# Patient Record
Sex: Female | Born: 1995 | Race: White | Hispanic: No | Smoking: Never smoker
Health system: Southern US, Community
[De-identification: ages and names within clinical notes are randomized; demographics above are authoritative.]

## PROBLEM LIST (undated history)

## (undated) ENCOUNTER — Inpatient Hospital Stay (HOSPITAL_COMMUNITY): Payer: Medicaid Other | Admitting: Obstetrics & Gynecology

## (undated) DIAGNOSIS — M419 Scoliosis, unspecified: Secondary | ICD-10-CM

## (undated) DIAGNOSIS — N200 Calculus of kidney: Secondary | ICD-10-CM

## (undated) DIAGNOSIS — Z3490 Encounter for supervision of normal pregnancy, unspecified, unspecified trimester: Secondary | ICD-10-CM

## (undated) DIAGNOSIS — O039 Complete or unspecified spontaneous abortion without complication: Secondary | ICD-10-CM

## (undated) HISTORY — DX: Scoliosis, unspecified: M41.9

## (undated) HISTORY — DX: Complete or unspecified spontaneous abortion without complication: O03.9

## (undated) HISTORY — PX: NO PAST SURGERIES: SHX2092

## (undated) HISTORY — DX: Encounter for supervision of normal pregnancy, unspecified, unspecified trimester: Z34.90

---
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
## 2015-01-01 ENCOUNTER — Telehealth: Payer: Self-pay

## 2015-01-01 ENCOUNTER — Encounter: Payer: Self-pay | Admitting: Family Medicine

## 2015-01-01 ENCOUNTER — Ambulatory Visit (INDEPENDENT_AMBULATORY_CARE_PROVIDER_SITE_OTHER): Payer: Medicaid Other | Admitting: Family Medicine

## 2015-01-01 VITALS — BP 116/78 | Temp 98.3°F | Ht 60.5 in | Wt 170.1 lb

## 2015-01-01 DIAGNOSIS — N926 Irregular menstruation, unspecified: Secondary | ICD-10-CM

## 2015-01-01 DIAGNOSIS — B359 Dermatophytosis, unspecified: Secondary | ICD-10-CM | POA: Diagnosis not present

## 2015-01-01 DIAGNOSIS — L739 Follicular disorder, unspecified: Secondary | ICD-10-CM

## 2015-01-01 MED ORDER — KETOCONAZOLE 2 % EX CREA
1.0000 | TOPICAL_CREAM | Freq: Two times a day (BID) | CUTANEOUS | Status: AC
Start: 2015-01-01 — End: 2015-01-22

## 2015-01-01 MED ORDER — NAPROXEN 500 MG PO TABS: 500.0000 mg | ORAL_TABLET | Freq: Two times a day (BID) | ORAL | Status: DC

## 2015-01-01 NOTE — Progress Notes (Signed)
   Subjective:    Patient ID: Jody Hansen, female    DOB: Mar 18, 1995, 19 y.o.   MRN: [id number]  Rash This is a recurrent problem. The current episode started more than 1 month ago. The problem has been gradually improving since onset. The affected locations include the left arm. Treatments tried: Hydrocortisone cream. The treatment provided mild relief.   Relates irregular cycles trying to get pregnant Patient would like to discuss pain to back of head, and swelling. She states it did drain a little bit but now seems to be getting better Review of Systems  Skin: Positive for rash.   Patient with some impetigo/folliculitis areas on the back of her head    Objective:   Physical Exam Tinea left elbow lungs clear heart regular back of the head shows healing impetigo no other particular troubles seen   Temperature prediction for ablation discuss with the patient chart given    Assessment & Plan:  Patient relates irregular cycles. I recommend that the patient keep track of her cycles follow-up for female examination patients does not think she has any type of pelvic infection   Patient with impetigo on the back of the head seems to be healing well without medication no need for intervention currently  Patient with tinea daily, Zoloft prescribed

## 2015-01-05 ENCOUNTER — Ambulatory Visit: Payer: Medicaid Other | Admitting: Pediatrics

## 2015-01-19 NOTE — Telephone Encounter (Signed)
error 

## 2015-01-23 ENCOUNTER — Ambulatory Visit (INDEPENDENT_AMBULATORY_CARE_PROVIDER_SITE_OTHER): Payer: Medicaid Other | Admitting: Adult Health

## 2015-01-23 ENCOUNTER — Encounter: Payer: Self-pay | Admitting: Adult Health

## 2015-01-23 VITALS — BP 122/80 | HR 86 | Ht 61.0 in | Wt 172.0 lb

## 2015-01-23 DIAGNOSIS — O3680X0 Pregnancy with inconclusive fetal viability, not applicable or unspecified: Secondary | ICD-10-CM

## 2015-01-23 DIAGNOSIS — N926 Irregular menstruation, unspecified: Secondary | ICD-10-CM

## 2015-01-23 DIAGNOSIS — Z3201 Encounter for pregnancy test, result positive: Secondary | ICD-10-CM

## 2015-01-23 DIAGNOSIS — Z3400 Encounter for supervision of normal first pregnancy, unspecified trimester: Secondary | ICD-10-CM | POA: Insufficient documentation

## 2015-01-23 DIAGNOSIS — Z3490 Encounter for supervision of normal pregnancy, unspecified, unspecified trimester: Secondary | ICD-10-CM

## 2015-01-23 HISTORY — DX: Encounter for supervision of normal pregnancy, unspecified, unspecified trimester: Z34.90

## 2015-01-23 LAB — POCT URINE PREGNANCY: PREG TEST UR: POSITIVE — AB

## 2015-01-23 MED ORDER — CITRANATAL 90 DHA 90-1 & 300 MG PO MISC: ORAL | Status: DC

## 2015-01-23 NOTE — Patient Instructions (Signed)
First Trimester of Pregnancy The first trimester of pregnancy is from week 1 until the end of week 12 (months 1 through 3). A week after a sperm fertilizes an egg, the egg will implant on the wall of the uterus. This embryo will begin to develop into a baby. Genes from you and your partner are forming the baby. The female genes determine whether the baby is a boy or a girl. At 6-8 weeks, the eyes and face are formed, and the heartbeat can be seen on ultrasound. At the end of 12 weeks, all the baby's organs are formed.  Now that you are pregnant, you will want to do everything you can to have a healthy baby. Two of the most important things are to get good prenatal care and to follow your health care provider's instructions. Prenatal care is all the medical care you receive before the baby's birth. This care will help prevent, find, and treat any problems during the pregnancy and childbirth. BODY CHANGES Your body goes through many changes during pregnancy. The changes vary from woman to woman.   You may gain or lose a couple of pounds at first.  You may feel sick to your stomach (nauseous) and throw up (vomit). If the vomiting is uncontrollable, call your health care provider.  You may tire easily.  You may develop headaches that can be relieved by medicines approved by your health care provider.  You may urinate more often. Painful urination may mean you have a bladder infection.  You may develop heartburn as a result of your pregnancy.  You may develop constipation because certain hormones are causing the muscles that push waste through your intestines to slow down.  You may develop hemorrhoids or swollen, bulging veins (varicose veins).  Your breasts may begin to grow larger and become tender. Your nipples may stick out more, and the tissue that surrounds them (areola) may become darker.  Your gums may bleed and may be sensitive to brushing and flossing.  Dark spots or blotches (chloasma,  mask of pregnancy) may develop on your face. This will likely fade after the baby is born.  Your menstrual periods will stop.  You may have a loss of appetite.  You may develop cravings for certain kinds of food.  You may have changes in your emotions from day to day, such as being excited to be pregnant or being concerned that something may go wrong with the pregnancy and baby.  You may have more vivid and strange dreams.  You may have changes in your hair. These can include thickening of your hair, rapid growth, and changes in texture. Some women also have hair loss during or after pregnancy, or hair that feels dry or thin. Your hair will most likely return to normal after your baby is born. WHAT TO EXPECT AT YOUR PRENATAL VISITS During a routine prenatal visit:  You will be weighed to make sure you and the baby are growing normally.  Your blood pressure will be taken.  Your abdomen will be measured to track your baby's growth.  The fetal heartbeat will be listened to starting around week 10 or 12 of your pregnancy.  Test results from any previous visits will be discussed. Your health care provider may ask you:  How you are feeling.  If you are feeling the baby move.  If you have had any abnormal symptoms, such as leaking fluid, bleeding, severe headaches, or abdominal cramping.  If you are using any tobacco products,   including cigarettes, chewing tobacco, and electronic cigarettes.  If you have any questions. Other tests that may be performed during your first trimester include:  Blood tests to find your blood type and to check for the presence of any previous infections. They will also be used to check for low iron levels (anemia) and Rh antibodies. Later in the pregnancy, blood tests for diabetes will be done along with other tests if problems develop.  Urine tests to check for infections, diabetes, or protein in the urine.  An ultrasound to confirm the proper growth  and development of the baby.  An amniocentesis to check for possible genetic problems.  Fetal screens for spina bifida and Down syndrome.  You may need other tests to make sure you and the baby are doing well.  HIV (human immunodeficiency virus) testing. Routine prenatal testing includes screening for HIV, unless you choose not to have this test. HOME CARE INSTRUCTIONS  Medicines  Follow your health care provider's instructions regarding medicine use. Specific medicines may be either safe or unsafe to take during pregnancy.  Take your prenatal vitamins as directed.  If you develop constipation, try taking a stool softener if your health care provider approves. Diet  Eat regular, well-balanced meals. Choose a variety of foods, such as meat or vegetable-based protein, fish, milk and low-fat dairy products, vegetables, fruits, and whole grain breads and cereals. Your health care provider will help you determine the amount of weight gain that is right for you.  Avoid raw meat and uncooked cheese. These carry germs that can cause birth defects in the baby.  Eating four or five small meals rather than three large meals a day may help relieve nausea and vomiting. If you start to feel nauseous, eating a few soda crackers can be helpful. Drinking liquids between meals instead of during meals also seems to help nausea and vomiting.  If you develop constipation, eat more high-fiber foods, such as fresh vegetables or fruit and whole grains. Drink enough fluids to keep your urine clear or pale yellow. Activity and Exercise  Exercise only as directed by your health care provider. Exercising will help you:  Control your weight.  Stay in shape.  Be prepared for labor and delivery.  Experiencing pain or cramping in the lower abdomen or low back is a good sign that you should stop exercising. Check with your health care provider before continuing normal exercises.  Try to avoid standing for long  periods of time. Move your legs often if you must stand in one place for a long time.  Avoid heavy lifting.  Wear low-heeled shoes, and practice good posture.  You may continue to have sex unless your health care provider directs you otherwise. Relief of Pain or Discomfort  Wear a good support bra for breast tenderness.   Take warm sitz baths to soothe any pain or discomfort caused by hemorrhoids. Use hemorrhoid cream if your health care provider approves.   Rest with your legs elevated if you have leg cramps or low back pain.  If you develop varicose veins in your legs, wear support hose. Elevate your feet for 15 minutes, 3-4 times a day. Limit salt in your diet. Prenatal Care  Schedule your prenatal visits by the twelfth week of pregnancy. They are usually scheduled monthly at first, then more often in the last 2 months before delivery.  Write down your questions. Take them to your prenatal visits.  Keep all your prenatal visits as directed by your   health care provider. Safety  Wear your seat belt at all times when driving.  Make a list of emergency phone numbers, including numbers for family, friends, the hospital, and police and fire departments. General Tips  Ask your health care provider for a referral to a local prenatal education class. Begin classes no later than at the beginning of month 6 of your pregnancy.  Ask for help if you have counseling or nutritional needs during pregnancy. Your health care provider can offer advice or refer you to specialists for help with various needs.  Do not use hot tubs, steam rooms, or saunas.  Do not douche or use tampons or scented sanitary pads.  Do not cross your legs for long periods of time.  Avoid cat litter boxes and soil used by cats. These carry germs that can cause birth defects in the baby and possibly loss of the fetus by miscarriage or stillbirth.  Avoid all smoking, herbs, alcohol, and medicines not prescribed by  your health care provider. Chemicals in these affect the formation and growth of the baby.  Do not use any tobacco products, including cigarettes, chewing tobacco, and electronic cigarettes. If you need help quitting, ask your health care provider. You may receive counseling support and other resources to help you quit.  Schedule a dentist appointment. At home, brush your teeth with a soft toothbrush and be gentle when you floss. SEEK MEDICAL CARE IF:   You have dizziness.  You have mild pelvic cramps, pelvic pressure, or nagging pain in the abdominal area.  You have persistent nausea, vomiting, or diarrhea.  You have a bad smelling vaginal discharge.  You have pain with urination.  You notice increased swelling in your face, hands, legs, or ankles. SEEK IMMEDIATE MEDICAL CARE IF:   You have a fever.  You are leaking fluid from your vagina.  You have spotting or bleeding from your vagina.  You have severe abdominal cramping or pain.  You have rapid weight gain or loss.  You vomit blood or material that looks like coffee grounds.  You are exposed to [place] measles and have never had them.  You are exposed to fifth disease or chickenpox.  You develop a severe headache.  You have shortness of breath.  You have any kind of trauma, such as from a fall or a car accident.   This information is not intended to replace advice given to you by your health care provider. Make sure you discuss any questions you have with your health care provider.   Document Released: 02/15/2001 Document Revised: 03/14/2014 Document Reviewed: 01/01/2013 Elsevier Interactive Patient Education [employer]. Return in 2 weeks for dating [place] Take prenatals

## 2015-01-23 NOTE — Progress Notes (Signed)
Subjective:     Patient ID: Jody Hansen, female   DOB: 05/02/1995, 19 y.o.   MRN: [id number]  HPI Jody Hansen is a 19 year old white female,in for having missed a period and had +HPT,has some cramping,no bleeding or nausea or vomiting,but breasts are tender.She wants confirming UPT.  Review of Systems Patient denies any headaches, hearing loss, fatigue, blurred vision, shortness of breath, chest pain,  problems with bowel movements, urination, or intercourse. No joint pain or mood swings. See HPI for positives. Reviewed past medical,surgical, social and family history. Reviewed medications and allergies.     Objective:   Physical Exam BP 122/80 mmHg  Pulse 86  Ht 5\' 1"  (1.549 m)  Wt 172 lb (78.019 kg)  BMI 32.52 kg/m2  LMP 12/16/2014 UPT + about 5+3 weeks by LMP with EDD 09/22/15, medicaid form given, Skin warm and dry. Neck: mid line trachea, normal thyroid, good ROM, no lymphadenopathy noted. Lungs: clear to ausculation bilaterally. Cardiovascular: regular rate and rhythm.Abdomen soft and non tender.   Has rash inner left elbow ?ring worm has cream per Dr Wolfgang Phoenix.  Assessment:     Pregnant     Plan:     Rx citranatal 90 DHA #60 take 1 bid with 12 refills Return in 2 weeks for dating [place] Review handout on first trimester

## 2015-01-27 ENCOUNTER — Ambulatory Visit: Payer: Medicaid Other | Admitting: Nurse Practitioner

## 2015-02-06 ENCOUNTER — Ambulatory Visit (INDEPENDENT_AMBULATORY_CARE_PROVIDER_SITE_OTHER): Payer: Medicaid Other

## 2015-02-06 DIAGNOSIS — O3680X0 Pregnancy with inconclusive fetal viability, not applicable or unspecified: Secondary | ICD-10-CM | POA: Diagnosis not present

## 2015-02-06 DIAGNOSIS — Z3A01 Less than 8 weeks gestation of pregnancy: Secondary | ICD-10-CM | POA: Diagnosis not present

## 2015-02-06 DIAGNOSIS — O3481 Maternal care for other abnormalities of pelvic organs, first trimester: Secondary | ICD-10-CM | POA: Diagnosis not present

## 2015-02-06 NOTE — Progress Notes (Signed)
[place] 7+3wks,single IUP w/ys,pos fht 148bpm,crl 13.24mm,normal rt ov,lt ov simple corpus luteal cyst 2 x 2 x 1.6cm

## 2015-02-11 ENCOUNTER — Encounter: Payer: Self-pay | Admitting: Women's Health

## 2015-02-11 ENCOUNTER — Ambulatory Visit (INDEPENDENT_AMBULATORY_CARE_PROVIDER_SITE_OTHER): Payer: Medicaid Other | Admitting: Women's Health

## 2015-02-11 VITALS — BP 100/68 | HR 68 | Wt 174.0 lb

## 2015-02-11 DIAGNOSIS — Z369 Encounter for antenatal screening, unspecified: Secondary | ICD-10-CM

## 2015-02-11 DIAGNOSIS — Z3682 Encounter for antenatal screening for nuchal translucency: Secondary | ICD-10-CM

## 2015-02-11 DIAGNOSIS — Z331 Pregnant state, incidental: Secondary | ICD-10-CM

## 2015-02-11 DIAGNOSIS — Z3401 Encounter for supervision of normal first pregnancy, first trimester: Secondary | ICD-10-CM

## 2015-02-11 DIAGNOSIS — Z1389 Encounter for screening for other disorder: Secondary | ICD-10-CM

## 2015-02-11 DIAGNOSIS — Z0372 Encounter for suspected placental problem ruled out: Secondary | ICD-10-CM

## 2015-02-11 DIAGNOSIS — O210 Mild hyperemesis gravidarum: Secondary | ICD-10-CM

## 2015-02-11 DIAGNOSIS — Z3A01 Less than 8 weeks gestation of pregnancy: Secondary | ICD-10-CM

## 2015-02-11 LAB — POCT URINALYSIS DIPSTICK
Blood, UA: NEGATIVE
Glucose, UA: NEGATIVE
KETONES UA: NEGATIVE
Leukocytes, UA: NEGATIVE
Nitrite, UA: NEGATIVE
PROTEIN UA: NEGATIVE

## 2015-02-11 MED ORDER — DOXYLAMINE-PYRIDOXINE 10-10 MG PO TBEC: DELAYED_RELEASE_TABLET | ORAL | Status: DC

## 2015-02-11 NOTE — Progress Notes (Addendum)
  Subjective:  Jody Hansen is a 19 y.o. G55P0 Caucasian female at [redacted]w[redacted]d by LMP c/w 7wk u/s, being seen today for her first obstetrical visit.  Her obstetrical history is significant for teen primigravida.  Pregnancy history fully reviewed.  Patient reports nausea- no vomiting- requests meds. Denies vb, cramping, uti s/s, abnormal/malodorous vag d/c, or vulvovaginal itching/irritation.  BP 100/68 mmHg  Pulse 68  Wt 174 lb (78.926 kg)  LMP 12/16/2014 (Exact Date)  HISTORY: OB History  Gravida Para Term Preterm AB SAB TAB Ectopic Multiple Living  1             # Outcome Date GA Lbr Len/2nd Weight Sex Delivery Anes PTL Lv  1 Current              Past Medical History  Diagnosis Date  . Scoliosis   . Pregnant 01/23/2015   Past Surgical History  Procedure Laterality Date  . No past surgeries     Family History  Problem Relation Age of Onset  . Diabetes Paternal Grandfather   . Diabetes Paternal Grandmother   . Cancer Maternal Grandmother   . Cancer Maternal Grandfather   . Diabetes Father   . Diabetes Mother   . Diabetes Sister     borderline    Exam   System:     General: Well developed & nourished, no acute distress   Skin: Warm & dry, normal coloration and turgor, no rashes   Neurologic: Alert & oriented, normal mood   Cardiovascular: Regular rate & rhythm   Respiratory: Effort & rate normal, LCTAB, acyanotic   Abdomen: Soft, non tender   Extremities: normal strength, tone  Thin prep pap smear <21yo    Assessment:   Pregnancy: G1P0 Patient Active Problem List   Diagnosis Date Noted  . Supervision of normal first pregnancy 01/23/2015    Priority: High  . Backache 07/03/2012  . Acne 07/03/2012  . Renal colic on right side [id number]    [redacted]w[redacted]d G1P0 New OB visit Teen primigravida Nausea of pregnancy  Plan:  Initial labs drawn Continue prenatal vitamins Problem list reviewed and updated Reviewed n/v relief measures and warning s/s to report Rx  diclegis, prior auth approved through [employer] today Reviewed recommended weight gain based on pre-gravid BMI Encouraged well-balanced diet Genetic Screening discussed Integrated Screen: requested Cystic fibrosis screening discussed declined Ultrasound discussed; fetal survey: requested Follow up in 4 weeks for 1st it/nt and visit [place] completed NFPartnership offered, accepted, referral faxed  Recommended flu shot w/ pcp/hd (<21yo)   Tawnya Crook CNM, [facility] 02/11/2015 2:40 PM

## 2015-02-11 NOTE — Patient Instructions (Signed)

## 2015-02-12 LAB — GC/CHLAMYDIA PROBE AMP
Chlamydia trachomatis, NAA: NEGATIVE
Neisseria gonorrhoeae by PCR: NEGATIVE

## 2015-02-12 LAB — URINE CULTURE: Organism ID, Bacteria: NO GROWTH

## 2015-02-13 LAB — HEPATITIS B SURFACE ANTIGEN: HEP B S AG: NEGATIVE

## 2015-02-13 LAB — URINALYSIS, ROUTINE W REFLEX MICROSCOPIC
Bilirubin, UA: NEGATIVE
GLUCOSE, UA: NEGATIVE
Ketones, UA: NEGATIVE
Leukocytes, UA: NEGATIVE
Nitrite, UA: NEGATIVE
PROTEIN UA: NEGATIVE
RBC, UA: NEGATIVE
Specific Gravity, UA: 1.024 (ref 1.005–1.030)
UUROB: 0.2 mg/dL (ref 0.2–1.0)
pH, UA: 7 (ref 5.0–7.5)

## 2015-02-13 LAB — PMP SCREEN PROFILE (10S), URINE
AMPHETAMINE SCRN UR: NEGATIVE ng/mL
Barbiturate Screen, Ur: NEGATIVE ng/mL
Benzodiazepine Screen, Urine: NEGATIVE ng/mL
COCAINE(METAB.) SCREEN, URINE: NEGATIVE ng/mL
CREATININE(CRT), U: 125.2 mg/dL (ref 20.0–300.0)
Cannabinoids Ur Ql Scn: NEGATIVE ng/mL
METHADONE SCREEN, URINE: NEGATIVE ng/mL
Opiate Scrn, Ur: NEGATIVE ng/mL
Oxycodone+Oxymorphone Ur Ql Scn: NEGATIVE ng/mL
PCP SCRN UR: NEGATIVE ng/mL
PROPOXYPHENE SCREEN: NEGATIVE ng/mL
Ph of Urine: 7.1 (ref 4.5–8.9)

## 2015-02-13 LAB — CBC
HEMOGLOBIN: 13.2 g/dL (ref 11.1–15.9)
Hematocrit: 39 % (ref 34.0–46.6)
MCH: 31.1 pg (ref 26.6–33.0)
MCHC: 33.8 g/dL (ref 31.5–35.7)
MCV: 92 fL (ref 79–97)
PLATELETS: 265 10*3/uL (ref 150–379)
RBC: 4.25 x10E6/uL (ref 3.77–5.28)
RDW: 13.3 % (ref 12.3–15.4)
WBC: 10.4 10*3/uL (ref 3.4–10.8)

## 2015-02-13 LAB — ABO/RH: Rh Factor: POSITIVE

## 2015-02-13 LAB — SICKLE CELL SCREEN: Sickle Cell Screen: NEGATIVE

## 2015-02-13 LAB — RPR: RPR Ser Ql: NONREACTIVE

## 2015-02-13 LAB — ANTIBODY SCREEN: ANTIBODY SCREEN: NEGATIVE

## 2015-02-13 LAB — HIV ANTIBODY (ROUTINE TESTING W REFLEX): HIV Screen 4th Generation wRfx: NONREACTIVE

## 2015-02-13 LAB — RUBELLA SCREEN: RUBELLA: 2.03 {index} (ref >0.99)

## 2015-02-13 LAB — VARICELLA ZOSTER ANTIBODY, IGG

## 2015-02-17 ENCOUNTER — Encounter: Payer: Self-pay | Admitting: Women's Health

## 2015-02-17 DIAGNOSIS — Z2839 Other underimmunization status: Secondary | ICD-10-CM | POA: Insufficient documentation

## 2015-02-17 DIAGNOSIS — Z283 Underimmunization status: Secondary | ICD-10-CM

## 2015-02-17 DIAGNOSIS — O09899 Supervision of other high risk pregnancies, unspecified trimester: Secondary | ICD-10-CM | POA: Insufficient documentation

## 2015-03-08 DIAGNOSIS — N200 Calculus of kidney: Secondary | ICD-10-CM

## 2015-03-08 HISTORY — DX: Calculus of kidney: N20.0

## 2015-03-08 NOTE — L&D Delivery Note (Signed)
  Delivery Note At 7:06 PM a viable female was delivered via Vaginal, Spontaneous Delivery (Presentation: Left Occiput Anterior).  APGAR: 4, 7; weight 7 lb 9.5 oz (3445 g).   Placenta status: Intact, Spontaneous.  Cord: 3 vessels with the following complications: None.  Cord pH: pending  Mother came in spontaneous labor. Did have SROM and then AROM of forebag with IUPC placement and augmentation with pitocin.   Code APGAR was called but baby responded to dry, suction and stim.   Anesthesia: Epidural  Episiotomy: None Lacerations: 1st degree;Labial Suture Repair: 3.0 vicryl Est. Blood Loss (mL): 200  Mom to postpartum.  Baby to Couplet care / Skin to Skin.  Jacquiline Doe 09/19/2015, 7:51 PM

## 2015-03-11 ENCOUNTER — Encounter: Payer: Self-pay | Admitting: Women's Health

## 2015-03-11 ENCOUNTER — Ambulatory Visit (INDEPENDENT_AMBULATORY_CARE_PROVIDER_SITE_OTHER): Payer: Medicaid Other

## 2015-03-11 ENCOUNTER — Ambulatory Visit (INDEPENDENT_AMBULATORY_CARE_PROVIDER_SITE_OTHER): Payer: Medicaid Other | Admitting: Women's Health

## 2015-03-11 ENCOUNTER — Encounter: Payer: Medicaid Other | Admitting: Women's Health

## 2015-03-11 VITALS — BP 110/66 | HR 64 | Wt 176.0 lb

## 2015-03-11 DIAGNOSIS — Z3401 Encounter for supervision of normal first pregnancy, first trimester: Secondary | ICD-10-CM

## 2015-03-11 DIAGNOSIS — Z36 Encounter for antenatal screening of mother: Secondary | ICD-10-CM

## 2015-03-11 DIAGNOSIS — Z331 Pregnant state, incidental: Secondary | ICD-10-CM

## 2015-03-11 DIAGNOSIS — Z3682 Encounter for antenatal screening for nuchal translucency: Secondary | ICD-10-CM

## 2015-03-11 DIAGNOSIS — Z1389 Encounter for screening for other disorder: Secondary | ICD-10-CM

## 2015-03-11 LAB — POCT URINALYSIS DIPSTICK
GLUCOSE UA: NEGATIVE
Ketones, UA: NEGATIVE
LEUKOCYTES UA: NEGATIVE
NITRITE UA: NEGATIVE
Protein, UA: NEGATIVE
RBC UA: NEGATIVE

## 2015-03-11 MED ORDER — OB COMPLETE PETITE 35-5-1-200 MG PO CAPS: 1.0000 | ORAL_CAPSULE | Freq: Every day | ORAL | Status: DC

## 2015-03-11 NOTE — Progress Notes (Signed)
[place] 12+1wks measurements c/w dates,crl 60.2mm,NB present,NT 1.62mm,normal ov's bilat,post pl gr 0

## 2015-03-11 NOTE — Patient Instructions (Signed)

## 2015-03-11 NOTE — Progress Notes (Signed)
Low-risk OB appointment G1P0 [redacted]w[redacted]d Estimated Date of Delivery: 09/22/15 BP 110/66 mmHg  Pulse 64  Wt 176 lb (79.833 kg)  LMP 12/16/2014 (Exact Date)  BP, weight, and urine reviewed.  Refer to obstetrical flow sheet for FH & FHR.  No fm yet. Denies cramping, lof, vb, or uti s/s. Can't swallow pnv- offered rx for small pnv vs otc gummies- wants rx- ob petite rx sent Reviewed today's normal nt u/s, warning s/s to report. Plan:  Continue routine obstetrical care  F/U in 4wks for OB appointment and 2nd IT 1st IT/NT today

## 2015-03-13 LAB — MATERNAL SCREEN, INTEGRATED #1
CROWN RUMP LENGTH MAT SCREEN: 60.5 mm
GEST. AGE ON COLLECTION DATE: 12.4 wk
MATERNAL AGE AT EDD: 20.1 a
NUMBER OF FETUSES: 1
Nuchal Translucency (NT): 1.7 mm
PAPP-A VALUE: 1063.7 ng/mL
WEIGHT: 176 [lb_av]

## 2015-04-08 ENCOUNTER — Encounter: Payer: Self-pay | Admitting: Advanced Practice Midwife

## 2015-04-08 ENCOUNTER — Ambulatory Visit (INDEPENDENT_AMBULATORY_CARE_PROVIDER_SITE_OTHER): Payer: Medicaid Other | Admitting: Advanced Practice Midwife

## 2015-04-08 VITALS — BP 100/60 | HR 88 | Wt 177.0 lb

## 2015-04-08 DIAGNOSIS — Z3A16 16 weeks gestation of pregnancy: Secondary | ICD-10-CM

## 2015-04-08 DIAGNOSIS — Z3682 Encounter for antenatal screening for nuchal translucency: Secondary | ICD-10-CM

## 2015-04-08 DIAGNOSIS — Z3402 Encounter for supervision of normal first pregnancy, second trimester: Secondary | ICD-10-CM

## 2015-04-08 DIAGNOSIS — Z363 Encounter for antenatal screening for malformations: Secondary | ICD-10-CM

## 2015-04-08 DIAGNOSIS — Z331 Pregnant state, incidental: Secondary | ICD-10-CM

## 2015-04-08 DIAGNOSIS — Z1389 Encounter for screening for other disorder: Secondary | ICD-10-CM

## 2015-04-08 LAB — POCT URINALYSIS DIPSTICK
Glucose, UA: NEGATIVE
Ketones, UA: NEGATIVE
Leukocytes, UA: NEGATIVE
NITRITE UA: NEGATIVE
Protein, UA: NEGATIVE

## 2015-04-08 NOTE — Progress Notes (Signed)
G1P0 [redacted]w[redacted]d Estimated Date of Delivery: 09/22/15  Blood pressure 100/60, pulse 88, weight 177 lb (80.287 kg), last menstrual period 12/16/2014.   BP weight and urine results all reviewed and noted.  Please refer to the obstetrical flow sheet for the fundal height and fetal heart rate documentation:  Patient denies any bleeding and no rupture of membranes symptoms or regular contractions. Patient is without complaints. All questions were answered.  Orders Placed This Encounter  Procedures  . US OB Comp + 14 Wk  . Maternal Screen, Integrated #2  . POCT urinalysis dipstick    Plan:  Continued routine obstetrical care, 2nd IT  Return in about 3 weeks (around 04/29/2015) for [place], [id number].

## 2015-04-08 NOTE — Patient Instructions (Signed)

## 2015-04-10 LAB — MATERNAL SCREEN, INTEGRATED #2
AFP MARKER: 22.9 ng/mL
AFP MOM: 0.8
CROWN RUMP LENGTH: 60.5 mm
DIA MoM: 0.59
DIA Value: 92.4 pg/mL
Estriol, Unconjugated: 1.19 ng/mL
GEST. AGE ON COLLECTION DATE: 12.4 wk
GESTATIONAL AGE: 16.4 wk
HCG VALUE: 29.6 [IU]/mL
MATERNAL AGE AT EDD: 20.1 a
Nuchal Translucency (NT): 1.7 mm
Nuchal Translucency MoM: 1.1
Number of Fetuses: 1
PAPP-A MoM: 1.36
PAPP-A VALUE: 1063.7 ng/mL
TEST RESULTS: NEGATIVE
WEIGHT: 176 [lb_av]
Weight: 176 [lb_av]
hCG MoM: 1.01
uE3 MoM: 1.33

## 2015-04-30 ENCOUNTER — Ambulatory Visit (INDEPENDENT_AMBULATORY_CARE_PROVIDER_SITE_OTHER): Payer: Medicaid Other | Admitting: Advanced Practice Midwife

## 2015-04-30 ENCOUNTER — Ambulatory Visit (INDEPENDENT_AMBULATORY_CARE_PROVIDER_SITE_OTHER): Payer: Medicaid Other

## 2015-04-30 VITALS — BP 130/64 | HR 90 | Wt 181.0 lb

## 2015-04-30 DIAGNOSIS — Z36 Encounter for antenatal screening of mother: Secondary | ICD-10-CM

## 2015-04-30 DIAGNOSIS — Z3402 Encounter for supervision of normal first pregnancy, second trimester: Secondary | ICD-10-CM

## 2015-04-30 DIAGNOSIS — Z363 Encounter for antenatal screening for malformations: Secondary | ICD-10-CM

## 2015-04-30 DIAGNOSIS — Z3A20 20 weeks gestation of pregnancy: Secondary | ICD-10-CM

## 2015-04-30 DIAGNOSIS — Z1389 Encounter for screening for other disorder: Secondary | ICD-10-CM

## 2015-04-30 DIAGNOSIS — Z331 Pregnant state, incidental: Secondary | ICD-10-CM

## 2015-04-30 LAB — POCT URINALYSIS DIPSTICK
Glucose, UA: NEGATIVE
Ketones, UA: NEGATIVE
Leukocytes, UA: NEGATIVE
NITRITE UA: NEGATIVE
Protein, UA: NEGATIVE

## 2015-04-30 NOTE — Progress Notes (Addendum)
G1P0 [redacted]w[redacted]d Estimated Date of Delivery: 09/22/15  Blood pressure 130/64, pulse 90, weight 181 lb (82.101 kg), last menstrual period 12/16/2014.   BP weight and urine results all reviewed and noted.  Please refer to the obstetrical flow sheet for the fundal height and fetal heart rate documentation:US 19+2 wks,cx 3.2 cm,post pl gr 0,normal ov's bilat,svp of fluid 4.5cm,fhr 149 bpm,efw 327 g,measurements c/w dates, Patient reports good fetal movement, denies any bleeding and no rupture of membranes symptoms or regular contractions. Patient is without complaints. All questions were answered.  Orders Placed This Encounter  Procedures  . POCT urinalysis dipstick    Plan:  Continued routine obstetrical care,   Return in about 4 weeks (around 05/28/2015) for LROB.

## 2015-04-30 NOTE — Progress Notes (Signed)
[place] 19+2 wks,cx 3.2 cm,post pl gr 0,normal ov's bilat,svp of fluid 4.5cm,fhr 149 bpm,efw 327 g,measurements c/w dates,anatomy complete,no obvious abn seen

## 2015-05-28 ENCOUNTER — Ambulatory Visit (INDEPENDENT_AMBULATORY_CARE_PROVIDER_SITE_OTHER): Payer: Medicaid Other | Admitting: Advanced Practice Midwife

## 2015-05-28 VITALS — BP 108/66 | Wt 188.5 lb

## 2015-05-28 DIAGNOSIS — Z3402 Encounter for supervision of normal first pregnancy, second trimester: Secondary | ICD-10-CM

## 2015-05-28 DIAGNOSIS — Z363 Encounter for antenatal screening for malformations: Secondary | ICD-10-CM

## 2015-05-28 DIAGNOSIS — Z1389 Encounter for screening for other disorder: Secondary | ICD-10-CM

## 2015-05-28 DIAGNOSIS — Z331 Pregnant state, incidental: Secondary | ICD-10-CM

## 2015-05-28 LAB — POCT URINALYSIS DIPSTICK
Blood, UA: 1
GLUCOSE UA: NEGATIVE
Ketones, UA: NEGATIVE
LEUKOCYTES UA: NEGATIVE
Nitrite, UA: NEGATIVE

## 2015-05-28 NOTE — Progress Notes (Signed)
G1P0 [redacted]w[redacted]d Estimated Date of Delivery: 09/22/15  Blood pressure 108/66, weight 188 lb 8 oz (85.503 kg), last menstrual period 12/16/2014.   BP weight and urine results all reviewed and noted.  Please refer to the obstetrical flow sheet for the fundal height and fetal heart rate documentation:  Patient reports good fetal movement, denies any bleeding and no rupture of membranes symptoms or regular contractions. Patient is without complaints. All questions were answered.  Orders Placed This Encounter  Procedures  . US OB Follow Up  . POCT urinalysis dipstick    Plan:  Continued routine obstetrical care,   Return in about 4 weeks (around 06/25/2015) for PN2/LROB, US:OB F/U:.

## 2015-05-28 NOTE — Patient Instructions (Signed)

## 2015-05-29 ENCOUNTER — Emergency Department (HOSPITAL_COMMUNITY)
Admission: EM | Admit: 2015-05-29 | Discharge: 2015-05-30 | Disposition: A | Discharge status: Home / Self Care | Payer: Medicaid Other | Attending: Emergency Medicine | Admitting: Emergency Medicine

## 2015-05-29 ENCOUNTER — Encounter (HOSPITAL_COMMUNITY): Payer: Self-pay | Admitting: Emergency Medicine

## 2015-05-29 DIAGNOSIS — N390 Urinary tract infection, site not specified: Secondary | ICD-10-CM

## 2015-05-29 DIAGNOSIS — R1031 Right lower quadrant pain: Secondary | ICD-10-CM | POA: Diagnosis not present

## 2015-05-29 DIAGNOSIS — Z3A23 23 weeks gestation of pregnancy: Secondary | ICD-10-CM | POA: Diagnosis not present

## 2015-05-29 DIAGNOSIS — R109 Unspecified abdominal pain: Secondary | ICD-10-CM

## 2015-05-29 DIAGNOSIS — O26892 Other specified pregnancy related conditions, second trimester: Secondary | ICD-10-CM | POA: Diagnosis present

## 2015-05-29 DIAGNOSIS — O2342 Unspecified infection of urinary tract in pregnancy, second trimester: Secondary | ICD-10-CM | POA: Diagnosis not present

## 2015-05-29 DIAGNOSIS — Z3490 Encounter for supervision of normal pregnancy, unspecified, unspecified trimester: Secondary | ICD-10-CM

## 2015-05-29 LAB — URINALYSIS, ROUTINE W REFLEX MICROSCOPIC
BILIRUBIN URINE: NEGATIVE
Glucose, UA: NEGATIVE mg/dL
Ketones, ur: NEGATIVE mg/dL
LEUKOCYTES UA: NEGATIVE
NITRITE: NEGATIVE
PH: 6.5 (ref 5.0–8.0)
Protein, ur: NEGATIVE mg/dL
SPECIFIC GRAVITY, URINE: 1.025 (ref 1.005–1.030)

## 2015-05-29 LAB — URINE MICROSCOPIC-ADD ON

## 2015-05-29 NOTE — ED Notes (Signed)
Pt is 23 weeks, 3 days pregnant. Pt was at work today and had onset of lower right back pain that radiates around to the lower right abdomen, reports as constant. Has also had several episodes of vomiting. Denies vaginal discharge or bleeding. Pt last saw her OBGYN yesterday, reports no pregnancy issues.

## 2015-05-29 NOTE — ED Notes (Signed)
Pt c/o rt flank pain that radiates to the abd.

## 2015-05-30 ENCOUNTER — Emergency Department (HOSPITAL_COMMUNITY): Payer: Medicaid Other

## 2015-05-30 LAB — COMPREHENSIVE METABOLIC PANEL
ALT: 12 U/L — ABNORMAL LOW (ref 14–54)
ANION GAP: 7 (ref 5–15)
AST: 18 U/L (ref 15–41)
Albumin: 3.3 g/dL — ABNORMAL LOW (ref 3.5–5.0)
Alkaline Phosphatase: 67 U/L (ref 38–126)
BUN: 11 mg/dL (ref 6–20)
CALCIUM: 8.5 mg/dL — AB (ref 8.9–10.3)
CHLORIDE: 107 mmol/L (ref 101–111)
CO2: 23 mmol/L (ref 22–32)
Creatinine, Ser: 0.82 mg/dL (ref 0.44–1.00)
GFR calc non Af Amer: >60 mL/min (ref >60)
Glucose, Bld: 108 mg/dL — ABNORMAL HIGH (ref 65–99)
Potassium: 4.2 mmol/L (ref 3.5–5.1)
SODIUM: 137 mmol/L (ref 135–145)
Total Bilirubin: 0.4 mg/dL (ref 0.3–1.2)
Total Protein: 7 g/dL (ref 6.5–8.1)

## 2015-05-30 LAB — CBC WITH DIFFERENTIAL/PLATELET
BASOS ABS: 0 10*3/uL (ref 0.0–0.1)
Basophils Relative: 0 %
EOS ABS: 0 10*3/uL (ref 0.0–0.7)
Eosinophils Relative: 0 %
HCT: 35.2 % — ABNORMAL LOW (ref 36.0–46.0)
HEMOGLOBIN: 12 g/dL (ref 12.0–15.0)
LYMPHS ABS: 1.2 10*3/uL (ref 0.7–4.0)
LYMPHS PCT: 7 %
MCH: 32.3 pg (ref 26.0–34.0)
MCHC: 34.1 g/dL (ref 30.0–36.0)
MCV: 94.9 fL (ref 78.0–100.0)
Monocytes Absolute: 0.8 10*3/uL (ref 0.1–1.0)
Monocytes Relative: 5 %
NEUTROS PCT: 88 %
Neutro Abs: 14 10*3/uL — ABNORMAL HIGH (ref 1.7–7.7)
Platelets: 216 10*3/uL (ref 150–400)
RBC: 3.71 MIL/uL — AB (ref 3.87–5.11)
RDW: 13.2 % (ref 11.5–15.5)
WBC: 16 10*3/uL — AB (ref 4.0–10.5)

## 2015-05-30 LAB — LIPASE, BLOOD: Lipase: 29 U/L (ref 11–51)

## 2015-05-30 MED ORDER — KETOROLAC TROMETHAMINE 30 MG/ML IJ SOLN
30.0000 mg | Freq: Once | INTRAMUSCULAR | Status: AC
  Administered 2015-05-30: 30 mg via INTRAVENOUS
  Filled 2015-05-30: qty 1

## 2015-05-30 MED ORDER — ONDANSETRON 8 MG PO TBDP
8.0000 mg | ORAL_TABLET | Freq: Once | ORAL | Status: AC
  Administered 2015-05-30: 8 mg via ORAL

## 2015-05-30 MED ORDER — NITROFURANTOIN MONOHYD MACRO 100 MG PO CAPS
100.0000 mg | ORAL_CAPSULE | Freq: Two times a day (BID) | ORAL | Status: DC
  Administered 2015-05-30: 100 mg via ORAL

## 2015-05-30 MED ORDER — ONDANSETRON 8 MG PO TBDP
ORAL_TABLET | ORAL | Status: AC
Start: 2015-05-30 — End: 2015-05-30
  Administered 2015-05-30: 8 mg via ORAL
  Filled 2015-05-30: qty 1

## 2015-05-30 MED ORDER — NITROFURANTOIN MONOHYD MACRO 100 MG PO CAPS: 100.0000 mg | ORAL_CAPSULE | Freq: Two times a day (BID) | ORAL | Status: DC

## 2015-05-30 NOTE — ED Provider Notes (Signed)
Care transferred to me. Ultrasound shows moderate right hydronephrosis that could be physiologic. Could also be a renal stone but patient states this feels different than prior ureteral colic. Questionable UTI versus contaminated sample, will discharge on Macrobid per prior plan. She feels better at this point and is okay with taking Tylenol at home as needed. Follow-up with OB and/or PCP.  Results for orders placed or performed during the hospital encounter of 05/29/15  Urinalysis, Routine w reflex microscopic (not at [facility])  Result Value Ref Range   Color, Urine YELLOW YELLOW   APPearance CLEAR CLEAR   Specific Gravity, Urine 1.025 1.005 - 1.030   pH 6.5 5.0 - 8.0   Glucose, UA NEGATIVE NEGATIVE mg/dL   Hgb urine dipstick SMALL (A) NEGATIVE   Bilirubin Urine NEGATIVE NEGATIVE   Ketones, ur NEGATIVE NEGATIVE mg/dL   Protein, ur NEGATIVE NEGATIVE mg/dL   Nitrite NEGATIVE NEGATIVE   Leukocytes, UA NEGATIVE NEGATIVE  Urine microscopic-add on  Result Value Ref Range   Squamous Epithelial / LPF 6-30 (A) NONE SEEN   WBC, UA 6-30 0 - 5 WBC/hpf   RBC / HPF 0-5 0 - 5 RBC/hpf   Bacteria, UA MANY (A) NONE SEEN  Comprehensive metabolic panel  Result Value Ref Range   Sodium 137 135 - 145 mmol/L   Potassium 4.2 3.5 - 5.1 mmol/L   Chloride 107 101 - 111 mmol/L   CO2 23 22 - 32 mmol/L   Glucose, Bld 108 (H) 65 - 99 mg/dL   BUN 11 6 - 20 mg/dL   Creatinine, Ser 0.82 0.44 - 1.00 mg/dL   Calcium 8.5 (L) 8.9 - 10.3 mg/dL   Total Protein 7.0 6.5 - 8.1 g/dL   Albumin 3.3 (L) 3.5 - 5.0 g/dL   AST 18 15 - 41 U/L   ALT 12 (L) 14 - 54 U/L   Alkaline Phosphatase 67 38 - 126 U/L   Total Bilirubin 0.4 0.3 - 1.2 mg/dL   GFR calc non Af Amer >60 >60 mL/min   GFR calc Af Amer >60 >60 mL/min   Anion gap 7 5 - 15  Lipase, blood  Result Value Ref Range   Lipase 29 11 - 51 U/L  CBC with Differential  Result Value Ref Range   WBC 16.0 (H) 4.0 - 10.5 K/uL   RBC 3.71 (L) 3.87 - 5.11 MIL/uL   Hemoglobin  12.0 12.0 - 15.0 g/dL   HCT 35.2 (L) 36.0 - 46.0 %   MCV 94.9 78.0 - 100.0 fL   MCH 32.3 26.0 - 34.0 pg   MCHC 34.1 30.0 - 36.0 g/dL   RDW 13.2 11.5 - 15.5 %   Platelets 216 150 - 400 K/uL   Neutrophils Relative % 88 %   Neutro Abs 14.0 (H) 1.7 - 7.7 K/uL   Lymphocytes Relative 7 %   Lymphs Abs 1.2 0.7 - 4.0 K/uL   Monocytes Relative 5 %   Monocytes Absolute 0.8 0.1 - 1.0 K/uL   Eosinophils Relative 0 %   Eosinophils Absolute 0.0 0.0 - 0.7 K/uL   Basophils Relative 0 %   Basophils Absolute 0.0 0.0 - 0.1 K/uL   US Abdomen Complete  05/30/2015  CLINICAL DATA:  RIGHT-sided abdominal pain. Twenty-three weeks pregnant. EXAM: ABDOMEN ULTRASOUND COMPLETE COMPARISON:  None. FINDINGS: Gallbladder: No gallstones or wall thickening visualized. No sonographic Murphy sign noted by sonographer. Common bile duct: Diameter: Normal at 3 mm Liver: No focal lesion identified. Within normal limits in parenchymal  echogenicity. IVC: Poorly visualized Pancreas: Poorly visualized Spleen: Size and appearance within normal limits. Right Kidney: Length: 8.9 cm. Moderate hydronephrosis and hydroureter Left Kidney: Length: 11.9 cm.  No hydronephrosis Abdominal aorta: No aneurysm visualized. Other findings: Moderate evaluated without abnormality. Ureteral jets not demonstrated. IMPRESSION: 1. Mild-to-moderate RIGHT hydronephrosis and proximal hydroureter. This likely represents physiologic hydronephrosis of pregnancy. 2. Ureteral jets not demonstrated within the bladder. 3. No gallstones or evidence of cholecystitis. Electronically Signed   By: Suzy Bouchard M.D.   On: 05/30/2015 09:30   US Ob Comp + 14 Wk  04/30/2015   SECOND TRIMESTER SONOGRAM Gwendolyne E Rippel is in the office for  second trimester sonogram. She is a 20 y.o. year old G1P0 with Estimated Date of Delivery: 09/22/15 by LMP now at  [redacted]w[redacted]d weeks gestation. Thus far the pregnancy has been uncomplicated. GESTATION: SINGLETON PRESENTATION: cephalic FETAL  ACTIVITY:          Heart rate         149 bpm          The fetus is active. AMNIOTIC FLUID: The amniotic fluid volume is  normal, SDP : 4.5 cm. PLACENTA LOCALIZATION:  posterior GRADE 0 CERVIX: Measures 3.2 cm ADNEXA: The ovaries are normal. GESTATIONAL AGE AND  BIOMETRICS: Gestational criteria: Estimated Date of Delivery: 09/22/15 by LMP now at [redacted]w[redacted]d Previous Scans:2          BIPARIETAL DIAMETER           4.33 cm         19+1 weeks HEAD CIRCUMFERENCE           16.26 cm         19 weeks ABDOMINAL CIRCUMFERENCE           15.14 cm         20+3 weeks FEMUR LENGTH           3.08 cm         19+4 weeks                                                       AVERAGE EGA(BY THIS SCAN):  19+4 weeks                                                 ESTIMATED FETAL WEIGHT:       327  grams ANATOMICAL SURVEY                                                                            COMMENTS CEREBRAL VENTRICLES yes normal  CHOROID PLEXUS yes normal  CEREBELLUM yes normal  CISTERNA MAGNA yes normal  NUCHAL REGION yes normal  ORBITS yes normal  NASAL BONE yes normal  NOSE/LIP yes normal  FACIAL PROFILE no Not well visualized  4 CHAMBERED HEART yes normal  OUTFLOW TRACTS yes normal  DIAPHRAGM yes normal  STOMACH yes normal  RENAL REGION yes normal  BLADDER yes normal  CORD INSERTION yes normal  3 VESSEL CORD yes normal  SPINE yes normal  ARMS/HANDS yes normal  LEGS/FEET yes normal  GENITALIA yes normal female     SUSPECTED ABNORMALITIES:  no QUALITY OF SCAN: Limited because of fetal pos and body habitus TECHNICIAN COMMENTS: [place] 19+2 wks,cx 3.2 cm,post pl gr 0,normal ov's bilat,svp of fluid 4.5cm,fhr 149 bpm,efw 327 g,measurements c/w dates,unable to adequately visualize face,no obvious abn seen, recommend follow up ultrasound to reevaluate face. A copy of this report including all images has been saved and backed up to a second source for retrieval if needed. All measures and details of the anatomical scan, placentation, fluid volume and  pelvic anatomy are contained in that report. Amber Heide Guile 04/30/2015 2:01 PM Clinical Impression and recommendations: I have reviewed the sonogram results above, combined with the patient's current clinical course, below are my impressions and any appropriate recommendations for management based on the sonographic findings. 1.  G1P0 Estimated Date of Delivery: 09/22/15 by  LMP, early ultrasound and confirmed by today's sonographic dating 2.  Normal fetal sonographic findings, specifically normal detailed anatomical evaluation,      no abnormalities noted suboptimal facial views, will repeat at 28 weeks to re evaluate 3.  Normal general sonographic findings Recommend routine prenatal care based on this sonogram or as clinically indicated EURE,LUTHER H 04/30/2015 4:06 PM   US Pelvis Limited  05/30/2015  CLINICAL DATA:  20 year old female currently [redacted] weeks pregnant with right-sided abdominal pain. Evaluate for cholelithiasis or nephrolithiasis EXAM: ABDOMEN ULTRASOUND COMPLETE COMPARISON:  Prior pelvic ultrasound 04/30/2015 FINDINGS: Gallbladder: No gallstones or wall thickening visualized. No sonographic Murphy sign noted by sonographer. Common bile duct: Diameter: Within normal limits at 2 mm Liver: No focal lesion identified. Within normal limits in parenchymal echogenicity. IVC: No abnormality visualized. Pancreas: Visualized portion unremarkable. Spleen: Size and appearance within normal limits. Right Kidney: Length: 11.9 cm. Moderate right-sided hydronephrosis. No renal mass. No definite nephrolithiasis. Left Kidney: Length: 11.3 cm. Echogenicity within normal limits. No mass or hydronephrosis visualized. Abdominal aorta: No aneurysm visualized. Other findings: Unremarkable appearance of the bladder. Ureteral jets not visualized. IMPRESSION: 1. Moderate right-sided hydronephrosis may be secondary to a distal ureteral stone, or compression related to the gravid uterus. 2. Otherwise, unremarkable abdominal  ultrasound. No evidence of cholelithiasis. Electronically Signed   By: Jacqulynn Cadet M.D.   On: 05/30/2015 09:33      Sherwood Gambler, MD 05/30/15 [phone]

## 2015-05-30 NOTE — ED Notes (Signed)
Pt sleeping at this time.

## 2015-05-30 NOTE — ED Notes (Signed)
Pt made aware that she needs an ultrasound and will be staying here until she can get the [place] around 9am.

## 2015-05-30 NOTE — ED Provider Notes (Signed)
CSN: [id number]     Arrival date & time 05/29/15  2025 History  By signing my name below, I, Irene Pap, attest that this documentation has been prepared under the direction and in the presence of Rolland Porter, MD at 2345. Electronically Signed: Irene Pap, ED Scribe. 05/30/2015. 12:59 AM.  Chief Complaint  Patient presents with  . Flank Pain   The history is provided by the patient. No language interpreter was used.  HPI Comments: Jody Hansen is a 20 y.o. G63P0Ab0 female who is currently [redacted] weeks pregnant who presents to the Emergency Department complaining of constant, stabbing, sharp right lower back pain that radiates to the lower right abdomen onset today. Pt recently had intermittent abdominal cramping 3 days ago that has since resolved. The abdominal cramping last a few seconds and happens about twice a day. Pt is seen at [facility]. She reports worsening pain with changing positions and mild relief with laying on her right side. She reports associated nausea, diarrhea x1, frequency, decreased urine output,  urinary urgency starting one day ago and vomiting x2 onset 2 hours ago while in the ED waiting to be seen. She states that "it felt different" a few days ago after she urinated, attributing this to the way she wiped with toilet paper. 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 She states that she lifted the bag by herself today. Pt also states that she ate sausage that no one else ate and no one else has not felt well. She denies hx of smoking, hx of drinking, fever, chills, vaginal discharge, or vaginal bleeding.   [facility] PCP: Sallee Lange, MD  Past Medical History  Diagnosis Date  . Scoliosis   . Pregnant 01/23/2015   Past Surgical History  Procedure Laterality Date  . No past surgeries     Family History  Problem Relation Age of Onset  . Diabetes Paternal Grandfather   . Diabetes Paternal Grandmother   . Cancer Maternal  Grandmother   . Cancer Maternal Grandfather   . Diabetes Father   . Diabetes Mother   . Diabetes Sister     borderline   Social History  Substance Use Topics  . Smoking status: Never Smoker   . Smokeless tobacco: Never Used  . Alcohol Use: No   employed  OB History    Gravida Para Term Preterm AB TAB SAB Ectopic Multiple Living   1              Review of Systems  Constitutional: Negative for fever and chills.  Gastrointestinal: Positive for nausea, vomiting and diarrhea.  Genitourinary: Positive for urgency, frequency, flank pain and decreased urine volume. Negative for vaginal bleeding and vaginal discharge.  Musculoskeletal: Positive for back pain.  All other systems reviewed and are negative.     Allergies  Depo-provera  Home Medications   Prior to Admission medications   Medication Sig Start Date End Date Taking? Authorizing Provider  Pediatric Multiple Vit-C-FA (FLINSTONES GUMMIES OMEGA-3 DHA PO) Take 2 tablets by mouth daily.    Historical Provider, MD   BP 139/90 mmHg  Pulse 81  Temp(Src) 98.4 F (36.9 C) (Oral)  Resp 16  Ht 5\' 1"  (1.549 m)  Wt 188 lb (85.276 kg)  BMI 35.54 kg/m2  SpO2 99%  LMP 12/16/2014 (Exact Date)  Vital signs normal   Physical Exam  Constitutional: She is oriented to person, place, and time. She appears well-developed and  well-nourished.  Non-toxic appearance. She does not appear ill. No distress.  HENT:  Head: Normocephalic and atraumatic.  Right Ear: External ear normal.  Left Ear: External ear normal.  Nose: Nose normal. No mucosal edema or rhinorrhea.  Mouth/Throat: Oropharynx is clear and moist and mucous membranes are normal. No dental abscesses or uvula swelling.  Eyes: Conjunctivae and EOM are normal. Pupils are equal, round, and reactive to light.  Neck: Normal range of motion and full passive range of motion without pain. Neck supple.  Cardiovascular: Normal rate, regular rhythm and normal heart sounds.  Exam reveals  no gallop and no friction rub.   No murmur heard. Pulmonary/Chest: Effort normal and breath sounds normal. No respiratory distress. She has no wheezes. She has no rhonchi. She has no rales. She exhibits no tenderness and no crepitus.  Abdominal: Soft. Normal appearance and bowel sounds are normal. She exhibits no distension. There is no tenderness. There is no rebound and no guarding.  Nausea caused with palpation of epigastrium; no pain with palpation. Uterus consistent with dates. Fetal heart rate was 168.  Genitourinary:  No CVAT bilaterally  Musculoskeletal: Normal range of motion. She exhibits no edema or tenderness.  Moves all extremities well.   Neurological: She is alert and oriented to person, place, and time. She has normal strength. No cranial nerve deficit.  Skin: Skin is warm, dry and intact. No rash noted. No erythema. No pallor.  Psychiatric: She has a normal mood and affect. Her speech is normal and behavior is normal. Her mood appears not anxious.  Nursing note and vitals reviewed.   ED Course  Procedures (including critical care time)  Medications  nitrofurantoin (macrocrystal-monohydrate) (MACROBID) capsule 100 mg (100 mg Oral Given 05/30/15 0114)  ondansetron (ZOFRAN-ODT) disintegrating tablet 8 mg (8 mg Oral Given 05/30/15 0140)  ketorolac (TORADOL) 30 MG/ML injection 30 mg (30 mg Intravenous Given 05/30/15 0412)    DIAGNOSTIC STUDIES: Oxygen Saturation is 99% on RA, normal by my interpretation.    COORDINATION OF CARE: 12:58 AM-Discussed treatment plan which includes labs and Macrobid with pt at bedside and pt agreed to plan. Patient was started on Macrobid with the abnormal urinalysis although admittedly it is contaminated.  01:30 pt c/o nausea, she was given zofran ODT.   3:45 AM patient states her pain is returning. Since patient is in her second trimester Toradol is not contraindicated. She was given Toradol IM.  Recheck at 5 AM patient states her pain is  gone now after the Toradol. We discussed staying in the ED and get an ultrasound to try to further delineate what is causing her pain whether it is a renal stone, pyelonephritis or gallstones or pregnancy related. Patient is willing to stay until ultrasound arrives later this morning.  07:30 Pt signed out to Dr Regenia Skeeter at change of shift to get [place] results.    Labs Review Results for orders placed or performed during the hospital encounter of 05/29/15  Urinalysis, Routine w reflex microscopic (not at [facility])  Result Value Ref Range   Color, Urine YELLOW YELLOW   APPearance CLEAR CLEAR   Specific Gravity, Urine 1.025 1.005 - 1.030   pH 6.5 5.0 - 8.0   Glucose, UA NEGATIVE NEGATIVE mg/dL   Hgb urine dipstick SMALL (A) NEGATIVE   Bilirubin Urine NEGATIVE NEGATIVE   Ketones, ur NEGATIVE NEGATIVE mg/dL   Protein, ur NEGATIVE NEGATIVE mg/dL   Nitrite NEGATIVE NEGATIVE   Leukocytes, UA NEGATIVE NEGATIVE  Urine microscopic-add on  Result  Value Ref Range   Squamous Epithelial / LPF 6-30 (A) NONE SEEN   WBC, UA 6-30 0 - 5 WBC/hpf   RBC / HPF 0-5 0 - 5 RBC/hpf   Bacteria, UA MANY (A) NONE SEEN  Comprehensive metabolic panel  Result Value Ref Range   Sodium 137 135 - 145 mmol/L   Potassium 4.2 3.5 - 5.1 mmol/L   Chloride 107 101 - 111 mmol/L   CO2 23 22 - 32 mmol/L   Glucose, Bld 108 (H) 65 - 99 mg/dL   BUN 11 6 - 20 mg/dL   Creatinine, Ser 0.82 0.44 - 1.00 mg/dL   Calcium 8.5 (L) 8.9 - 10.3 mg/dL   Total Protein 7.0 6.5 - 8.1 g/dL   Albumin 3.3 (L) 3.5 - 5.0 g/dL   AST 18 15 - 41 U/L   ALT 12 (L) 14 - 54 U/L   Alkaline Phosphatase 67 38 - 126 U/L   Total Bilirubin 0.4 0.3 - 1.2 mg/dL   GFR calc non Af Amer >60 >60 mL/min   GFR calc Af Amer >60 >60 mL/min   Anion gap 7 5 - 15  Lipase, blood  Result Value Ref Range   Lipase 29 11 - 51 U/L  CBC with Differential  Result Value Ref Range   WBC 16.0 (H) 4.0 - 10.5 K/uL   RBC 3.71 (L) 3.87 - 5.11 MIL/uL   Hemoglobin 12.0 12.0 - 15.0  g/dL   HCT 35.2 (L) 36.0 - 46.0 %   MCV 94.9 78.0 - 100.0 fL   MCH 32.3 26.0 - 34.0 pg   MCHC 34.1 30.0 - 36.0 g/dL   RDW 13.2 11.5 - 15.5 %   Platelets 216 150 - 400 K/uL   Neutrophils Relative % 88 %   Neutro Abs 14.0 (H) 1.7 - 7.7 K/uL   Lymphocytes Relative 7 %   Lymphs Abs 1.2 0.7 - 4.0 K/uL   Monocytes Relative 5 %   Monocytes Absolute 0.8 0.1 - 1.0 K/uL   Eosinophils Relative 0 %   Eosinophils Absolute 0.0 0.0 - 0.7 K/uL   Basophils Relative 0 %   Basophils Absolute 0.0 0.0 - 0.1 K/uL    Laboratory interpretation all normal except leukocytosis, contaminated urinalysis      Imaging Review No results found.  [place] pending  US Ob Comp + 14 Wk  04/30/2015   SECOND TRIMESTER SONOGRAM : I have reviewed the sonogram results above, combined with the patient's current clinical course, below are my impressions and any appropriate recommendations for management based on the sonographic findings. 1.  G1P0 Estimated Date of Delivery: 09/22/15 by  LMP, early ultrasound and confirmed by today's sonographic dating 2.  Normal fetal sonographic findings, specifically normal detailed anatomical evaluation,      no abnormalities noted suboptimal facial views, will repeat at 28 weeks to re evaluate 3.  Normal general sonographic findings Recommend routine prenatal care based on this sonogram or as clinically indicated EURE,LUTHER H 04/30/2015 4:06 PM      I have personally reviewed and evaluated these images and lab results as part of my medical decision-making.    MDM   Final diagnoses:  Pregnancy  Acute right flank pain  UTI (lower urinary tract infection)   Disposition pending  Rolland Porter, MD, FACEP  I personally performed the services described in this documentation, which was scribed in my presence. The recorded information has been reviewed and considered.  Rolland Porter, MD, FACEP    Daleen Bo  Tomi Bamberger, MD 05/30/15 [phone]

## 2015-05-30 NOTE — ED Notes (Signed)
Pt states her pain is back in her right flank area and is nauseated.

## 2015-06-02 ENCOUNTER — Encounter: Payer: Self-pay | Admitting: Obstetrics & Gynecology

## 2015-06-02 ENCOUNTER — Ambulatory Visit (INDEPENDENT_AMBULATORY_CARE_PROVIDER_SITE_OTHER): Payer: Medicaid Other | Admitting: Obstetrics & Gynecology

## 2015-06-02 VITALS — BP 100/80 | HR 80 | Wt 191.0 lb

## 2015-06-02 DIAGNOSIS — Z1389 Encounter for screening for other disorder: Secondary | ICD-10-CM

## 2015-06-02 DIAGNOSIS — Z331 Pregnant state, incidental: Secondary | ICD-10-CM

## 2015-06-02 DIAGNOSIS — N200 Calculus of kidney: Secondary | ICD-10-CM

## 2015-06-02 LAB — POCT URINALYSIS DIPSTICK
Glucose, UA: NEGATIVE
Ketones, UA: NEGATIVE
Leukocytes, UA: NEGATIVE
Nitrite, UA: NEGATIVE
PROTEIN UA: NEGATIVE

## 2015-06-02 MED ORDER — PROMETHAZINE HCL 25 MG PO TABS: 25.0000 mg | ORAL_TABLET | Freq: Four times a day (QID) | ORAL | Status: DC | PRN

## 2015-06-02 MED ORDER — HYDROCODONE-ACETAMINOPHEN 5-325 MG PO TABS: 1.0000 | ORAL_TABLET | Freq: Four times a day (QID) | ORAL | Status: DC | PRN

## 2015-06-02 NOTE — Progress Notes (Signed)
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  Meds ordered this encounter  Medications  . HYDROcodone-acetaminophen (NORCO/VICODIN) 5-325 MG tablet    Sig: Take 1 tablet by mouth every 6 (six) hours as needed.    Dispense:  30 tablet    Refill:  0  . promethazine (PHENERGAN) 25 MG tablet    Sig: Take 1 tablet (25 mg total) by mouth every 6 (six) hours as needed for nausea or vomiting.    Dispense:  30 tablet    Refill:  1

## 2015-06-04 LAB — URINE CULTURE

## 2015-06-05 ENCOUNTER — Telehealth: Payer: Self-pay | Admitting: *Deleted

## 2015-06-05 NOTE — ED Notes (Signed)
Post ED Visit - Positive Culture Follow-up  Culture report reviewed by antimicrobial stewardship pharmacist:  []  Elenor Quinones, Pharm.D. []  Heide Guile, Pharm.D., BCPS []  Parks Neptune, Pharm.D. []  Alycia Rossetti, Pharm.D., BCPS []  [place], Pharm.D., BCPS, AAHIVP []  Legrand Como, Pharm.D., BCPS, AAHIVP []  Milus Glazier, Pharm.D. []  Stephens November, Pharm.DGovernor Specking, Pharm D  Positive urine culture Treated with Nirtofurantoin Monohyd Macro, organism sensitive to the same and no further patient follow-up is required at this time.  Harlon Flor [facility] 06/05/2015, 1:51 PM

## 2015-06-16 ENCOUNTER — Ambulatory Visit (INDEPENDENT_AMBULATORY_CARE_PROVIDER_SITE_OTHER): Payer: Medicaid Other | Admitting: Obstetrics & Gynecology

## 2015-06-16 ENCOUNTER — Encounter: Payer: Self-pay | Admitting: Obstetrics & Gynecology

## 2015-06-16 VITALS — BP 120/80 | HR 72 | Wt 190.3 lb

## 2015-06-16 DIAGNOSIS — Z3A26 26 weeks gestation of pregnancy: Secondary | ICD-10-CM

## 2015-06-16 DIAGNOSIS — N200 Calculus of kidney: Secondary | ICD-10-CM

## 2015-06-16 DIAGNOSIS — Z331 Pregnant state, incidental: Secondary | ICD-10-CM

## 2015-06-16 DIAGNOSIS — Z3402 Encounter for supervision of normal first pregnancy, second trimester: Secondary | ICD-10-CM

## 2015-06-16 DIAGNOSIS — Z1389 Encounter for screening for other disorder: Secondary | ICD-10-CM

## 2015-06-16 LAB — POCT URINALYSIS DIPSTICK
Blood, UA: NEGATIVE
Glucose, UA: NEGATIVE
KETONES UA: NEGATIVE
LEUKOCYTES UA: NEGATIVE
NITRITE UA: NEGATIVE
PROTEIN UA: NEGATIVE

## 2015-06-16 NOTE — Progress Notes (Signed)
G1P0 [redacted]w[redacted]d Estimated Date of Delivery: 09/22/15  Blood pressure 120/80, pulse 72, weight 190 lb 4.8 oz (86.32 kg), last menstrual period 12/16/2014.   BP weight and urine results all reviewed and noted.  Please refer to the obstetrical flow sheet for the fundal height and fetal heart rate documentation:  Patient reports good fetal movement, denies any bleeding and no rupture of membranes symptoms or regular contractions. Patient is without complaints. All questions were answered.  Orders Placed This Encounter  Procedures  . POCT urinalysis dipstick    Plan:  Continued routine obstetrical care,   Pt states her pain resolved about 5 days or so after her last visit, no pain since, no blood in urine, did not find anything in the strainer Also has developed a right ganglion cyst of the right wrist, will be addressed after delivery  No Follow-up on file.

## 2015-06-29 ENCOUNTER — Ambulatory Visit (INDEPENDENT_AMBULATORY_CARE_PROVIDER_SITE_OTHER): Payer: Medicaid Other

## 2015-06-29 ENCOUNTER — Other Ambulatory Visit: Payer: Self-pay | Admitting: Advanced Practice Midwife

## 2015-06-29 ENCOUNTER — Other Ambulatory Visit: Payer: Medicaid Other

## 2015-06-29 ENCOUNTER — Ambulatory Visit (INDEPENDENT_AMBULATORY_CARE_PROVIDER_SITE_OTHER): Payer: Medicaid Other | Admitting: Women's Health

## 2015-06-29 ENCOUNTER — Encounter: Payer: Self-pay | Admitting: Women's Health

## 2015-06-29 VITALS — BP 120/64 | HR 88 | Wt 196.0 lb

## 2015-06-29 DIAGNOSIS — Z36 Encounter for antenatal screening of mother: Secondary | ICD-10-CM

## 2015-06-29 DIAGNOSIS — Z1389 Encounter for screening for other disorder: Secondary | ICD-10-CM

## 2015-06-29 DIAGNOSIS — Z0489 Encounter for examination and observation for other specified reasons: Secondary | ICD-10-CM

## 2015-06-29 DIAGNOSIS — Z131 Encounter for screening for diabetes mellitus: Secondary | ICD-10-CM

## 2015-06-29 DIAGNOSIS — Z369 Encounter for antenatal screening, unspecified: Secondary | ICD-10-CM

## 2015-06-29 DIAGNOSIS — IMO0002 Reserved for concepts with insufficient information to code with codable children: Secondary | ICD-10-CM

## 2015-06-29 DIAGNOSIS — Z331 Pregnant state, incidental: Secondary | ICD-10-CM

## 2015-06-29 DIAGNOSIS — Z3402 Encounter for supervision of normal first pregnancy, second trimester: Secondary | ICD-10-CM

## 2015-06-29 LAB — POCT URINALYSIS DIPSTICK
Blood, UA: NEGATIVE
Glucose, UA: NEGATIVE
KETONES UA: NEGATIVE
LEUKOCYTES UA: NEGATIVE
NITRITE UA: NEGATIVE

## 2015-06-29 NOTE — Progress Notes (Addendum)
[place] 27+6 wks,measurements c/w dates, cephalic,cx 3.3 cm,post pl gr 0,afi 15.1 cm,fhr 151 bpm,normal ov's bilat,efw 1185 g 56%,facial anatomy complete,no obvious abnormalities seen

## 2015-06-29 NOTE — Progress Notes (Signed)
Low-risk OB appointment G1P0 [redacted]w[redacted]d Estimated Date of Delivery: 09/22/15 BP 120/64 mmHg  Pulse 88  Wt 196 lb (88.905 kg)  LMP 12/16/2014 (Exact Date)  BP, weight, and urine reviewed.  Refer to obstetrical flow sheet for FH & FHR.  Reports good fm.  Denies regular uc's, lof, vb, or uti s/s. Had blood in urine on Sat and Lt flank pain, has h/o kidney stones, no blood/pain since. No blood in urine today.  Reviewed ptl s/s, fkc. Recommended Tdap at HD/PCP per CDC guidelines.  Plan:  Continue routine obstetrical care  F/U in 4wks for OB appointment  PN2 today

## 2015-06-29 NOTE — Patient Instructions (Signed)

## 2015-07-01 LAB — HIV ANTIBODY (ROUTINE TESTING W REFLEX): HIV SCREEN 4TH GENERATION: NONREACTIVE

## 2015-07-01 LAB — CBC
HEMOGLOBIN: 11.5 g/dL (ref 11.1–15.9)
Hematocrit: 33.4 % — ABNORMAL LOW (ref 34.0–46.6)
MCH: 31.6 pg (ref 26.6–33.0)
MCHC: 34.4 g/dL (ref 31.5–35.7)
MCV: 92 fL (ref 79–97)
PLATELETS: 207 10*3/uL (ref 150–379)
RBC: 3.64 x10E6/uL — ABNORMAL LOW (ref 3.77–5.28)
RDW: 13.3 % (ref 12.3–15.4)
WBC: 9.1 10*3/uL (ref 3.4–10.8)

## 2015-07-01 LAB — RPR: RPR: NONREACTIVE

## 2015-07-01 LAB — GLUCOSE TOLERANCE, 2 HOURS W/ 1HR
GLUCOSE, FASTING: 90 mg/dL (ref 65–91)
Glucose, 1 hour: 129 mg/dL (ref 65–179)
Glucose, 2 hour: 94 mg/dL (ref 65–152)

## 2015-07-01 LAB — ANTIBODY SCREEN: ANTIBODY SCREEN: NEGATIVE

## 2015-07-27 ENCOUNTER — Encounter: Payer: Self-pay | Admitting: Women's Health

## 2015-07-27 ENCOUNTER — Ambulatory Visit (INDEPENDENT_AMBULATORY_CARE_PROVIDER_SITE_OTHER): Payer: Medicaid Other | Admitting: Women's Health

## 2015-07-27 VITALS — BP 120/70 | HR 87 | Wt 201.0 lb

## 2015-07-27 DIAGNOSIS — Z3A32 32 weeks gestation of pregnancy: Secondary | ICD-10-CM

## 2015-07-27 DIAGNOSIS — Z331 Pregnant state, incidental: Secondary | ICD-10-CM

## 2015-07-27 DIAGNOSIS — Z3403 Encounter for supervision of normal first pregnancy, third trimester: Secondary | ICD-10-CM

## 2015-07-27 DIAGNOSIS — Z1389 Encounter for screening for other disorder: Secondary | ICD-10-CM

## 2015-07-27 LAB — POCT URINALYSIS DIPSTICK
GLUCOSE UA: NEGATIVE
Ketones, UA: NEGATIVE
LEUKOCYTES UA: NEGATIVE
NITRITE UA: NEGATIVE
RBC UA: NEGATIVE

## 2015-07-27 NOTE — Progress Notes (Signed)
Pt denies any problems or concerns at this time.  

## 2015-07-27 NOTE — Patient Instructions (Signed)
Call the office ([phone]) or go to [facility] if:  You begin to have strong, frequent contractions  Your water breaks.  Sometimes it is a big gush of fluid, sometimes it is just a trickle that keeps getting your panties wet or running down your legs  You have vaginal bleeding.  It is normal to have a small amount of spotting if your cervix was checked.   You don't feel your baby moving like normal.  If you don't, get you something to eat and drink and lay down and focus on feeling your baby move.  You should feel at least 10 movements in 2 hours.  If you don't, you should call the office or go to [facility].    Preterm Labor Information Preterm labor is when labor starts at less than 37 weeks of pregnancy. The normal length of a pregnancy is 39 to 41 weeks. CAUSES Often, there is no identifiable underlying cause as to why a woman goes into preterm labor. One of the most common known causes of preterm labor is infection. Infections of the uterus, cervix, vagina, amniotic sac, bladder, kidney, or even the lungs (pneumonia) can cause labor to start. Other suspected causes of preterm labor include:   Urogenital infections, such as yeast infections and bacterial vaginosis.   Uterine abnormalities (uterine shape, uterine septum, fibroids, or bleeding from the placenta).   A cervix that has been operated on (it may fail to stay closed).   Malformations in the fetus.   Multiple gestations (twins, triplets, and so on).   Breakage of the amniotic sac.  RISK FACTORS  Having a previous history of preterm labor.   Having premature rupture of membranes (PROM).   Having a placenta that covers the opening of the cervix (placenta previa).   Having a placenta that separates from the uterus (placental abruption).   Having a cervix that is too weak to hold the fetus in the uterus (incompetent cervix).   Having too much fluid in the amniotic sac (polyhydramnios).   Taking  illegal drugs or smoking while pregnant.   Not gaining enough weight while pregnant.   Being younger than 18 and older than 20 years old.   Having a low socioeconomic status.   Being African American. SYMPTOMS Signs and symptoms of preterm labor include:   Menstrual-like cramps, abdominal pain, or back pain.  Uterine contractions that are regular, as frequent as six in an hour, regardless of their intensity (may be mild or painful).  Contractions that start on the top of the uterus and spread down to the lower abdomen and back.   A sense of increased pelvic pressure.   A watery or bloody mucus discharge that comes from the vagina.  TREATMENT Depending on the length of the pregnancy and other circumstances, your health care provider may suggest bed rest. If necessary, there are medicines that can be given to stop contractions and to mature the fetal lungs. If labor happens before 34 weeks of pregnancy, a prolonged hospital stay may be recommended. Treatment depends on the condition of both you and the fetus.  WHAT SHOULD YOU DO IF YOU THINK YOU ARE IN PRETERM LABOR? Call your health care provider right away. You will need to go to the hospital to get checked immediately. HOW CAN YOU PREVENT PRETERM LABOR IN FUTURE PREGNANCIES? You should:   Stop smoking if you smoke.  Maintain healthy weight gain and avoid chemicals and drugs that are not necessary.  Be watchful for   any type of infection.  Inform your health care provider if you have a known history of preterm labor.   This information is not intended to replace advice given to you by your health care provider. Make sure you discuss any questions you have with your health care provider.   Document Released: 05/14/2003 Document Revised: 10/24/2012 Document Reviewed: 03/26/2012 Elsevier Interactive Patient Education 2016 Elsevier Inc.  

## 2015-07-27 NOTE — Progress Notes (Signed)
Low-risk OB appointment G1P0 [redacted]w[redacted]d Estimated Date of Delivery: 09/22/15 BP 120/70 mmHg  Pulse 87  Wt 201 lb (91.173 kg)  LMP 12/16/2014 (Exact Date)  BP, weight, and urine reviewed.  Refer to obstetrical flow sheet for FH & FHR.  Reports good fm.  Denies regular uc's, lof, vb, or uti s/s. Clear mucousy d/c the other day, concerned it may be mucous plug-had had sex prior. No odor/itching/irritation. No ptl s/s. Grandma told her to ask about swelling in legs. BLE trace edema, bp normal, tr proteinuria, denies s/s pre-e- discussed normal dependent edema during pregnancy- to elevate Didn't make it to cb class, 'too far'- recommended contacting [place] for class and [facility] for tour Reviewed ptl s/s, fkc, normal pn2 results Plan:  Continue routine obstetrical care  F/U in 2wks for OB appointment

## 2015-08-10 ENCOUNTER — Ambulatory Visit (INDEPENDENT_AMBULATORY_CARE_PROVIDER_SITE_OTHER): Payer: Medicaid Other | Admitting: Women's Health

## 2015-08-10 ENCOUNTER — Encounter: Payer: Self-pay | Admitting: Women's Health

## 2015-08-10 VITALS — BP 106/60 | HR 80 | Wt 204.0 lb

## 2015-08-10 DIAGNOSIS — Z3403 Encounter for supervision of normal first pregnancy, third trimester: Secondary | ICD-10-CM

## 2015-08-10 DIAGNOSIS — Z1389 Encounter for screening for other disorder: Secondary | ICD-10-CM

## 2015-08-10 DIAGNOSIS — M6740 Ganglion, unspecified site: Secondary | ICD-10-CM

## 2015-08-10 DIAGNOSIS — Z331 Pregnant state, incidental: Secondary | ICD-10-CM

## 2015-08-10 LAB — POCT URINALYSIS DIPSTICK
GLUCOSE UA: NEGATIVE
Ketones, UA: NEGATIVE
Leukocytes, UA: NEGATIVE
NITRITE UA: NEGATIVE
PROTEIN UA: NEGATIVE
RBC UA: NEGATIVE

## 2015-08-10 NOTE — Patient Instructions (Signed)
Call the office ([phone]) or go to [facility] if:  You begin to have strong, frequent contractions  Your water breaks.  Sometimes it is a big gush of fluid, sometimes it is just a trickle that keeps getting your panties wet or running down your legs  You have vaginal bleeding.  It is normal to have a small amount of spotting if your cervix was checked.   You don't feel your baby moving like normal.  If you don't, get you something to eat and drink and lay down and focus on feeling your baby move.  You should feel at least 10 movements in 2 hours.  If you don't, you should call the office or go to [facility].    Preterm Labor Information Preterm labor is when labor starts at less than 37 weeks of pregnancy. The normal length of a pregnancy is 39 to 41 weeks. CAUSES Often, there is no identifiable underlying cause as to why a woman goes into preterm labor. One of the most common known causes of preterm labor is infection. Infections of the uterus, cervix, vagina, amniotic sac, bladder, kidney, or even the lungs (pneumonia) can cause labor to start. Other suspected causes of preterm labor include:   Urogenital infections, such as yeast infections and bacterial vaginosis.   Uterine abnormalities (uterine shape, uterine septum, fibroids, or bleeding from the placenta).   A cervix that has been operated on (it may fail to stay closed).   Malformations in the fetus.   Multiple gestations (twins, triplets, and so on).   Breakage of the amniotic sac.  RISK FACTORS  Having a previous history of preterm labor.   Having premature rupture of membranes (PROM).   Having a placenta that covers the opening of the cervix (placenta previa).   Having a placenta that separates from the uterus (placental abruption).   Having a cervix that is too weak to hold the fetus in the uterus (incompetent cervix).   Having too much fluid in the amniotic sac (polyhydramnios).   Taking  illegal drugs or smoking while pregnant.   Not gaining enough weight while pregnant.   Being younger than 18 and older than 20 years old.   Having a low socioeconomic status.   Being African American. SYMPTOMS Signs and symptoms of preterm labor include:   Menstrual-like cramps, abdominal pain, or back pain.  Uterine contractions that are regular, as frequent as six in an hour, regardless of their intensity (may be mild or painful).  Contractions that start on the top of the uterus and spread down to the lower abdomen and back.   A sense of increased pelvic pressure.   A watery or bloody mucus discharge that comes from the vagina.  TREATMENT Depending on the length of the pregnancy and other circumstances, your health care provider may suggest bed rest. If necessary, there are medicines that can be given to stop contractions and to mature the fetal lungs. If labor happens before 34 weeks of pregnancy, a prolonged hospital stay may be recommended. Treatment depends on the condition of both you and the fetus.  WHAT SHOULD YOU DO IF YOU THINK YOU ARE IN PRETERM LABOR? Call your health care provider right away. You will need to go to the hospital to get checked immediately. HOW CAN YOU PREVENT PRETERM LABOR IN FUTURE PREGNANCIES? You should:   Stop smoking if you smoke.  Maintain healthy weight gain and avoid chemicals and drugs that are not necessary.  Be watchful for   any type of infection.  Inform your health care provider if you have a known history of preterm labor.   This information is not intended to replace advice given to you by your health care provider. Make sure you discuss any questions you have with your health care provider.   Document Released: 05/14/2003 Document Revised: 10/24/2012 Document Reviewed: 03/26/2012 Elsevier Interactive Patient Education 2016 Elsevier Inc.  

## 2015-08-10 NOTE — Progress Notes (Signed)
Low-risk OB appointment G1P0 [redacted]w[redacted]d Estimated Date of Delivery: 09/22/15 BP 106/60 mmHg  Pulse 80  Wt 204 lb (92.534 kg)  LMP 12/16/2014 (Exact Date)  BP, weight, and urine reviewed.  Refer to obstetrical flow sheet for FH & FHR.  Reports good fm.  Denies regular uc's, lof, vb, or uti s/s. Some cramping, occ wakes her up at night and she has to void. Increase po intake, sleep on sides. Ganglion cyst Rt posterior wrist- small, mobile, nontender- states doesn't really bother her- if grows large/bothers her will refer to ortho. Didn't go to cb classes, hasn't been to [facility]- recommended tour.  Reviewed ptl s/s, fkc. Plan:  Continue routine obstetrical care  F/U in 2wks for OB appointment

## 2015-08-25 ENCOUNTER — Encounter: Payer: Self-pay | Admitting: Women's Health

## 2015-08-25 ENCOUNTER — Ambulatory Visit (INDEPENDENT_AMBULATORY_CARE_PROVIDER_SITE_OTHER): Payer: Medicaid Other | Admitting: Women's Health

## 2015-08-25 VITALS — BP 104/58 | HR 80 | Wt 207.0 lb

## 2015-08-25 DIAGNOSIS — Z331 Pregnant state, incidental: Secondary | ICD-10-CM

## 2015-08-25 DIAGNOSIS — Z1389 Encounter for screening for other disorder: Secondary | ICD-10-CM

## 2015-08-25 DIAGNOSIS — Z3403 Encounter for supervision of normal first pregnancy, third trimester: Secondary | ICD-10-CM

## 2015-08-25 LAB — POCT URINALYSIS DIPSTICK
Glucose, UA: NEGATIVE
KETONES UA: NEGATIVE
LEUKOCYTES UA: NEGATIVE
Nitrite, UA: NEGATIVE
Protein, UA: NEGATIVE

## 2015-08-25 NOTE — Progress Notes (Signed)
Low-risk OB appointment G1P0 [redacted]w[redacted]d Estimated Date of Delivery: 09/22/15 BP 104/58 mmHg  Pulse 80  Wt 207 lb (93.895 kg)  LMP 12/16/2014 (Exact Date)  BP, weight, and urine reviewed.  Refer to obstetrical flow sheet for FH & FHR.  Reports good fm.  Denies regular uc's, lof, vb, or uti s/s. No complaints. Reviewed ptl s/s, fkc. Plan:  Continue routine obstetrical care  F/U in 1wk for OB appointment and gbs

## 2015-08-25 NOTE — Patient Instructions (Signed)
Call the office ([phone]) or go to [facility] if:  You begin to have strong, frequent contractions  Your water breaks.  Sometimes it is a big gush of fluid, sometimes it is just a trickle that keeps getting your panties wet or running down your legs  You have vaginal bleeding.  It is normal to have a small amount of spotting if your cervix was checked.   You don't feel your baby moving like normal.  If you don't, get you something to eat and drink and lay down and focus on feeling your baby move.  You should feel at least 10 movements in 2 hours.  If you don't, you should call the office or go to [facility].    Preterm Labor Information Preterm labor is when labor starts at less than 37 weeks of pregnancy. The normal length of a pregnancy is 39 to 41 weeks. CAUSES Often, there is no identifiable underlying cause as to why a woman goes into preterm labor. One of the most common known causes of preterm labor is infection. Infections of the uterus, cervix, vagina, amniotic sac, bladder, kidney, or even the lungs (pneumonia) can cause labor to start. Other suspected causes of preterm labor include:   Urogenital infections, such as yeast infections and bacterial vaginosis.   Uterine abnormalities (uterine shape, uterine septum, fibroids, or bleeding from the placenta).   A cervix that has been operated on (it may fail to stay closed).   Malformations in the fetus.   Multiple gestations (twins, triplets, and so on).   Breakage of the amniotic sac.  RISK FACTORS  Having a previous history of preterm labor.   Having premature rupture of membranes (PROM).   Having a placenta that covers the opening of the cervix (placenta previa).   Having a placenta that separates from the uterus (placental abruption).   Having a cervix that is too weak to hold the fetus in the uterus (incompetent cervix).   Having too much fluid in the amniotic sac (polyhydramnios).   Taking  illegal drugs or smoking while pregnant.   Not gaining enough weight while pregnant.   Being younger than 18 and older than 20 years old.   Having a low socioeconomic status.   Being African American. SYMPTOMS Signs and symptoms of preterm labor include:   Menstrual-like cramps, abdominal pain, or back pain.  Uterine contractions that are regular, as frequent as six in an hour, regardless of their intensity (may be mild or painful).  Contractions that start on the top of the uterus and spread down to the lower abdomen and back.   A sense of increased pelvic pressure.   A watery or bloody mucus discharge that comes from the vagina.  TREATMENT Depending on the length of the pregnancy and other circumstances, your health care provider may suggest bed rest. If necessary, there are medicines that can be given to stop contractions and to mature the fetal lungs. If labor happens before 34 weeks of pregnancy, a prolonged hospital stay may be recommended. Treatment depends on the condition of both you and the fetus.  WHAT SHOULD YOU DO IF YOU THINK YOU ARE IN PRETERM LABOR? Call your health care provider right away. You will need to go to the hospital to get checked immediately. HOW CAN YOU PREVENT PRETERM LABOR IN FUTURE PREGNANCIES? You should:   Stop smoking if you smoke.  Maintain healthy weight gain and avoid chemicals and drugs that are not necessary.  Be watchful for   any type of infection.  Inform your health care provider if you have a known history of preterm labor.   This information is not intended to replace advice given to you by your health care provider. Make sure you discuss any questions you have with your health care provider.   Document Released: 05/14/2003 Document Revised: 10/24/2012 Document Reviewed: 03/26/2012 Elsevier Interactive Patient Education 2016 Elsevier Inc.  

## 2015-09-01 ENCOUNTER — Encounter: Payer: Self-pay | Admitting: Women's Health

## 2015-09-01 ENCOUNTER — Ambulatory Visit (INDEPENDENT_AMBULATORY_CARE_PROVIDER_SITE_OTHER): Payer: Medicaid Other | Admitting: Women's Health

## 2015-09-01 VITALS — BP 102/72 | HR 92 | Wt 209.0 lb

## 2015-09-01 DIAGNOSIS — Z3403 Encounter for supervision of normal first pregnancy, third trimester: Secondary | ICD-10-CM

## 2015-09-01 DIAGNOSIS — Z331 Pregnant state, incidental: Secondary | ICD-10-CM

## 2015-09-01 DIAGNOSIS — Z118 Encounter for screening for other infectious and parasitic diseases: Secondary | ICD-10-CM

## 2015-09-01 DIAGNOSIS — Z1159 Encounter for screening for other viral diseases: Secondary | ICD-10-CM

## 2015-09-01 DIAGNOSIS — Z1389 Encounter for screening for other disorder: Secondary | ICD-10-CM

## 2015-09-01 DIAGNOSIS — Z3685 Encounter for antenatal screening for Streptococcus B: Secondary | ICD-10-CM

## 2015-09-01 LAB — POCT URINALYSIS DIPSTICK
GLUCOSE UA: NEGATIVE
Ketones, UA: NEGATIVE
NITRITE UA: NEGATIVE
Protein, UA: NEGATIVE

## 2015-09-01 NOTE — Progress Notes (Signed)
Low-risk OB appointment G1P0 [redacted]w[redacted]d Estimated Date of Delivery: 09/22/15 BP 102/72 mmHg  Pulse 92  Wt 209 lb (94.802 kg)  LMP 12/16/2014 (Exact Date)  BP, weight, and urine reviewed.  Refer to obstetrical flow sheet for FH & FHR.  Reports good fm.  Denies regular uc's, lof, vb, or uti s/s. No complaints. GBS, gc/ct collected SVE per request: 3.5/50/-2, vtx Reviewed labor s/s, fkc. Plan:  Continue routine obstetrical care  F/U in 1wk for OB appointment

## 2015-09-01 NOTE — Patient Instructions (Signed)
Call the office ([phone]) or go to [facility] if:  You begin to have strong, frequent contractions  Your water breaks.  Sometimes it is a big gush of fluid, sometimes it is just a trickle that keeps getting your panties wet or running down your legs  You have vaginal bleeding.  It is normal to have a small amount of spotting if your cervix was checked.   You don't feel your baby moving like normal.  If you don't, get you something to eat and drink and lay down and focus on feeling your baby move.  You should feel at least 10 movements in 2 hours.  If you don't, you should call the office or go to [facility].    Braxton Hicks Contractions Contractions of the uterus can occur throughout pregnancy. Contractions are not always a sign that you are in labor.  WHAT ARE BRAXTON HICKS CONTRACTIONS?  Contractions that occur before labor are called Braxton Hicks contractions, or false labor. Toward the end of pregnancy (32-34 weeks), these contractions can develop more often and may become more forceful. This is not true labor because these contractions do not result in opening (dilatation) and thinning of the cervix. They are sometimes difficult to tell apart from true labor because these contractions can be forceful and people have different pain tolerances. You should not feel embarrassed if you go to the hospital with false labor. Sometimes, the only way to tell if you are in true labor is for your health care provider to look for changes in the cervix. If there are no prenatal problems or other health problems associated with the pregnancy, it is completely safe to be sent home with false labor and await the onset of true labor. HOW CAN YOU TELL THE DIFFERENCE BETWEEN TRUE AND FALSE LABOR? False Labor  The contractions of false labor are usually shorter and not as hard as those of true labor.   The contractions are usually irregular.   The contractions are often felt in the front of  the lower abdomen and in the groin.   The contractions may go away when you walk around or change positions while lying down.   The contractions get weaker and are shorter lasting as time goes on.   The contractions do not usually become progressively stronger, regular, and closer together as with true labor.  True Labor  Contractions in true labor last 30-70 seconds, become very regular, usually become more intense, and increase in frequency.   The contractions do not go away with walking.   The discomfort is usually felt in the top of the uterus and spreads to the lower abdomen and low back.   True labor can be determined by your health care provider with an exam. This will show that the cervix is dilating and getting thinner.  WHAT TO REMEMBER  Keep up with your usual exercises and follow other instructions given by your health care provider.   Take medicines as directed by your health care provider.   Keep your regular prenatal appointments.   Eat and drink lightly if you think you are going into labor.   If Braxton Hicks contractions are making you uncomfortable:   Change your position from lying down or resting to walking, or from walking to resting.   Sit and rest in a tub of warm water.   Drink 2-3 glasses of water. Dehydration may cause these contractions.   Do slow and deep breathing several times an hour.    WHEN SHOULD I SEEK IMMEDIATE MEDICAL CARE? Seek immediate medical care if:  Your contractions become stronger, more regular, and closer together.   You have fluid leaking or gushing from your vagina.   You have a fever.   You pass blood-tinged mucus.   You have vaginal bleeding.   You have continuous abdominal pain.   You have low back pain that you never had before.   You feel your baby's head pushing down and causing pelvic pressure.   Your baby is not moving as much as it used to.    This information is not intended to  replace advice given to you by your health care provider. Make sure you discuss any questions you have with your health care provider.   Document Released: 02/21/2005 Document Revised: 02/26/2013 Document Reviewed: 12/03/2012 Elsevier Interactive Patient Education [employer].

## 2015-09-02 ENCOUNTER — Inpatient Hospital Stay (HOSPITAL_COMMUNITY)
Admission: AD | Admit: 2015-09-02 | Discharge: 2015-09-03 | Disposition: A | Discharge status: Home / Self Care | Payer: Medicaid Other | Source: Ambulatory Visit | Attending: Obstetrics and Gynecology | Admitting: Obstetrics and Gynecology

## 2015-09-02 ENCOUNTER — Encounter (HOSPITAL_COMMUNITY): Payer: Self-pay

## 2015-09-02 DIAGNOSIS — Z3493 Encounter for supervision of normal pregnancy, unspecified, third trimester: Secondary | ICD-10-CM | POA: Insufficient documentation

## 2015-09-02 HISTORY — DX: Calculus of kidney: N20.0

## 2015-09-02 LAB — GC/CHLAMYDIA PROBE AMP
CHLAMYDIA, DNA PROBE: NEGATIVE
NEISSERIA GONORRHOEAE BY PCR: NEGATIVE

## 2015-09-02 NOTE — MAU Note (Signed)
Pt reports contractions q 5-7 minutes. Was 4 cm yesterday.

## 2015-09-03 ENCOUNTER — Encounter: Payer: Self-pay | Admitting: Women's Health

## 2015-09-03 DIAGNOSIS — Z3493 Encounter for supervision of normal pregnancy, unspecified, third trimester: Secondary | ICD-10-CM | POA: Diagnosis present

## 2015-09-03 LAB — STREP GP B NAA: Strep Gp B NAA: NEGATIVE

## 2015-09-03 LAB — OB RESULTS CONSOLE GBS: GBS: NEGATIVE

## 2015-09-03 NOTE — Progress Notes (Signed)
Pt may walk and recheck in an hour

## 2015-09-03 NOTE — Progress Notes (Signed)
Recheck in an hour

## 2015-09-09 ENCOUNTER — Ambulatory Visit (INDEPENDENT_AMBULATORY_CARE_PROVIDER_SITE_OTHER): Payer: Medicaid Other | Admitting: Women's Health

## 2015-09-09 VITALS — BP 138/76 | HR 86 | Wt 209.0 lb

## 2015-09-09 DIAGNOSIS — O368130 Decreased fetal movements, third trimester, not applicable or unspecified: Secondary | ICD-10-CM

## 2015-09-09 DIAGNOSIS — Z3A39 39 weeks gestation of pregnancy: Secondary | ICD-10-CM

## 2015-09-09 DIAGNOSIS — Z1389 Encounter for screening for other disorder: Secondary | ICD-10-CM

## 2015-09-09 DIAGNOSIS — Z3403 Encounter for supervision of normal first pregnancy, third trimester: Secondary | ICD-10-CM

## 2015-09-09 DIAGNOSIS — O368131 Decreased fetal movements, third trimester, fetus 1: Secondary | ICD-10-CM

## 2015-09-09 DIAGNOSIS — Z331 Pregnant state, incidental: Secondary | ICD-10-CM

## 2015-09-09 LAB — POCT URINALYSIS DIPSTICK
GLUCOSE UA: NEGATIVE
Ketones, UA: NEGATIVE
LEUKOCYTES UA: NEGATIVE
NITRITE UA: NEGATIVE
Protein, UA: NEGATIVE
RBC UA: NEGATIVE

## 2015-09-09 NOTE — Progress Notes (Signed)
Low-risk OB appointment G1P0 [redacted]w[redacted]d Estimated Date of Delivery: 09/22/15 BP 138/76 mmHg  Pulse 86  Wt 209 lb (94.802 kg)  LMP 12/16/2014 (Exact Date)  BP, weight, and urine reviewed.  Refer to obstetrical flow sheet for FH & FHR.  Reports decreased fm x 2d, although said normal when originally asked.  Denies regular uc's, lof, vb, or uti s/s. Multiple complaints, all written down on sheet of paper: cramps, not sleeping well b/c of cramps, abd sore, headaches- not bad enough to take apap, loose stools 2-3x/day- doesn't feel she needs meds, some nausea- no vomiting, pelvic pressure, mucousy d/c, sometimes watery- no odor/itching/irritation. Denies vision changes/ruq pain.  NST reactive/Cat 1 w/ irregular uc's SSE: cx visually 2cm, no pooling, no change w/ valsalva, fern and nitrazine neg, wet prep neg SVE per request: 4/80/-2, vtx DTRs 2+, no clonus, no edema Reviewed labor s/s, fkc, answered all questions. Plan:  Continue routine obstetrical care  F/U in 1wk for OB appointment

## 2015-09-14 ENCOUNTER — Encounter: Payer: Self-pay | Admitting: Women's Health

## 2015-09-15 ENCOUNTER — Telehealth: Payer: Self-pay | Admitting: Women's Health

## 2015-09-15 ENCOUNTER — Inpatient Hospital Stay (HOSPITAL_COMMUNITY)
Admission: AD | Admit: 2015-09-15 | Discharge: 2015-09-15 | Disposition: A | Discharge status: Home / Self Care | Payer: Medicaid Other | Source: Ambulatory Visit | Attending: Obstetrics & Gynecology | Admitting: Obstetrics & Gynecology

## 2015-09-15 ENCOUNTER — Encounter (HOSPITAL_COMMUNITY): Payer: Self-pay

## 2015-09-15 DIAGNOSIS — Z029 Encounter for administrative examinations, unspecified: Secondary | ICD-10-CM | POA: Diagnosis not present

## 2015-09-15 DIAGNOSIS — Z3403 Encounter for supervision of normal first pregnancy, third trimester: Secondary | ICD-10-CM

## 2015-09-15 LAB — URINE MICROSCOPIC-ADD ON

## 2015-09-15 LAB — URINALYSIS, ROUTINE W REFLEX MICROSCOPIC
BILIRUBIN URINE: NEGATIVE
Glucose, UA: NEGATIVE mg/dL
Ketones, ur: NEGATIVE mg/dL
Nitrite: NEGATIVE
PROTEIN: 30 mg/dL — AB
Specific Gravity, Urine: 1.025 (ref 1.005–1.030)
pH: 6 (ref 5.0–8.0)

## 2015-09-15 NOTE — MAU Note (Addendum)
Has a light bleeding, started around 2- mainly when she wipes.  Has been cramping a whole lot. Having low back pains and feeling. Sick.

## 2015-09-15 NOTE — Progress Notes (Signed)
May D/C home 

## 2015-09-15 NOTE — Telephone Encounter (Signed)
Pt states she is 39 wks and is leaking fluid and when wipes she is seeing blood.  Pt denies contractions but states her back is hurting a lot more today than usual, reports good FM.  Advised pt to go to [facility] for evaluation

## 2015-09-16 ENCOUNTER — Ambulatory Visit (INDEPENDENT_AMBULATORY_CARE_PROVIDER_SITE_OTHER): Payer: Medicaid Other | Admitting: Women's Health

## 2015-09-16 ENCOUNTER — Encounter: Payer: Self-pay | Admitting: Women's Health

## 2015-09-16 VITALS — BP 122/70 | HR 68 | Wt 214.0 lb

## 2015-09-16 DIAGNOSIS — Z3403 Encounter for supervision of normal first pregnancy, third trimester: Secondary | ICD-10-CM | POA: Diagnosis not present

## 2015-09-16 DIAGNOSIS — Z1389 Encounter for screening for other disorder: Secondary | ICD-10-CM

## 2015-09-16 DIAGNOSIS — Z3A39 39 weeks gestation of pregnancy: Secondary | ICD-10-CM

## 2015-09-16 DIAGNOSIS — Z331 Pregnant state, incidental: Secondary | ICD-10-CM

## 2015-09-16 LAB — POCT URINALYSIS DIPSTICK
GLUCOSE UA: NEGATIVE
Ketones, UA: NEGATIVE
Nitrite, UA: NEGATIVE

## 2015-09-16 NOTE — Patient Instructions (Signed)
Call the office ([phone]) or go to [facility] if:  You begin to have strong, frequent contractions  Your water breaks.  Sometimes it is a big gush of fluid, sometimes it is just a trickle that keeps getting your panties wet or running down your legs  You have vaginal bleeding.  It is normal to have a small amount of spotting if your cervix was checked.   You don't feel your baby moving like normal.  If you don't, get you something to eat and drink and lay down and focus on feeling your baby move.  You should feel at least 10 movements in 2 hours.  If you don't, you should call the office or go to [facility].    Braxton Hicks Contractions Contractions of the uterus can occur throughout pregnancy. Contractions are not always a sign that you are in labor.  WHAT ARE BRAXTON HICKS CONTRACTIONS?  Contractions that occur before labor are called Braxton Hicks contractions, or false labor. Toward the end of pregnancy (32-34 weeks), these contractions can develop more often and may become more forceful. This is not true labor because these contractions do not result in opening (dilatation) and thinning of the cervix. They are sometimes difficult to tell apart from true labor because these contractions can be forceful and people have different pain tolerances. You should not feel embarrassed if you go to the hospital with false labor. Sometimes, the only way to tell if you are in true labor is for your health care provider to look for changes in the cervix. If there are no prenatal problems or other health problems associated with the pregnancy, it is completely safe to be sent home with false labor and await the onset of true labor. HOW CAN YOU TELL THE DIFFERENCE BETWEEN TRUE AND FALSE LABOR? False Labor  The contractions of false labor are usually shorter and not as hard as those of true labor.   The contractions are usually irregular.   The contractions are often felt in the front of  the lower abdomen and in the groin.   The contractions may go away when you walk around or change positions while lying down.   The contractions get weaker and are shorter lasting as time goes on.   The contractions do not usually become progressively stronger, regular, and closer together as with true labor.  True Labor  Contractions in true labor last 30-70 seconds, become very regular, usually become more intense, and increase in frequency.   The contractions do not go away with walking.   The discomfort is usually felt in the top of the uterus and spreads to the lower abdomen and low back.   True labor can be determined by your health care provider with an exam. This will show that the cervix is dilating and getting thinner.  WHAT TO REMEMBER  Keep up with your usual exercises and follow other instructions given by your health care provider.   Take medicines as directed by your health care provider.   Keep your regular prenatal appointments.   Eat and drink lightly if you think you are going into labor.   If Braxton Hicks contractions are making you uncomfortable:   Change your position from lying down or resting to walking, or from walking to resting.   Sit and rest in a tub of warm water.   Drink 2-3 glasses of water. Dehydration may cause these contractions.   Do slow and deep breathing several times an hour.    WHEN SHOULD I SEEK IMMEDIATE MEDICAL CARE? Seek immediate medical care if:  Your contractions become stronger, more regular, and closer together.   You have fluid leaking or gushing from your vagina.   You have a fever.   You pass blood-tinged mucus.   You have vaginal bleeding.   You have continuous abdominal pain.   You have low back pain that you never had before.   You feel your baby's head pushing down and causing pelvic pressure.   Your baby is not moving as much as it used to.    This information is not intended to  replace advice given to you by your health care provider. Make sure you discuss any questions you have with your health care provider.   Document Released: 02/21/2005 Document Revised: 02/26/2013 Document Reviewed: 12/03/2012 Elsevier Interactive Patient Education [employer].

## 2015-09-16 NOTE — Progress Notes (Signed)
Low-risk OB appointment G1P0 [redacted]w[redacted]d Estimated Date of Delivery: 09/22/15 BP 122/70 mmHg  Pulse 68  Wt 214 lb (97.07 kg)  LMP 12/16/2014 (Exact Date)  BP, weight, and urine reviewed.  Refer to obstetrical flow sheet for FH & FHR.  Reports good fm.  Denies regular uc's, lof, vb, or uti s/s. No complaints. Went to [facility] yesterday r/o labor, was still 4cm, so d/c'd. No uc's this am.  SVE per request: 5/80/-2, vtx, BBOW, Offered membrane sweeping, discussed r/b- pt decided to proceed, so membranes swept.  Reviewed labor s/s, fkc. Plan:  Continue routine obstetrical care  F/U in 1wk for OB appointment

## 2015-09-18 ENCOUNTER — Encounter (HOSPITAL_COMMUNITY): Payer: Self-pay | Admitting: *Deleted

## 2015-09-18 ENCOUNTER — Inpatient Hospital Stay (HOSPITAL_COMMUNITY)
Admission: EM | Admit: 2015-09-18 | Discharge: 2015-09-21 | DRG: 775 | Disposition: A | Discharge status: Home / Self Care | Payer: Medicaid Other | Attending: Obstetrics & Gynecology | Admitting: Obstetrics & Gynecology

## 2015-09-18 ENCOUNTER — Encounter: Payer: Self-pay | Admitting: Women's Health

## 2015-09-18 DIAGNOSIS — Z3A39 39 weeks gestation of pregnancy: Secondary | ICD-10-CM | POA: Diagnosis not present

## 2015-09-18 DIAGNOSIS — Z6841 Body Mass Index (BMI) 40.0 and over, adult: Secondary | ICD-10-CM | POA: Diagnosis not present

## 2015-09-18 DIAGNOSIS — M419 Scoliosis, unspecified: Secondary | ICD-10-CM | POA: Diagnosis present

## 2015-09-18 DIAGNOSIS — O99214 Obesity complicating childbirth: Secondary | ICD-10-CM | POA: Diagnosis not present

## 2015-09-18 DIAGNOSIS — Z3403 Encounter for supervision of normal first pregnancy, third trimester: Secondary | ICD-10-CM

## 2015-09-18 DIAGNOSIS — Z3490 Encounter for supervision of normal pregnancy, unspecified, unspecified trimester: Secondary | ICD-10-CM

## 2015-09-18 DIAGNOSIS — O9989 Other specified diseases and conditions complicating pregnancy, childbirth and the puerperium: Secondary | ICD-10-CM | POA: Diagnosis present

## 2015-09-18 DIAGNOSIS — K219 Gastro-esophageal reflux disease without esophagitis: Secondary | ICD-10-CM | POA: Diagnosis not present

## 2015-09-18 DIAGNOSIS — Z833 Family history of diabetes mellitus: Secondary | ICD-10-CM | POA: Diagnosis not present

## 2015-09-18 NOTE — ED Provider Notes (Signed)
CSN: [id number]     Arrival date & time 09/18/15  2206 History  By signing my name below, I, Jody Hansen, attest that this documentation has been prepared under the direction and in the presence of physician practitioner, Nat Christen, MD. Electronically Signed: Dora Hansen, Scribe. 09/18/2015. 10:43 PM.    Chief Complaint  Patient presents with  . Abdominal Cramping    The history is provided by the patient. No language interpreter was used.     HPI Comments:Level V caveat for urgent need for intervention Jody Hansen is a 20 y.o. female who presents to the Emergency Department complaining of sudden onset, intermittent, lower abdominal cramping beginning several hours ago. Pt is pregnant (first pregnancy) and her EDC is 09/22/15. Pt states that she experienced bloody discharge 3 days ago. She notes worsening lower back pain, more frequent contractions, and lower abdominal/pelvic pain with urination over the last 3 days. She also reports that she has been constipated since yesterday. Pt states that she has experienced contractions every 5-6 minutes this evening. Pt went to [facility] two days ago and was told she was dilated to 5 centimeters. She reports that she experienced a gush of water two days ago that soaked through her pants. She notes that the baby has not been moving much today. She denies any other pains or symptoms.  Past Medical History  Diagnosis Date  . Scoliosis   . Pregnant 01/23/2015  . Kidney stones 2017   Past Surgical History  Procedure Laterality Date  . No past surgeries     Family History  Problem Relation Age of Onset  . Diabetes Paternal Grandfather   . Diabetes Paternal Grandmother   . Cancer Maternal Grandmother   . Cancer Maternal Grandfather   . Diabetes Father   . Diabetes Mother   . Diabetes Sister     borderline   Social History  Substance Use Topics  . Smoking status: Never Smoker   . Smokeless tobacco: Never Used  . Alcohol Use:  No   OB History    Gravida Para Term Preterm AB TAB SAB Ectopic Multiple Living   1              Review of Systems  Reason unable to perform ROS: Urgent need for intervention.    A complete 10 system review of systems was obtained and all systems are negative except as noted in the HPI and PMH.   Allergies  Depo-provera  Home Medications   Prior to Admission medications   Medication Sig Start Date End Date Taking? Authorizing Provider  Pediatric Multiple Vit-C-FA (FLINSTONES GUMMIES OMEGA-3 DHA PO) Take 2 tablets by mouth daily.    Historical Provider, MD   BP 136/93 mmHg  Pulse 129  Temp(Src) 98.7 F (37.1 C) (Oral)  Resp 20  Ht 5\' 1"  (1.549 m)  Wt 214 lb (97.07 kg)  BMI 40.46 kg/m2  SpO2 98%  LMP 12/16/2014 (Exact Date) Physical Exam  Constitutional: She is oriented to person, place, and time. She appears well-developed and well-nourished.  HENT:  Head: Normocephalic and atraumatic.  Eyes: Conjunctivae are normal.  Neck: Neck supple.  Cardiovascular: Normal rate and regular rhythm.   Pulmonary/Chest: Effort normal and breath sounds normal.  Abdominal: Soft. Bowel sounds are normal.  Abdomen is gravid. Pelvic Exam: 4-5 cm dilated, 0 station, 50% effaced.  Musculoskeletal: Normal range of motion.  Neurological: She is alert and oriented to person, place, and time.  Skin: Skin is warm and  dry.  Psychiatric: She has a normal mood and affect. Her behavior is normal.  Nursing note and vitals reviewed.   ED Course  Procedures (including critical care time)  DIAGNOSTIC STUDIES: Oxygen Saturation is 98% on RA, normal by my interpretation.    COORDINATION OF CARE: 10:44 PM Will discuss with obstetrician on call. Discussed treatment plan with pt at bedside and pt agreed to plan.  Results for orders placed or performed in visit on 09/16/15  POCT urinalysis dipstick  Result Value Ref Range   Color, UA     Clarity, UA     Glucose, UA neg    Bilirubin, UA      Ketones, UA neg    Spec Grav, UA     Blood, UA moderate    pH, UA     Protein, UA trace    Urobilinogen, UA     Nitrite, UA neg    Leukocytes, UA Trace (A) Negative   No results found.   MDM   Final diagnoses:  Pregnancy   Patient is hemodynamically stable. Discussed with obstetrician on-call at [facility].  Will transfer  I personally performed the services described in this documentation, which was scribed in my presence. The recorded information has been reviewed and is accurate.    Nat Christen, MD 09/18/15 [phone]

## 2015-09-18 NOTE — ED Notes (Addendum)
Pt c/o lower back and lower abdominal pain/cramping since earlier today. Pt also reporting bloody discharge. Pt states she was told to go to [facility] but has been there several times before and she wasn't in real labor so she wanted to come here and be checked to see if she is in labor. Pt states she was having contractions every 5-6 mins earlier (timed from 8:15pm- 9:15pm). [facility] a 5 on Wednesday.

## 2015-09-18 NOTE — ED Notes (Signed)
Pt states she began having contractions q "5-7 minutes lasting 1-2 minutes" since yesterday

## 2015-09-18 NOTE — Progress Notes (Signed)
2223 Received call regarding this 20 yo G1P0  in with complaint of lower abdominal pain and bloody show. 2245 SVE by EDP , no cervical change reported from 5/80 on 7/12.  2250  Dr. Ihor Dow has spoken with EDP and accepted transfer of patient to MAU.

## 2015-09-19 ENCOUNTER — Inpatient Hospital Stay (HOSPITAL_COMMUNITY): Payer: Medicaid Other | Admitting: Anesthesiology

## 2015-09-19 ENCOUNTER — Encounter (HOSPITAL_COMMUNITY): Payer: Self-pay

## 2015-09-19 DIAGNOSIS — O9989 Other specified diseases and conditions complicating pregnancy, childbirth and the puerperium: Secondary | ICD-10-CM | POA: Diagnosis present

## 2015-09-19 DIAGNOSIS — O99214 Obesity complicating childbirth: Secondary | ICD-10-CM | POA: Diagnosis present

## 2015-09-19 DIAGNOSIS — M419 Scoliosis, unspecified: Secondary | ICD-10-CM | POA: Diagnosis present

## 2015-09-19 DIAGNOSIS — Z833 Family history of diabetes mellitus: Secondary | ICD-10-CM | POA: Diagnosis not present

## 2015-09-19 DIAGNOSIS — Z3A39 39 weeks gestation of pregnancy: Secondary | ICD-10-CM

## 2015-09-19 DIAGNOSIS — Z6841 Body Mass Index (BMI) 40.0 and over, adult: Secondary | ICD-10-CM | POA: Diagnosis not present

## 2015-09-19 DIAGNOSIS — K219 Gastro-esophageal reflux disease without esophagitis: Secondary | ICD-10-CM | POA: Diagnosis present

## 2015-09-19 DIAGNOSIS — Z3403 Encounter for supervision of normal first pregnancy, third trimester: Secondary | ICD-10-CM | POA: Diagnosis present

## 2015-09-19 LAB — TYPE AND SCREEN
ABO/RH(D): A POS
Antibody Screen: NEGATIVE

## 2015-09-19 LAB — CBC
HCT: 32 % — ABNORMAL LOW (ref 36.0–46.0)
Hemoglobin: 10.8 g/dL — ABNORMAL LOW (ref 12.0–15.0)
MCH: 30.3 pg (ref 26.0–34.0)
MCHC: 33.8 g/dL (ref 30.0–36.0)
MCV: 89.9 fL (ref 78.0–100.0)
PLATELETS: 187 10*3/uL (ref 150–400)
RBC: 3.56 MIL/uL — AB (ref 3.87–5.11)
RDW: 14.1 % (ref 11.5–15.5)
WBC: 13.8 10*3/uL — ABNORMAL HIGH (ref 4.0–10.5)

## 2015-09-19 LAB — RPR: RPR: NONREACTIVE

## 2015-09-19 LAB — ABO/RH: ABO/RH(D): A POS

## 2015-09-19 MED ORDER — WITCH HAZEL-GLYCERIN EX PADS
1.0000 "application " | MEDICATED_PAD | CUTANEOUS | Status: DC | PRN
  Administered 2015-09-19 – 2015-09-21 (×2): 1 via TOPICAL

## 2015-09-19 MED ORDER — ONDANSETRON HCL 4 MG/2ML IJ SOLN: 4.0000 mg | Freq: Four times a day (QID) | INTRAMUSCULAR | Status: DC | PRN

## 2015-09-19 MED ORDER — ZOLPIDEM TARTRATE 5 MG PO TABS: 5.0000 mg | ORAL_TABLET | Freq: Every evening | ORAL | Status: DC | PRN

## 2015-09-19 MED ORDER — GENTAMICIN SULFATE 40 MG/ML IJ SOLN
Freq: Three times a day (TID) | INTRAVENOUS | Status: DC
  Administered 2015-09-20 – 2015-09-21 (×4): via INTRAVENOUS
  Filled 2015-09-19 (×6): qty 3.75

## 2015-09-19 MED ORDER — LACTATED RINGERS IV SOLN: 500.0000 mL | Freq: Once | INTRAVENOUS | Status: DC

## 2015-09-19 MED ORDER — OXYTOCIN 40 UNITS IN LACTATED RINGERS INFUSION - SIMPLE MED: 2.5000 [IU]/h | INTRAVENOUS | Status: DC

## 2015-09-19 MED ORDER — PRENATAL MULTIVITAMIN CH
1.0000 | ORAL_TABLET | Freq: Every day | ORAL | Status: DC
  Administered 2015-09-20 – 2015-09-21 (×2): 1 via ORAL
  Filled 2015-09-19 (×2): qty 1

## 2015-09-19 MED ORDER — TERBUTALINE SULFATE 1 MG/ML IJ SOLN
0.2500 mg | Freq: Once | INTRAMUSCULAR | Status: DC | PRN
Start: 2015-09-19 — End: 2015-09-19
  Filled 2015-09-19: qty 1

## 2015-09-19 MED ORDER — ACETAMINOPHEN 325 MG PO TABS
650.0000 mg | ORAL_TABLET | ORAL | Status: DC | PRN
  Administered 2015-09-19: 650 mg via ORAL
  Filled 2015-09-19: qty 2

## 2015-09-19 MED ORDER — GENTAMICIN SULFATE 40 MG/ML IJ SOLN
Freq: Once | INTRAVENOUS | Status: AC
  Administered 2015-09-19: via INTRAVENOUS
  Filled 2015-09-19: qty 4.25

## 2015-09-19 MED ORDER — GENTAMICIN SULFATE 40 MG/ML IJ SOLN
Freq: Once | INTRAVENOUS | Status: DC
  Filled 2015-09-19: qty 4.25

## 2015-09-19 MED ORDER — COCONUT OIL OIL: 1.0000 "application " | TOPICAL_OIL | Status: DC | PRN

## 2015-09-19 MED ORDER — SENNOSIDES-DOCUSATE SODIUM 8.6-50 MG PO TABS
2.0000 | ORAL_TABLET | ORAL | Status: DC
  Administered 2015-09-19 – 2015-09-20 (×2): 2 via ORAL
  Filled 2015-09-19 (×2): qty 2

## 2015-09-19 MED ORDER — ONDANSETRON HCL 4 MG/2ML IJ SOLN: 4.0000 mg | INTRAMUSCULAR | Status: DC | PRN

## 2015-09-19 MED ORDER — LIDOCAINE HCL (PF) 1 % IJ SOLN
INTRAMUSCULAR | Status: DC | PRN
  Administered 2015-09-19: 6 mL via EPIDURAL
  Administered 2015-09-19: 4 mL via EPIDURAL

## 2015-09-19 MED ORDER — OXYTOCIN BOLUS FROM INFUSION: 500.0000 mL | INTRAVENOUS | Status: DC

## 2015-09-19 MED ORDER — DIPHENHYDRAMINE HCL 50 MG/ML IJ SOLN: 12.5000 mg | INTRAMUSCULAR | Status: DC | PRN

## 2015-09-19 MED ORDER — OXYCODONE-ACETAMINOPHEN 5-325 MG PO TABS
1.0000 | ORAL_TABLET | ORAL | Status: DC | PRN
Start: 2015-09-19 — End: 2015-09-19

## 2015-09-19 MED ORDER — BENZOCAINE-MENTHOL 20-0.5 % EX AERO
1.0000 "application " | INHALATION_SPRAY | CUTANEOUS | Status: DC | PRN
  Administered 2015-09-20 – 2015-09-21 (×2): 1 via TOPICAL
  Filled 2015-09-19 (×3): qty 56

## 2015-09-19 MED ORDER — OXYTOCIN 40 UNITS IN LACTATED RINGERS INFUSION - SIMPLE MED
1.0000 m[IU]/min | INTRAVENOUS | Status: DC
  Administered 2015-09-19: 2 m[IU]/min via INTRAVENOUS
  Filled 2015-09-19: qty 1000

## 2015-09-19 MED ORDER — FENTANYL 2.5 MCG/ML BUPIVACAINE 1/10 % EPIDURAL INFUSION (WH - ANES)
14.0000 mL/h | INTRAMUSCULAR | Status: DC | PRN
  Administered 2015-09-19: 12 mL/h via EPIDURAL
  Administered 2015-09-19: 10 mL/h via EPIDURAL
  Filled 2015-09-19 (×2): qty 125

## 2015-09-19 MED ORDER — CLINDAMYCIN PHOSPHATE 900 MG/50ML IV SOLN: 900.0000 mg | Freq: Three times a day (TID) | INTRAVENOUS | Status: DC

## 2015-09-19 MED ORDER — EPHEDRINE 5 MG/ML INJ
10.0000 mg | INTRAVENOUS | Status: DC | PRN
  Filled 2015-09-19: qty 2

## 2015-09-19 MED ORDER — IBUPROFEN 600 MG PO TABS
600.0000 mg | ORAL_TABLET | Freq: Four times a day (QID) | ORAL | Status: DC
  Administered 2015-09-19 – 2015-09-21 (×7): 600 mg via ORAL
  Filled 2015-09-19 (×7): qty 1

## 2015-09-19 MED ORDER — FENTANYL CITRATE (PF) 100 MCG/2ML IJ SOLN
100.0000 ug | INTRAMUSCULAR | Status: DC | PRN
  Administered 2015-09-19: 100 ug via INTRAVENOUS
  Filled 2015-09-19: qty 2

## 2015-09-19 MED ORDER — OXYCODONE-ACETAMINOPHEN 5-325 MG PO TABS
2.0000 | ORAL_TABLET | ORAL | Status: DC | PRN
Start: 2015-09-19 — End: 2015-09-19

## 2015-09-19 MED ORDER — PHENYLEPHRINE 40 MCG/ML (10ML) SYRINGE FOR IV PUSH (FOR BLOOD PRESSURE SUPPORT)
80.0000 ug | PREFILLED_SYRINGE | INTRAVENOUS | Status: DC | PRN
  Filled 2015-09-19: qty 5
  Filled 2015-09-19: qty 10

## 2015-09-19 MED ORDER — LACTATED RINGERS IV SOLN: 500.0000 mL | INTRAVENOUS | Status: DC | PRN

## 2015-09-19 MED ORDER — ONDANSETRON HCL 4 MG PO TABS: 4.0000 mg | ORAL_TABLET | ORAL | Status: DC | PRN

## 2015-09-19 MED ORDER — TETANUS-DIPHTH-ACELL PERTUSSIS 5-2.5-18.5 LF-MCG/0.5 IM SUSP: 0.5000 mL | Freq: Once | INTRAMUSCULAR | Status: DC

## 2015-09-19 MED ORDER — DIBUCAINE 1 % RE OINT: 1.0000 "application " | TOPICAL_OINTMENT | RECTAL | Status: DC | PRN

## 2015-09-19 MED ORDER — LIDOCAINE HCL (PF) 1 % IJ SOLN
30.0000 mL | INTRAMUSCULAR | Status: DC | PRN
  Administered 2015-09-19: 30 mL via SUBCUTANEOUS
  Filled 2015-09-19: qty 30

## 2015-09-19 MED ORDER — PHENYLEPHRINE 40 MCG/ML (10ML) SYRINGE FOR IV PUSH (FOR BLOOD PRESSURE SUPPORT)
80.0000 ug | PREFILLED_SYRINGE | INTRAVENOUS | Status: DC | PRN
Start: 2015-09-19 — End: 2015-09-19
  Filled 2015-09-19: qty 5

## 2015-09-19 MED ORDER — SOD CITRATE-CITRIC ACID 500-334 MG/5ML PO SOLN: 30.0000 mL | ORAL | Status: DC | PRN

## 2015-09-19 MED ORDER — FENTANYL CITRATE (PF) 100 MCG/2ML IJ SOLN
100.0000 ug | Freq: Once | INTRAMUSCULAR | Status: AC
  Administered 2015-09-19: 100 ug via INTRAVENOUS
  Filled 2015-09-19: qty 2

## 2015-09-19 MED ORDER — LACTATED RINGERS IV SOLN
INTRAVENOUS | Status: DC
  Administered 2015-09-19 (×2): via INTRAVENOUS

## 2015-09-19 MED ORDER — PHENYLEPHRINE 40 MCG/ML (10ML) SYRINGE FOR IV PUSH (FOR BLOOD PRESSURE SUPPORT)
80.0000 ug | PREFILLED_SYRINGE | INTRAVENOUS | Status: DC | PRN
  Filled 2015-09-19: qty 5

## 2015-09-19 MED ORDER — FLEET ENEMA 7-19 GM/118ML RE ENEM: 1.0000 | ENEMA | RECTAL | Status: DC | PRN

## 2015-09-19 MED ORDER — ACETAMINOPHEN 325 MG PO TABS: 650.0000 mg | ORAL_TABLET | ORAL | Status: DC | PRN

## 2015-09-19 MED ORDER — DEXTROSE 5 % IV SOLN: Freq: Three times a day (TID) | INTRAVENOUS | Status: DC

## 2015-09-19 MED ORDER — SIMETHICONE 80 MG PO CHEW: 80.0000 mg | CHEWABLE_TABLET | ORAL | Status: DC | PRN

## 2015-09-19 MED ORDER — DIPHENHYDRAMINE HCL 25 MG PO CAPS: 25.0000 mg | ORAL_CAPSULE | Freq: Four times a day (QID) | ORAL | Status: DC | PRN

## 2015-09-19 NOTE — Progress Notes (Signed)
ANTIBIOTIC CONSULT NOTE - INITIAL  Pharmacy Consult for Gentamicin Indication: Post partum Endometritis  Allergies  Allergen Reactions  . Depo-Provera [Medroxyprogesterone Acetate] Other (See Comments)    Weight gain; bruising; headaches    Patient Measurements: Height: 5\' 1"  (154.9 cm) Weight: 214 lb (97.07 kg) IBW/kg (Calculated) : 47.8 Adjusted Body Weight: 62.6 Kg  Vital Signs: Temp: 99.5 F (37.5 C) (07/15 2130) Temp Source: Oral (07/15 2130) BP: 132/70 mmHg (07/15 2130) Pulse Rate: 114 (07/15 2130)  Labs:  Recent Labs  09/19/15 0612  WBC 13.8*  HGB 10.8*  PLT 187   No results for input(s): GENTTROUGH, GENTPEAK, GENTRANDOM in the last 72 hours.   Microbiology: Recent Results (from the past 720 hour(s))  Strep Gp B NAA     Status: None   Collection Time: 09/01/15  2:00 PM  Result Value Ref Range Status   Strep Gp B NAA Negative Negative Final    Comment: Centers for Disease Control and Prevention (CDC) and American Congress of Obstetricians and Gynecologists (ACOG) guidelines for prevention of perinatal group B streptococcal (GBS) disease specify co-collection of a vaginal and rectal swab specimen to maximize sensitivity of GBS detection. Per the CDC and ACOG, swabbing both the lower vagina and rectum substantially increases the yield of detection compared with sampling the vagina alone. Penicillin G, ampicillin, or cefazolin are indicated for intrapartum prophylaxis of perinatal GBS colonization. Reflex susceptibility testing should be performed prior to use of clindamycin only on GBS isolates from penicillin-allergic women who are considered a high risk for anaphylaxis. Treatment with vancomycin without additional testing is warranted if resistance to clindamycin is noted.   GC/Chlamydia Probe Amp     Status: None   Collection Time: 09/01/15  2:00 PM  Result Value Ref Range Status   Chlamydia trachomatis, NAA Negative Negative Final   Neisseria  gonorrhoeae by PCR Negative Negative Final  OB RESULT CONSOLE Group B Strep     Status: None   Collection Time: 09/03/15 12:00 AM  Result Value Ref Range Status   GBS Negative  Final    Medications:  Clindamycin 900 mgm IV every 8 hours; first dose with gentamicin at 2300.  Assessment: 20 y.o. female G1P1001 at 103w4d; now s/p NSVD at 1906 09/19/15, with increased temperature. Will start gentamicin with clindamycin for presumed post partum endometritis.  Estimated Ke = 0.250, Vd = 25.0 Liters  Goal of Therapy:  Gentamicin peak 6-8 mg/L and Trough < 1 mg/L  Plan:  Gentamicin 170 mg IV x 1  Gentamicin 150 mg IV every 8 hrs  Check Scr with next labs if gentamicin continued. Will check gentamicin levels if continued > 72hr or clinically indicated.  Norberto Sorenson 09/19/2015,11:04 PM

## 2015-09-19 NOTE — Progress Notes (Signed)
LABOR PROGRESS NOTE  Jody Hansen is a 20 y.o. G1P0 at [redacted]w[redacted]d  admitted for SOL this AM.  Subjective: Doing well, feeling some contractions. S/P epidural. No other complaints.   Objective: BP 121/81 mmHg  Pulse 109  Temp(Src) 98.3 F (36.8 C) (Oral)  Resp 20  Ht 5\' 1"  (1.549 m)  Wt 214 lb (97.07 kg)  BMI 40.46 kg/m2  SpO2 99%  LMP 12/16/2014 (Exact Date) or  Filed Vitals:   09/19/15 0921 09/19/15 0922 09/19/15 0932 09/19/15 0950  BP: 78/47 100/50 73/57 121/81  Pulse: 132 97 138 109  Temp:      TempSrc:      Resp: 18 18 18 20   Height:      Weight:      SpO2:        140/mod var/reactive  Dilation: 7 Effacement (%): 90 Cervical Position: Middle Station: -2 Presentation: Vertex Exam by:: Dr. Baron Sane  Labs: Lab Results  Component Value Date   WBC 13.8* 09/19/2015   HGB 10.8* 09/19/2015   HCT 32.0* 09/19/2015   MCV 89.9 09/19/2015   PLT 187 09/19/2015    Patient Active Problem List   Diagnosis Date Noted  . Normal labor 09/19/2015  . Ganglion cyst 08/10/2015  . Susceptible to varicella (non-immune), currently pregnant 02/17/2015  . Supervision of normal first pregnancy 01/23/2015  . Backache 07/03/2012  . Acne 07/03/2012  . Renal colic on right side [id number]    Assessment / Plan: 20 y.o. G1P0 at [redacted]w[redacted]d here for SOL. S/P SROM at 8AM. IUPC placed and forebag broke  Labor: SOL Fetal Wellbeing:  Category 1 Pain Control:  epidural Anticipated MOD:  SVD  Jacquiline Doe, MD 09/19/2015, 9:56 AM

## 2015-09-19 NOTE — Anesthesia Pain Management Evaluation Note (Signed)
  CRNA Pain Management Visit Note  Patient: Jody Hansen, 20 y.o., female  "Hello I am a member of the anesthesia team at [facility]. We have an anesthesia team available at all times to provide care throughout the hospital, including epidural management and anesthesia for C-section. I don't know your plan for the delivery whether it a natural birth, water birth, IV sedation, nitrous supplementation, doula or epidural, but we want to meet your pain goals."   1.Was your pain managed to your expectations on prior hospitalizations?   No prior hospitalizations  2.What is your expectation for pain management during this hospitalization?     Epidural  3.How can we help you reach that goal? epidural  Record the patient's initial score and the patient's pain goal.   Pain: 0  Pain Goal: 2 [facility] wants you to be able to say your pain was always managed very well.  Larry Alcock 09/19/2015

## 2015-09-19 NOTE — H&P (Signed)
Jody Hansen is a 20 y.o. female G1 @ 39.4wks by LMP and confirmed by 7wk scan presenting for reg ctx. Reports +bldy show but denies leaking fluid. Was brought to [facility] from [facility] for further eval of labor. Has had advanced cx change to 5cm x 3d and was 5cm at AP. During her obs in MAU she was noted to have changed to 6cm.  History OB History    Gravida Para Term Preterm AB TAB SAB Ectopic Multiple Living   1              Past Medical History  Diagnosis Date  . Scoliosis   . Pregnant 01/23/2015  . Kidney stones 2017   Past Surgical History  Procedure Laterality Date  . No past surgeries     Family History: family history includes Cancer in her maternal grandfather and maternal grandmother; Diabetes in her father, mother, paternal grandfather, paternal grandmother, and sister. Social History:  reports that she has never smoked. She has never used smokeless tobacco. She reports that she does not drink alcohol or use illicit drugs.   Prenatal Transfer Tool  Maternal Diabetes: No Genetic Screening: Normal Maternal Ultrasounds/Referrals: Normal Fetal Ultrasounds or other Referrals:  None Maternal Substance Abuse:  No Significant Maternal Medications:  None Significant Maternal Lab Results:  Lab values include: Group B Strep negative Other Comments:  None  ROS  Dilation: 5.5 Effacement (%): 70 Station: -2 Exam by:: :arrabee RN Blood pressure 140/94, pulse 101, temperature 98.7 F (37.1 C), temperature source Oral, resp. rate 18, height 5\' 1"  (1.549 m), weight 97.07 kg (214 lb), last menstrual period 12/16/2014, SpO2 99 %.  BPs: 123/60, 140/78, 141/88, 127/63 (140/94 during a ctx)  Exam Physical Exam  Constitutional: She is oriented to person, place, and time. She appears well-developed.  HENT:  Head: Normocephalic.  Neck: Normal range of motion.  Cardiovascular: Normal rate.   Respiratory: Effort normal.  GI:  EFM 130s, +accels, no decels Ctx q 2-4 mins   Musculoskeletal: Normal range of motion.  Neurological: She is alert and oriented to person, place, and time.  Skin: Skin is warm and dry.  Psychiatric: She has a normal mood and affect. Her behavior is normal. Thought content normal.    Prenatal labs: ABO, Rh: --/--/A POS (07/15 0612) Antibody: NEG (07/15 0612) Rubella: 2.03 (12/07 1446) RPR: Non Reactive (04/24 0919)  HBsAg: Negative (12/07 1446)  HIV: Non Reactive (04/24 0919)  GBS: Negative (06/27 1400)   Assessment/Plan: IUP@term  Advanced cx change vs latent labor GBS neg  Admit to [facility] for augmentation via AROM/Pit Anticipate SVD   Serita Grammes CNM 09/19/2015, 7:46 AM

## 2015-09-19 NOTE — MAU Note (Signed)
Contractions got stronger at 9 pm.  Bloody show earlier. Baby not moving like she usually does today.  Felt baby move at other hospital earlier.  [facility] from [place]

## 2015-09-19 NOTE — Anesthesia Preprocedure Evaluation (Signed)
Anesthesia Evaluation  Patient identified by MRN, date of birth, ID band Patient awake    Reviewed: Allergy & Precautions, NPO status , Patient's Chart, lab work & pertinent test results  History of Anesthesia Complications Negative for: history of anesthetic complications  Airway Mallampati: III  TM Distance: >3 FB Neck ROM: Full    Dental  (+) Dental Advisory Given   Pulmonary neg pulmonary ROS,    breath sounds clear to auscultation       Cardiovascular negative cardio ROS   Rhythm:Regular Rate:Normal     Neuro/Psych negative neurological ROS     GI/Hepatic Neg liver ROS, GERD  Poorly Controlled,  Endo/Other  Morbid obesity  Renal/GU negative Renal ROS     Musculoskeletal   Abdominal (+) + obese,   Peds  Hematology negative hematology ROS (+) plt 187K   Anesthesia Other Findings   Reproductive/Obstetrics (+) Pregnancy                             Anesthesia Physical Anesthesia Plan  ASA: II  Anesthesia Plan: Epidural   Post-op Pain Management:    Induction:   Airway Management Planned: Natural Airway  Additional Equipment:   Intra-op Plan:   Post-operative Plan:   Informed Consent: I have reviewed the patients History and Physical, chart, labs and discussed the procedure including the risks, benefits and alternatives for the proposed anesthesia with the patient or authorized representative who has indicated his/her understanding and acceptance.   Dental advisory given  Plan Discussed with:   Anesthesia Plan Comments: (Patient identified. Risks/Benefits/Options discussed with patient including but not limited to bleeding, infection, nerve damage, paralysis, failed block, incomplete pain control, headache, blood pressure changes, nausea, vomiting, reactions to medication both or allergic, itching and postpartum back pain. Confirmed with bedside nurse the patient's most  recent platelet count. Confirmed with patient that they are not currently taking any anticoagulation, have any bleeding history or any family history of bleeding disorders. Patient expressed understanding and wished to proceed. All questions were answered.  )        Anesthesia Quick Evaluation

## 2015-09-19 NOTE — Anesthesia Procedure Notes (Signed)
Epidural Patient location during procedure: OB Start time: 09/19/2015 8:30 AM End time: 09/19/2015 8:56 AM  Staffing Anesthesiologist: Annye Asa Performed by: anesthesiologist   Preanesthetic Checklist Completed: patient identified, surgical consent, pre-op evaluation, timeout performed, IV checked, risks and benefits discussed and monitors and equipment checked  Epidural Patient position: sitting Prep: site prepped and draped and DuraPrep Patient monitoring: blood pressure, continuous pulse ox and heart rate Approach: midline Location: L2-L3 Injection technique: LOR air  Needle:  Needle type: Tuohy  Needle gauge: 17 G Needle length: 9 cm Needle insertion depth: 8 cm Catheter at skin depth: 14 cm Test dose: negative (1% lidocaine)  Additional Notes Pt identified in Labor room.  Monitors applied. Working IV access confirmed. Sterile prep, drape lumbar spine.  1% lido local L 2,3.  #17ga Touhy LOR air at 8cm L 2,3, cath in easily to 14 cm skin. Test dose OK, cath dosed and infusion begun.  Patient asymptomatic, VSS, no heme aspirated, tolerated well.  Jenita Seashore, MD Reason for block:procedure for pain

## 2015-09-20 DIAGNOSIS — Z3A39 39 weeks gestation of pregnancy: Secondary | ICD-10-CM

## 2015-09-20 DIAGNOSIS — K219 Gastro-esophageal reflux disease without esophagitis: Secondary | ICD-10-CM

## 2015-09-20 DIAGNOSIS — M419 Scoliosis, unspecified: Secondary | ICD-10-CM

## 2015-09-20 DIAGNOSIS — O9989 Other specified diseases and conditions complicating pregnancy, childbirth and the puerperium: Secondary | ICD-10-CM

## 2015-09-20 DIAGNOSIS — O99214 Obesity complicating childbirth: Secondary | ICD-10-CM

## 2015-09-20 LAB — CBC
HCT: 28.8 % — ABNORMAL LOW (ref 36.0–46.0)
Hemoglobin: 9.6 g/dL — ABNORMAL LOW (ref 12.0–15.0)
MCH: 30 pg (ref 26.0–34.0)
MCHC: 33.3 g/dL (ref 30.0–36.0)
MCV: 90 fL (ref 78.0–100.0)
Platelets: 191 10*3/uL (ref 150–400)
RBC: 3.2 MIL/uL — ABNORMAL LOW (ref 3.87–5.11)
RDW: 14.2 % (ref 11.5–15.5)
WBC: 17.4 10*3/uL — ABNORMAL HIGH (ref 4.0–10.5)

## 2015-09-20 MED ORDER — MENTHOL 3 MG MT LOZG
1.0000 | LOZENGE | OROMUCOSAL | Status: DC | PRN
  Filled 2015-09-20: qty 9

## 2015-09-20 NOTE — Progress Notes (Signed)
Post Partum Day 1 Subjective: no complaints, up ad lib, voiding, tolerating PO and + flatus  Objective: Blood pressure 120/66, pulse 76, temperature 98.6 F (37 C), temperature source Oral, resp. rate 18, height 5\' 1"  (1.549 m), weight 214 lb (97.07 kg), last menstrual period 12/16/2014, SpO2 99 %, unknown if currently breastfeeding.  Physical Exam:  General: alert, cooperative and no distress Lochia: appropriate Uterine Fundus: firm Incision: n/a DVT Evaluation: No evidence of DVT seen on physical exam. Negative Homan's sign. No cords or calf tenderness. No significant calf/ankle edema.   Recent Labs  09/19/15 0612 09/20/15 0537  HGB 10.8* 9.6*  HCT 32.0* 28.8*    Assessment/Plan: Plan for discharge tomorrow. Afebrile since 2230, abd soft, nontender.   LOS: 1 day   Koren Shiver 09/20/2015, 7:19 AM

## 2015-09-20 NOTE — Lactation Note (Signed)
This note was copied from a baby's chart. Lactation Consultation Note  Patient Name: Jody Hansen [id number] Date: 09/20/2015 Reason for consult: Initial assessment;NICU baby NICU baby 22 hours old. Mom reports that she came into hospital wanting to formula feed. However, while holding baby in NICU, baby was rooting around at breast and mom reports that she decided to let the baby latch so that she would at least get the colostrum. Mom states that she felt the colostrum helped the baby to want to eat. Discussed benefits of breast milk with mom. Mom reports that she does not want to hand express her breast, but she does want to pump so that she can give the baby milk either with bottle or at breast.   Set mom up with DEBP, reviewed assembly and cleaning. Reviewed EBM storage guidelines, and enc mom to take EBM to NICU as she is able. Enc mom to pump every 2-3 hours for a total of 8 times/24 hours. Discussed benefits of hand expression, but mom reported discomfort with the idea. Enc mom to call for assistance with pumping as needed. Mom given NICU booklet and Baby and Me brochure with review. Mom aware of pumping rooms in NICU, and states that she will call [facility] office for a pump.  Maternal Data Has patient been taught Hand Expression?:  (Mom states she does not want to hand express her breasts.) Does the patient have breastfeeding experience prior to this delivery?: No  Feeding Feeding Type: Formula Nipple Type: Slow - flow Length of feed: 15 min  LATCH Score/Interventions                      Lactation Tools Discussed/Used WIC Program: Yes Pump Review: Setup, frequency, and cleaning;Milk Storage Initiated by:: JW Date initiated:: 09/20/15   Consult Status Consult Status: Follow-up Date: 09/21/15 Follow-up type: In-patient    Inocente Salles 09/20/2015, 11:12 PM

## 2015-09-20 NOTE — Progress Notes (Signed)
Patient c/o scratchy, dry throat. Notified M. Kellie Simmering. New order for cepacol lozenges. Laurina Bustle, RN

## 2015-09-20 NOTE — Anesthesia Postprocedure Evaluation (Signed)
Anesthesia Post Note  Patient: Jody Hansen  Procedure(s) Performed: * No procedures listed *  Patient location during evaluation: Mother Baby Anesthesia Type: Epidural Level of consciousness: awake and alert, oriented and patient cooperative Pain management: pain level controlled Vital Signs Assessment: post-procedure vital signs reviewed and stable Respiratory status: spontaneous breathing Cardiovascular status: stable Postop Assessment: no headache, epidural receding, patient able to bend at knees and no signs of nausea or vomiting Anesthetic complications: no Comments: Denies pain.     Last Vitals:  Filed Vitals:   09/19/15 2230 09/20/15 0347  BP: 126/71 120/66  Pulse: 150 76  Temp: 38.1 C 37 C  Resp: 20 18    Last Pain:  Filed Vitals:   09/20/15 0420  PainSc: 1    Pain Goal: Patients Stated Pain Goal: 0 (09/19/15 0416)               Rico Sheehan

## 2015-09-20 NOTE — Progress Notes (Addendum)
Called Smith CNM regarding patient's temp of 100.5 and heart rate of 150. Bp was 126/71. Patient had pain with movement.   Orders were given to start patient on antibiotics.

## 2015-09-21 MED ORDER — IBUPROFEN 600 MG PO TABS: 600.0000 mg | ORAL_TABLET | Freq: Four times a day (QID) | ORAL | Status: DC

## 2015-09-21 NOTE — Clinical Social Work Maternal (Signed)
  CLINICAL SOCIAL WORK MATERNAL/CHILD NOTE  Patient Details  Name: Jody Jody Hansen MRN: [id number] Date of Birth: 1995/06/27  Date:  09/21/2015  Clinical Social Worker Initiating Note:  Jody Jody Hansen, Jody Jody Hansen Date/ Time Initiated:  09/21/15/1449     Child's Name:  Jody Jody Hansen   Legal Guardian:  Mother and Father Jody Jody Hansen  Need for Interpreter:  None   Date of Referral:  09/21/15     Reason for Referral:  Other (Comment) NICU admission, concerns about transportation.   Referral Source:  NICU screening  Address:  [place], [place] [address]  Phone number:  [phone]   Household Members:  Self, Significant Other   Natural Supports (not living in the home):  Extended Family, Immediate Family   Professional Supports: Other (Comment) Jody Hansen has connected with local resources in [place]  Employment:   Both parents working, but [employer] has been helping with rides to work for parents.     Education:  High school  Financial Resources:  Medicaid   Other Resources:  [facility]   Cultural/Religious Considerations Which May Impact Care:  none noted  Strengths:  Ability to meet basic needs , Pediatrician chosen , Home prepared for child , Understanding of illness   Risk Factors/Current Problems:  Transportation  is biggest issue as they do not have a car.   Cognitive State:  Goal Oriented    Mood/Affect:  Comfortable , Flat    CSW Assessment:  CSW met with both parents of baby, MGM, Jody Jody Hansen and Paternal grandparents in [place] postpartum room to check in as concerns about transportation were brought forward. No other risk factors or need for CSW intervention noted through chart review. FOB involved and appears very supportive of Jody Hansen. They live together in their own apartment. THey report to have all items in place for baby and are eager to take her home. Parents and family all live in [place] and transportation is a challenge due to lack of  access to cars. PGM reports she can help with transportation some and Jody Hansen to stay with family locally in [place] and get ride to hospital tomorrow. Family asking for other options to stay. CSW explained lack of Jody Jody Hansen and that [employer] is for families of critically ill babies. They stated understanding, but are open to gas card assistance and plan to see if family can help with rides. CSW unaware of additional options for transportation currently. Jody Hansen appears to be coping well and appears to have good support. CSW reviewed common emotions and coping techniques for stay in NICU. Jody Hansen states she is fine, MGM seems to be quite anxious and concerned. CSW to follow.   CSW Plan/Description:  Information/Referral to [employer] , Psychosocial Support and Ongoing Assessment of Needs, Patient/Family Education    [place], Jody Hansen  Coverning NICU for [employer], [place] 09/21/2015, 2:54 PM

## 2015-09-21 NOTE — Lactation Note (Signed)
This note was copied from a baby's chart. Lactation Consultation Note  Patient Name: Girl Cidnee Nishiyama [id number] Date: 09/21/2015 Reason for consult: Follow-up assessment;NICU baby   Follow up with mom of NICU infant. Mom reports she pumped once this morning and did not get anything. She reports she is not hand expressing. She declined a pump rental and plans to get one from [facility]. She reports she was shown how to pump manually, single and double. She denies any questions/concerns at this time.    Maternal Data Does the patient have breastfeeding experience prior to this delivery?: No  Feeding    LATCH Score/Interventions                      Lactation Tools Discussed/Used WIC Program: Yes [facility])   Consult Status Consult Status: PRN Follow-up type: Call as needed    Donn Pierini 09/21/2015, 11:04 AM

## 2015-09-21 NOTE — Discharge Summary (Signed)
OB Discharge Summary  Patient Name: Jody Hansen DOB: 17-Jan-1996 MRN: [id number]  Date of admission: 09/18/2015 Delivering MD: Waldemar Dickens   Date of discharge: 09/21/2015  Admitting diagnosis: Pregnancy [Z33.1] Intrauterine pregnancy: [redacted]w[redacted]d     Secondary diagnosis:Active Problems:   Normal labor  Additional problems:none     Discharge diagnosis: Term Pregnancy Delivered                                                                     Post partum procedures:n/a  Augmentation: none  Complications: None  Hospital course:  Onset of Labor With Vaginal Delivery     20 y.o. yo G1P1001 at [redacted]w[redacted]d was admitted in Active Labor on 09/18/2015. Patient had an uncomplicated labor course as follows:  Membrane Rupture Time/Date: 7:45 AM ,09/19/2015   Intrapartum Procedures: Episiotomy: None [1]                                         Lacerations:  1st degree [2];Labial [10]  Patient had a delivery of a Viable infant. 09/19/2015  Information for the patient's newborn:  Krystan, Stearnes [id number]  Delivery Method: Vaginal, Spontaneous Delivery (Filed from Delivery Summary)    Pateint had an uncomplicated postpartum course.  She is ambulating, tolerating a regular diet, passing flatus, and urinating well. Patient is discharged home in stable condition on 09/21/2015.    Physical exam  Filed Vitals:   09/19/15 0009 09/20/15 0347 09/20/15 0855 09/20/15 1907  BP:  120/66 107/47 117/60  Pulse:  76 73 78  Temp:  98.6 F (37 C) 98 F (36.7 C) 98 F (36.7 C)  TempSrc:  Oral Oral Oral  Resp:  18 18 16   Height:      Weight:      SpO2: 99%   98%   General: alert, cooperative and no distress Lochia: appropriate Uterine Fundus: firm Incision: N/A DVT Evaluation: No evidence of DVT seen on physical exam. Negative Homan's sign. No cords or calf tenderness. Labs: Lab Results  Component Value Date   WBC 17.4* 09/20/2015   HGB 9.6* 09/20/2015   HCT 28.8*  09/20/2015   MCV 90.0 09/20/2015   PLT 191 09/20/2015   CMP Latest Ref Rng 05/30/2015  Glucose 65 - 99 mg/dL 108(H)  BUN 6 - 20 mg/dL 11  Creatinine 0.44 - 1.00 mg/dL 0.82  Sodium 135 - 145 mmol/L 137  Potassium 3.5 - 5.1 mmol/L 4.2  Chloride 101 - 111 mmol/L 107  CO2 22 - 32 mmol/L 23  Calcium 8.9 - 10.3 mg/dL 8.5(L)  Total Protein 6.5 - 8.1 g/dL 7.0  Total Bilirubin 0.3 - 1.2 mg/dL 0.4  Alkaline Phos 38 - 126 U/L 67  AST 15 - 41 U/L 18  ALT 14 - 54 U/L 12(L)    Discharge instruction: per After Visit Summary and "Baby and Me Booklet".  After Visit Meds:    Medication List    ASK your doctor about these medications        FLINSTONES GUMMIES OMEGA-3 DHA PO  Take 2 tablets by mouth daily.  Diet: routine diet  Activity: Advance as tolerated. Pelvic rest for 6 weeks.   Outpatient follow up:6 weeks Follow up Appt:Future Appointments Date Time Provider [place]  09/23/2015 10:15 AM Christin Fudge, CNM [facility]   Follow up visit: No Follow-up on file.  Postpartum contraception: Combination OCPs  Newborn Data: Live born female  Birth Weight: 7 lb 9.5 oz (3445 g) APGAR: 4, 7  Baby Feeding: Bottle Disposition:home with mother   09/21/2015 Koren Shiver, CNM

## 2015-09-21 NOTE — Progress Notes (Signed)
UR chart review completed.  

## 2015-09-22 ENCOUNTER — Encounter: Payer: Self-pay | Admitting: Family Medicine

## 2015-09-23 ENCOUNTER — Encounter: Payer: Self-pay | Admitting: Obstetrics & Gynecology

## 2015-09-23 ENCOUNTER — Encounter: Payer: Medicaid Other | Admitting: Advanced Practice Midwife

## 2015-09-23 DIAGNOSIS — O411290 Chorioamnionitis, unspecified trimester, not applicable or unspecified: Secondary | ICD-10-CM | POA: Insufficient documentation

## 2015-10-03 ENCOUNTER — Encounter: Payer: Self-pay | Admitting: Family Medicine

## 2015-11-02 ENCOUNTER — Ambulatory Visit (INDEPENDENT_AMBULATORY_CARE_PROVIDER_SITE_OTHER): Payer: Medicaid Other | Admitting: Women's Health

## 2015-11-02 ENCOUNTER — Encounter: Payer: Self-pay | Admitting: Women's Health

## 2015-11-02 DIAGNOSIS — Z3202 Encounter for pregnancy test, result negative: Secondary | ICD-10-CM | POA: Diagnosis not present

## 2015-11-02 LAB — POCT URINE PREGNANCY: PREG TEST UR: NEGATIVE

## 2015-11-02 MED ORDER — NORETHINDRONE 0.35 MG PO TABS: ORAL_TABLET | ORAL | 11 refills | Status: DC

## 2015-11-02 NOTE — Progress Notes (Signed)
Subjective:    Jody Hansen is a 20 y.o. G70P1001 Caucasian female who presents for a postpartum visit. She is 5 weeks postpartum following a spontaneous vaginal delivery at 39.4 gestational weeks. Anesthesia: epidural. I have fully reviewed the prenatal and intrapartum course. Postpartum course has been uncomplicated. Baby's course has been uncomplicated. Baby is feeding by breast & bottle. Bleeding no bleeding. Bowel function is normal. Bladder function is normal. Patient is sexually active. Last sexual activity: ~1wk ago- w/drawal method. Contraception method is coitus interruptus and wants pills. Discussed d/t breastfeeding would need micronor and has to take at exact same time daily- discussed all other progestin-only methods and she only wants pills.  Postpartum depression screening: negative. Score 2.  Last pap never <21yo.  The following portions of the patient's history were reviewed and updated as appropriate: allergies, current medications, past medical history, past surgical history and problem list.  Review of Systems Pertinent items are noted in HPI.   Vitals:   11/02/15 1415  BP: 116/82  Pulse: 72  Weight: 186 lb (84.4 kg)   Patient's last menstrual period was 12/16/2014 (exact date).  Objective:   General:  alert, cooperative and no distress   Breasts:  deferred, no complaints  Lungs: clear to auscultation bilaterally  Heart:  regular rate and rhythm  Abdomen: soft, nontender   Vulva: normal  Vagina: normal vagina  Cervix:  closed  Corpus: Well-involuted  Adnexa:  Non-palpable  Rectal Exam: No hemorrhoids        Assessment:   Postpartum exam 5 wks s/p SVB Breast & bottlefeeding Depression screening Contraception counseling   Plan:   Contraception: rx micronor w/ 11RF, take at exact same time daily- set alarm to help remember, condoms always for STI prevention Follow up in: 21yo for pap & physical or earlier if needed  Tawnya Crook CNM,  WHNP-BC 11/02/2015 2:20 PM

## 2015-11-02 NOTE — Patient Instructions (Signed)
You have to take at the exact same time every day- you can not even be more than 1 hour late or you can get pregnant. Set an alarm to help remind you  Norethindrone tablets (contraception) What is this medicine? NORETHINDRONE (nor eth IN drone) is an oral contraceptive. The product contains a female hormone known as a progestin. It is used to prevent pregnancy. This medicine may be used for other purposes; ask your health care provider or pharmacist if you have questions. What should I tell my health care provider before I take this medicine? They need to know if you have any of these conditions: -blood vessel disease or blood clots -breast, cervical, or vaginal cancer -diabetes -heart disease -kidney disease -liver disease -mental depression -migraine -seizures -stroke -vaginal bleeding -an unusual or allergic reaction to norethindrone, other medicines, foods, dyes, or preservatives -pregnant or trying to get pregnant -breast-feeding How should I use this medicine? Take this medicine by mouth with a glass of water. You may take it with or without food. Follow the directions on the prescription label. Take this medicine at the same time each day and in the order directed on the package. Do not take your medicine more often than directed. Contact your pediatrician regarding the use of this medicine in children. Special care may be needed. This medicine has been used in female children who have started having menstrual periods. A patient package insert for the product will be given with each prescription and refill. Read this sheet carefully each time. The sheet may change frequently. Overdosage: If you think you have taken too much of this medicine contact a poison control center or emergency room at once. NOTE: This medicine is only for you. Do not share this medicine with others. What if I miss a dose? Try not to miss a dose. Every time you miss a dose or take a dose late your chance of  pregnancy increases. When 1 pill is missed (even if only 3 hours late), take the missed pill as soon as possible and continue taking a pill each day at the regular time (use a back up method of birth control for the next 48 hours). If more than 1 dose is missed, use an additional birth control method for the rest of your pill pack until menses occurs. Contact your health care professional if more than 1 dose has been missed. What may interact with this medicine? Do not take this medicine with any of the following medications: -amprenavir or fosamprenavir -bosentan This medicine may also interact with the following medications: -antibiotics or medicines for infections, especially rifampin, rifabutin, rifapentine, and griseofulvin, and possibly penicillins or tetracyclines -aprepitant -barbiturate medicines, such as phenobarbital -carbamazepine -felbamate -modafinil -oxcarbazepine -phenytoin -ritonavir or other medicines for HIV infection or AIDS -St. John's wort -topiramate This list may not describe all possible interactions. Give your health care provider a list of all the medicines, herbs, non-prescription drugs, or dietary supplements you use. Also tell them if you smoke, drink alcohol, or use illegal drugs. Some items may interact with your medicine. What should I watch for while using this medicine? Visit your doctor or health care professional for regular checks on your progress. You will need a regular breast and pelvic exam and Pap smear while on this medicine. Use an additional method of birth control during the first cycle that you take these tablets. If you have any reason to think you are pregnant, stop taking this medicine right away and contact your  doctor or health care professional. If you are taking this medicine for hormone related problems, it may take several cycles of use to see improvement in your condition. This medicine does not protect you against HIV infection (AIDS)  or any other sexually transmitted diseases. What side effects may I notice from receiving this medicine? Side effects that you should report to your doctor or health care professional as soon as possible: -breast tenderness or discharge -pain in the abdomen, chest, groin or leg -severe headache -skin rash, itching, or hives -sudden shortness of breath -unusually weak or tired -vision or speech problems -yellowing of skin or eyes Side effects that usually do not require medical attention (report to your doctor or health care professional if they continue or are bothersome): -changes in sexual desire -change in menstrual flow -facial hair growth -fluid retention and swelling -headache -irritability -nausea -weight gain or loss This list may not describe all possible side effects. Call your doctor for medical advice about side effects. You may report side effects to FDA at 1-800-FDA-1088. Where should I keep my medicine? Keep out of the reach of children. Store at room temperature between 15 and 30 degrees C (59 and 86 degrees F). Throw away any unused medicine after the expiration date. NOTE: This sheet is a summary. It may not cover all possible information. If you have questions about this medicine, talk to your doctor, pharmacist, or health care provider.    2016, Elsevier/Gold Standard. (2011-11-11 16:41:35)

## 2015-11-23 ENCOUNTER — Other Ambulatory Visit: Payer: Self-pay | Admitting: Women's Health

## 2015-11-23 ENCOUNTER — Encounter: Payer: Self-pay | Admitting: Women's Health

## 2015-11-23 MED ORDER — LEVONORGEST-ETH ESTRAD 91-DAY 0.15-0.03 MG PO TABS: 1.0000 | ORAL_TABLET | Freq: Every day | ORAL | 4 refills | Status: DC

## 2015-11-23 NOTE — Progress Notes (Signed)
Pt sent note via my chart, finished breastfeeding, wants rx for coc's where she only has period q 64mths. Does not smoke, no h/o HTN, DVT/PE, CVA, MI, or migraines w/ aura. Rx for seasonale sent. Pt to call office and make coc f/u for 51mths.  Roma Schanz, CNM, [facility] 11/23/2015 3:13 PM

## 2016-01-10 ENCOUNTER — Encounter: Payer: Self-pay | Admitting: Family Medicine

## 2016-01-11 NOTE — Telephone Encounter (Signed)
This is too difficult to answer, I recommend that the patient call and talk with one of the nurses-may need an office visit at some point

## 2016-03-29 ENCOUNTER — Ambulatory Visit (INDEPENDENT_AMBULATORY_CARE_PROVIDER_SITE_OTHER): Payer: Medicaid Other | Admitting: Family Medicine

## 2016-03-29 ENCOUNTER — Encounter: Payer: Self-pay | Admitting: Family Medicine

## 2016-03-29 VITALS — Temp 98.1°F

## 2016-03-29 DIAGNOSIS — R110 Nausea: Secondary | ICD-10-CM | POA: Diagnosis not present

## 2016-03-29 DIAGNOSIS — R1084 Generalized abdominal pain: Secondary | ICD-10-CM

## 2016-03-29 LAB — POCT URINE PREGNANCY: PREG TEST UR: NEGATIVE

## 2016-03-29 MED ORDER — DICYCLOMINE HCL 10 MG PO CAPS: ORAL_CAPSULE | ORAL | 0 refills | Status: DC

## 2016-03-29 MED ORDER — CEFPROZIL 250 MG/5ML PO SUSR: ORAL | 0 refills | Status: DC

## 2016-03-29 MED ORDER — ONDANSETRON 8 MG PO TBDP: 8.0000 mg | ORAL_TABLET | Freq: Three times a day (TID) | ORAL | 0 refills | Status: DC | PRN

## 2016-03-29 NOTE — Progress Notes (Signed)
   Subjective:    Patient ID: Jody Hansen, female    DOB: 12/18/1995, 21 y.o.   MRN: [id number]  HPI  Patient arrives with c/o nausea and abdominal pain that just started This patient had significant lower abdominal cramping nausea and midabdominal cramping that had onset literally less an hour before she presented. She brought her child here for a well-child checkup on while she was waiting in the waiting area she had onset of feeling hot and cold sweaty then intermittent abdominal cramps and nausea she denies being pregnant. She denies fevers chills denies vomiting or diarrhea or bloody stools. Review of Systems Please see above. No cough wheezing difficulty breathing runny nose or sore throat    Objective:   Physical Exam Neck no masses mucous membranes are moist eardrums are normal lungs are clear no crackles heart is regular abdomen is soft no guarding or rebound subjected discomfort throughout the abdomen no specific point tenderness   The patient was warned that we are very early into the symptoms of this illness and that if he gets worse it may show itself to be something more significant than just an intestinal virus. Therefore may end up needing to have additional testing here or ER    Assessment & Plan:  Significant nausea related to probable virus possible early flu versus viral gastroenteritis  I do not feel the patient has appendicitis but she was educated that if her symptoms worsen she needs to let us know she is to give [place] update of her symptoms in the morning.

## 2016-03-29 NOTE — Patient Instructions (Signed)
If your pain gets worse you will need to be rechecked  Please call us Weds a.m. To give the nurse an update on how you are doing ( we may need to rechck you or run test if getting worse)      Viral Gastroenteritis, Adult Introduction Viral gastroenteritis is also known as the stomach flu. This condition is caused by certain germs (viruses). These germs can be passed from person to person very easily (are very contagious). This condition can cause sudden watery poop (diarrhea), fever, and throwing up (vomiting). Having watery poop and throwing up can make you feel weak and cause you to get dehydrated. Dehydration can make you tired and thirsty, make you have a dry mouth, and make it so you pee (urinate) less often. Older adults and people with other diseases or a weak defense system (immune system) are at higher risk for dehydration. It is important to replace the fluids that you lose from having watery poop and throwing up. Follow these instructions at home: Follow instructions from your doctor about how to care for yourself at home. Eating and drinking Follow these instructions as told by your doctor:  Take an oral rehydration solution (ORS). This is a drink that is sold at pharmacies and stores.  Drink clear fluids in small amounts as you are able, such as:  Water.  Ice chips.  Diluted fruit juice.  Low-calorie sports drinks.  Eat bland, easy-to-digest foods in small amounts as you are able, such as:  Bananas.  Applesauce.  Rice.  Low-fat (lean) meats.  Toast.  Crackers.  Avoid fluids that have a lot of sugar or caffeine in them.  Avoid alcohol.  Avoid spicy or fatty foods. General instructions  Drink enough fluid to keep your pee (urine) clear or pale yellow.  Wash your hands often. If you cannot use soap and water, use hand sanitizer.  Make sure that all people in your home wash their hands well and often.  Rest at home while you get better.  Take  over-the-counter and prescription medicines only as told by your doctor.  Watch your condition for any changes.  Take a warm bath to help with any burning or pain from having watery poop.  Keep all follow-up visits as told by your doctor. This is important. Contact a doctor if:  You cannot keep fluids down.  Your symptoms get worse.  You have new symptoms.  You feel light-headed or dizzy.  You have muscle cramps. Get help right away if:  You have chest pain.  You feel very weak or you pass out (faint).  You see blood in your throw-up.  Your throw-up looks like coffee grounds.  You have bloody or black poop (stools) or poop that look like tar.  You have a very bad headache, a stiff neck, or both.  You have a rash.  You have very bad pain, cramping, or bloating in your belly (abdomen).  You have trouble breathing.  You are breathing very quickly.  Your heart is beating very quickly.  Your skin feels cold and clammy.  You feel confused.  You have pain when you pee.  You have signs of dehydration, such as:  Dark pee, hardly any pee, or no pee.  Cracked lips.  Dry mouth.  Sunken eyes.  Sleepiness.  Weakness. This information is not intended to replace advice given to you by your health care provider. Make sure you discuss any questions you have with your health care provider. Document  Released: 08/10/2007 Document Revised: 09/11/2015 Document Reviewed: 10/28/2014  2017 Elsevier

## 2016-04-01 ENCOUNTER — Telehealth: Payer: Self-pay | Admitting: Family Medicine

## 2016-04-01 NOTE — Telephone Encounter (Signed)
Discussed with pt. Pt verbalized understanding.  °

## 2016-04-01 NOTE — Telephone Encounter (Signed)
Patient was seen on 03/29/16 by Dr. Nicki Reaper.  She said she was supposed to call back and let us know how she is doing.  She says she feels better, but when she goes to wash, she started burning with urination and the skin turned red.  Please advise.

## 2016-04-01 NOTE — Telephone Encounter (Signed)
Most likely external irritation causing dysuria. However, please assess for urgency, frequency, back or flank pain. Fever at this point. Thanks.

## 2016-04-01 NOTE — Telephone Encounter (Signed)
No fever, no flank pain, no back pain, no urgency.

## 2016-04-01 NOTE — Telephone Encounter (Signed)
Recommend OTC barrier cream such as Desitin (diaper rash cream); most likely her recent illness caused skin to be very irritated. Call back Monday if no improvement or sooner if other symptoms develop.

## 2016-04-01 NOTE — Telephone Encounter (Signed)
Patient started with nausea and vomiting on Tuesday while in office with daughter- Urine pregnancy negative and dx with gastroenteritis

## 2016-05-26 ENCOUNTER — Other Ambulatory Visit: Payer: Self-pay | Admitting: Nurse Practitioner

## 2016-05-26 ENCOUNTER — Encounter: Payer: Self-pay | Admitting: Family Medicine

## 2016-06-02 ENCOUNTER — Encounter: Payer: Self-pay | Admitting: Women's Health

## 2016-06-02 ENCOUNTER — Ambulatory Visit (INDEPENDENT_AMBULATORY_CARE_PROVIDER_SITE_OTHER): Payer: Medicaid Other | Admitting: Women's Health

## 2016-06-02 VITALS — BP 110/70 | HR 76 | Ht 62.0 in | Wt 180.4 lb

## 2016-06-02 DIAGNOSIS — Z3202 Encounter for pregnancy test, result negative: Secondary | ICD-10-CM | POA: Diagnosis not present

## 2016-06-02 DIAGNOSIS — N926 Irregular menstruation, unspecified: Secondary | ICD-10-CM

## 2016-06-02 DIAGNOSIS — G43109 Migraine with aura, not intractable, without status migrainosus: Secondary | ICD-10-CM | POA: Insufficient documentation

## 2016-06-02 LAB — POCT WET PREP (WET MOUNT)
Clue Cells Wet Prep Whiff POC: NEGATIVE
Trichomonas Wet Prep HPF POC: ABSENT

## 2016-06-02 LAB — POCT URINE PREGNANCY: PREG TEST UR: NEGATIVE

## 2016-06-02 NOTE — Progress Notes (Signed)
   [facility] Visit  Patient name: Jody Hansen MRN [id number]  Date of birth: [birth year]  CC & HPI:  Jody Hansen is a 21 y.o. G42P1001 Caucasian female presenting today for report of period x 2wks that started 2/26, then period again 3/20. On micronor, but no longer breastfeeding. White d/c that turns to crust, no itching/irritation. No change in sex partners. Does have migraines and sees spots. Discussed all progestin-only methods. Wants nexplanon.  Patient's last menstrual period was 05/24/2016. The current method of family planning is oral progesterone-only contraceptive. Last pap <21yo  Pertinent History Reviewed:  Medical & Surgical Hx:   Past medical, surgical, family, and social history reviewed in electronic medical record Medications: Reviewed & Updated - see associated section Allergies: Reviewed in electronic medical record  Objective Findings:  Vitals: BP 110/70   Pulse 76   Ht 5\' 2"  (1.575 m)   Wt 180 lb 6.4 oz (81.8 kg)   LMP 05/24/2016   BMI 33.00 kg/m  Body mass index is 33 kg/m.  Physical Examination: General appearance - alert, well appearing, and in no distress Pelvic - cx very anterior, mod amt white nonodorous d/c  Results for orders placed or performed in visit on 06/02/16 (from the past 24 hour(s))  POCT urine pregnancy   Collection Time: 06/02/16  2:28 PM  Result Value Ref Range   Preg Test, Ur Negative Negative  POCT Wet Prep [facility] [place])   Collection Time: 06/02/16  2:46 PM  Result Value Ref Range   Source Wet Prep POC vaginal    WBC, Wet Prep HPF POC few    Bacteria Wet Prep HPF POC None (A) Few   BACTERIA WET PREP MORPHOLOGY POC     Clue Cells Wet Prep HPF POC None None   Clue Cells Wet Prep Whiff POC Negative Whiff    Yeast Wet Prep HPF POC None    KOH Wet Prep POC     Trichomonas Wet Prep HPF POC Absent Absent     Assessment & Plan:  A:   Irregular periods w/ micronor  P:  GC/CT sent  No sex x 14d before  nexplanon insertion- will do UPT at time of insertion  Return in about 3 weeks (around 06/23/2016) for Nexplanon insertion, order today please.  Tawnya Crook CNM, [facility] 06/02/2016 2:52 PM

## 2016-06-02 NOTE — Patient Instructions (Signed)
No sex after April 4th Etonogestrel implant What is this medicine? ETONOGESTREL (et oh noe JES trel) is a contraceptive (birth control) device. It is used to prevent pregnancy. It can be used for up to 3 years. This medicine may be used for other purposes; ask your health care provider or pharmacist if you have questions. COMMON BRAND NAME(S): Implanon, Nexplanon What should I tell my health care provider before I take this medicine? They need to know if you have any of these conditions: -abnormal vaginal bleeding -blood vessel disease or blood clots -cancer of the breast, cervix, or liver -depression -diabetes -gallbladder disease -headaches -heart disease or recent heart attack -high blood pressure -high cholesterol -kidney disease -liver disease -renal disease -seizures -tobacco smoker -an unusual or allergic reaction to etonogestrel, other hormones, anesthetics or antiseptics, medicines, foods, dyes, or preservatives -pregnant or trying to get pregnant -breast-feeding How should I use this medicine? This device is inserted just under the skin on the inner side of your upper arm by a health care professional. Talk to your pediatrician regarding the use of this medicine in children. Special care may be needed. Overdosage: If you think you have taken too much of this medicine contact a poison control center or emergency room at once. NOTE: This medicine is only for you. Do not share this medicine with others. What if I miss a dose? This does not apply. What may interact with this medicine? Do not take this medicine with any of the following medications: -amprenavir -bosentan -fosamprenavir This medicine may also interact with the following medications: -barbiturate medicines for inducing sleep or treating seizures -certain medicines for fungal infections like ketoconazole and itraconazole -grapefruit juice -griseofulvin -medicines to treat seizures like carbamazepine,  felbamate, oxcarbazepine, phenytoin, topiramate -modafinil -phenylbutazone -rifampin -rufinamide -some medicines to treat HIV infection like atazanavir, indinavir, lopinavir, nelfinavir, tipranavir, ritonavir -St. John's wort This list may not describe all possible interactions. Give your health care provider a list of all the medicines, herbs, non-prescription drugs, or dietary supplements you use. Also tell them if you smoke, drink alcohol, or use illegal drugs. Some items may interact with your medicine. What should I watch for while using this medicine? This product does not protect you against HIV infection (AIDS) or other sexually transmitted diseases. You should be able to feel the implant by pressing your fingertips over the skin where it was inserted. Contact your doctor if you cannot feel the implant, and use a non-hormonal birth control method (such as condoms) until your doctor confirms that the implant is in place. If you feel that the implant may have broken or become bent while in your arm, contact your healthcare provider. What side effects may I notice from receiving this medicine? Side effects that you should report to your doctor or health care professional as soon as possible: -allergic reactions like skin rash, itching or hives, swelling of the face, lips, or tongue -breast lumps -changes in emotions or moods -depressed mood -heavy or prolonged menstrual bleeding -pain, irritation, swelling, or bruising at the insertion site -scar at site of insertion -signs of infection at the insertion site such as fever, and skin redness, pain or discharge -signs of pregnancy -signs and symptoms of a blood clot such as breathing problems; changes in vision; chest pain; severe, sudden headache; pain, swelling, warmth in the leg; trouble speaking; sudden numbness or weakness of the face, arm or leg -signs and symptoms of liver injury like dark yellow or brown urine; general ill feeling  or  flu-like symptoms; light-colored stools; loss of appetite; nausea; right upper belly pain; unusually weak or tired; yellowing of the eyes or skin -unusual vaginal bleeding, discharge -signs and symptoms of a stroke like changes in vision; confusion; trouble speaking or understanding; severe headaches; sudden numbness or weakness of the face, arm or leg; trouble walking; dizziness; loss of balance or coordination Side effects that usually do not require medical attention (report to your doctor or health care professional if they continue or are bothersome): -acne -back pain -breast pain -changes in weight -dizziness -general ill feeling or flu-like symptoms -headache -irregular menstrual bleeding -nausea -sore throat -vaginal irritation or inflammation This list may not describe all possible side effects. Call your doctor for medical advice about side effects. You may report side effects to FDA at 1-800-FDA-1088. Where should I keep my medicine? This drug is given in a hospital or clinic and will not be stored at home. NOTE: This sheet is a summary. It may not cover all possible information. If you have questions about this medicine, talk to your doctor, pharmacist, or health care provider.  2018 Elsevier/Gold Standard (2015-09-10 11:19:22)

## 2016-06-04 LAB — GC/CHLAMYDIA PROBE AMP
Chlamydia trachomatis, NAA: NEGATIVE
Neisseria gonorrhoeae by PCR: NEGATIVE

## 2016-06-23 ENCOUNTER — Encounter: Payer: Medicaid Other | Admitting: Women's Health

## 2016-07-04 ENCOUNTER — Encounter: Payer: Medicaid Other | Admitting: Women's Health

## 2016-07-08 ENCOUNTER — Encounter: Payer: Self-pay | Admitting: Women's Health

## 2016-07-08 ENCOUNTER — Ambulatory Visit (INDEPENDENT_AMBULATORY_CARE_PROVIDER_SITE_OTHER): Payer: Medicaid Other | Admitting: Women's Health

## 2016-07-08 VITALS — BP 106/80 | HR 82 | Ht 62.0 in | Wt 183.0 lb

## 2016-07-08 DIAGNOSIS — Z3049 Encounter for surveillance of other contraceptives: Secondary | ICD-10-CM | POA: Diagnosis not present

## 2016-07-08 DIAGNOSIS — Z3202 Encounter for pregnancy test, result negative: Secondary | ICD-10-CM

## 2016-07-08 DIAGNOSIS — Z30017 Encounter for initial prescription of implantable subdermal contraceptive: Secondary | ICD-10-CM

## 2016-07-08 LAB — POCT URINE PREGNANCY: PREG TEST UR: NEGATIVE

## 2016-07-08 NOTE — Progress Notes (Signed)
Jody Hansen is a 21 y.o. year old Hispanic female here for Nexplanon insertion.  Patient's last menstrual period was 06/29/2016., last sexual intercourse was >2wks ago, and her pregnancy test today was negative.  Risks/benefits/side effects of Nexplanon have been discussed and her questions have been answered.  Specifically, a failure rate of 03/998 has been reported, with an increased failure rate if pt takes [place] and/or antiseizure medicaitons.  Jody Hansen is aware of the common side effect of irregular bleeding, which the incidence of decreases over time.  BP 106/80 (BP Location: Left Arm, Patient Position: Sitting, Cuff Size: Normal)   Pulse 82   Ht 5\' 2"  (1.575 m)   Wt 183 lb (83 kg)   LMP 06/29/2016   Breastfeeding? No   BMI 33.47 kg/m   Results for orders placed or performed in visit on 07/08/16 (from the past 24 hour(s))  POCT urine pregnancy   Collection Time: 07/08/16 12:07 PM  Result Value Ref Range   Preg Test, Ur Negative Negative     She is right-handed, so her left arm, approximately 4 inches proximal from the elbow, was cleansed with alcohol and anesthetized with 2cc of 2% Lidocaine.  The area was cleansed again with betadine and the Nexplanon was inserted per manufacturer's recommendations without difficulty.  3 steri-strips and pressure bandage were applied.  Pt was instructed to keep the area clean and dry, remove pressure bandage in 24 hours, and keep insertion site covered with the steri-strip for 3-5 days.  Back up contraception was recommended for 2 weeks.  She was given a card indicating date Nexplanon was inserted and date it needs to be removed. Follow-up PRN problems, then after turns 21yo for pap.   Tawnya Crook CNM, [facility] 07/08/2016 12:13 PM

## 2016-07-08 NOTE — Patient Instructions (Signed)

## 2016-08-11 ENCOUNTER — Other Ambulatory Visit: Payer: Medicaid Other | Admitting: Women's Health

## 2016-08-25 ENCOUNTER — Other Ambulatory Visit: Payer: Medicaid Other | Admitting: Women's Health

## 2016-08-25 ENCOUNTER — Telehealth: Payer: Self-pay | Admitting: *Deleted

## 2016-08-25 NOTE — Telephone Encounter (Signed)
Spoke with pt. Pt states she has trouble having a bowel movement and bleeds some with BM's. I advised to try OTC Colace. Also, drink plenty of water. Can also drink prune juice or eat prunes. If no improvement, let us know. Pt voiced understanding. [place]

## 2016-09-05 ENCOUNTER — Other Ambulatory Visit: Payer: Medicaid Other | Admitting: Women's Health

## 2017-06-13 ENCOUNTER — Other Ambulatory Visit (HOSPITAL_COMMUNITY)
Admission: RE | Admit: 2017-06-13 | Discharge: 2017-06-13 | Disposition: A | Discharge status: Home / Self Care | Payer: Medicaid Other | Source: Ambulatory Visit | Attending: Advanced Practice Midwife | Admitting: Advanced Practice Midwife

## 2017-06-13 ENCOUNTER — Encounter: Payer: Self-pay | Admitting: Advanced Practice Midwife

## 2017-06-13 ENCOUNTER — Ambulatory Visit (INDEPENDENT_AMBULATORY_CARE_PROVIDER_SITE_OTHER): Payer: Medicaid Other | Admitting: Advanced Practice Midwife

## 2017-06-13 VITALS — BP 100/60 | HR 87 | Ht 62.5 in | Wt 189.2 lb

## 2017-06-13 DIAGNOSIS — Z01419 Encounter for gynecological examination (general) (routine) without abnormal findings: Secondary | ICD-10-CM

## 2017-06-13 DIAGNOSIS — Z3009 Encounter for other general counseling and advice on contraception: Secondary | ICD-10-CM | POA: Diagnosis not present

## 2017-06-13 DIAGNOSIS — Z309 Encounter for contraceptive management, unspecified: Secondary | ICD-10-CM | POA: Diagnosis not present

## 2017-06-13 MED ORDER — MISOPROSTOL 200 MCG PO TABS: ORAL_TABLET | ORAL | 0 refills | Status: DC

## 2017-06-13 NOTE — Progress Notes (Signed)
Jody Hansen 21 y.o.  Vitals:   06/13/17 1338  BP: 100/60  Pulse: 87     Filed Weights   06/13/17 1338  Weight: 189 lb 3.2 oz (85.8 kg)    Past Medical History: Past Medical History:  Diagnosis Date  . Kidney stones 2017  . Pregnant 01/23/2015  . Scoliosis     Past Surgical History: Past Surgical History:  Procedure Laterality Date  . NO PAST SURGERIES      Family History: Family History  Problem Relation Age of Onset  . Diabetes Paternal Grandfather   . Diabetes Paternal Grandmother   . Cancer Maternal Grandmother   . Cancer Maternal Grandfather   . Diabetes Father   . Diabetes Mother   . Diabetes Sister        borderline    Social History: Social History   Tobacco Use  . Smoking status: Never Smoker  . Smokeless tobacco: Never Used  Substance Use Topics  . Alcohol use: No  . Drug use: No    Allergies:  Allergies  Allergen Reactions  . Depo-Provera [Medroxyprogesterone Acetate] Other (See Comments)    Weight gain; bruising; headaches      Current Outpatient Medications:  .  etonogestrel (NEXPLANON) 68 MG IMPL implant, 1 each by Subdermal route once., Disp: , Rfl:   History of Present Illness: Here for pap.  Has Nexplanon.  never bleeds. Feels like she has a discharge and she doesn't like it. Wants to be off hormones.  Can't lose weight. May want Paragard.  Going to beach in 3 weeks, doesn't want to bleed.   Review of Systems   Patient denies any headaches, blurred vision, shortness of breath, chest pain, abdominal pain, problems with bowel movements, urination.  Sometimes vagina is dry during sex.   Physical Exam: General:  Well developed, well nourished, no acute distress Skin:  Warm and dry Neck:  Midline trachea, normal thyroid Lungs; Clear to auscultation bilaterally Breast:  No dominant palpable mass, retraction, or nipple discharge Cardiovascular: Regular rate and rhythm Abdomen:  Soft, non tender, no  hepatosplenomegaly Pelvic:  External genitalia is normal in appearance.  The vagina is normal in appearance.  The cervix is bulbous.  Uterus is felt to be normal size, shape, and contour.  No adnexal masses or tenderness noted.  Extremities:  No swelling or varicosities noted Psych:  No mood changes.     Impression: normal GYN exam Contraception discussion     Plan: make appt to get Nexplanon removed.Get Paragard  put in first and premedicate w/cytotec.

## 2017-06-15 LAB — CYTOLOGY - PAP
CHLAMYDIA, DNA PROBE: NEGATIVE
Diagnosis: NEGATIVE
NEISSERIA GONORRHEA: NEGATIVE

## 2017-07-11 ENCOUNTER — Ambulatory Visit (INDEPENDENT_AMBULATORY_CARE_PROVIDER_SITE_OTHER): Payer: Medicaid Other | Admitting: Advanced Practice Midwife

## 2017-07-11 ENCOUNTER — Encounter: Payer: Self-pay | Admitting: Advanced Practice Midwife

## 2017-07-11 VITALS — BP 114/80 | HR 95 | Ht 62.2 in | Wt 191.2 lb

## 2017-07-11 DIAGNOSIS — Z30430 Encounter for insertion of intrauterine contraceptive device: Secondary | ICD-10-CM

## 2017-07-11 DIAGNOSIS — Z30432 Encounter for removal of intrauterine contraceptive device: Secondary | ICD-10-CM | POA: Insufficient documentation

## 2017-07-11 DIAGNOSIS — Z3202 Encounter for pregnancy test, result negative: Secondary | ICD-10-CM | POA: Diagnosis not present

## 2017-07-11 MED ORDER — PARAGARD INTRAUTERINE COPPER IU IUD
1.0000 | INTRAUTERINE_SYSTEM | Freq: Once | INTRAUTERINE | Status: AC
  Administered 2017-07-11: 1 via INTRAUTERINE

## 2017-07-11 NOTE — Addendum Note (Signed)
Addended by: Gaylyn Rong A on: 07/11/2017 03:07 PM   Modules accepted: Orders

## 2017-07-11 NOTE — Progress Notes (Signed)
Jody Hansen is a 22 y.o. year old  female   who presents for placement of a paragard IUD.She still has a NExplanon in and her pregnancy test today is negative.    The risks and benefits of the method and placement have been thouroughly reviewed with the patient and all questions were answered.  Specifically the patient is aware of failure rate of 03/998, expulsion of the IUD and of possible perforation.  The patient is aware of irregular bleeding due to the method and understands the incidence of irregular bleeding diminishes with time.  Time out was performed.  A Graves speculum was placed.  The cervix was prepped using Betadine. The uterus was found to be retroflexed and it sounded to 7 cm.  The cervix was grasped with a tenaculum and the IUD was inserted to 7 cm.  It was pulled back 1 cm and the IUD was disengaged.  The strings were trimmed to 3 cm.  Sonogram was performed and the proper placement of the IUD was verified.  The patient was instructed on signs and symptoms of infection and to check for the strings after each menses or each month.  The patient is to refrain from intercourse for 3 days.  The patient is scheduled for a return appointment after her first menses or 4 weeks.   Will remove nexplanon at that visit.  Christin Fudge 07/11/2017 2:31 PM

## 2017-07-12 ENCOUNTER — Ambulatory Visit (INDEPENDENT_AMBULATORY_CARE_PROVIDER_SITE_OTHER): Payer: Medicaid Other | Admitting: Advanced Practice Midwife

## 2017-07-12 ENCOUNTER — Encounter: Payer: Self-pay | Admitting: Advanced Practice Midwife

## 2017-07-12 VITALS — BP 100/66 | HR 114 | Ht 62.5 in | Wt 191.0 lb

## 2017-07-12 DIAGNOSIS — Z3049 Encounter for surveillance of other contraceptives: Secondary | ICD-10-CM

## 2017-07-12 DIAGNOSIS — Z3046 Encounter for surveillance of implantable subdermal contraceptive: Secondary | ICD-10-CM

## 2017-07-12 NOTE — Progress Notes (Addendum)
HPI:  Jody Hansen 22 y.o. here for Nexplanon removal.  Her future plans for birth control are Paragard inserted yesterday. Doing fine.  Past Medical History: Past Medical History:  Diagnosis Date  . Kidney stones 2017  . Pregnant 01/23/2015  . Scoliosis     Past Surgical History: Past Surgical History:  Procedure Laterality Date  . NO PAST SURGERIES      Family History: Family History  Problem Relation Age of Onset  . Diabetes Paternal Grandfather   . Diabetes Paternal Grandmother   . Cancer Maternal Grandmother   . Cancer Maternal Grandfather   . Diabetes Father   . Diabetes Mother   . Diabetes Sister        borderline    Social History: Social History   Tobacco Use  . Smoking status: Never Smoker  . Smokeless tobacco: Never Used  Substance Use Topics  . Alcohol use: No  . Drug use: No    Allergies:  Allergies  Allergen Reactions  . Depo-Provera [Medroxyprogesterone Acetate] Other (See Comments)    Weight gain; bruising; headaches    Meds:  (Not in a hospital admission)    Patient given informed consent for removal of her Nexplanon, time out was performed.  Signed copy in the chart.  Appropriate time out taken. Implanon site identified.  Area prepped in usual sterile fashon. One cc of 1% lidocaine was used to anesthetize the area at the distal end of the implant. A small stab incision was made right beside the implant on the distal portion.  The Nexplanon rod was grasped using hemostats and removed without difficulty.  There was less than 3 cc blood loss. There were no complications.  A small amount of antibiotic ointment and steri-strips were applied over the small incision.  A pressure bandage was applied to reduce any bruising.  The patient tolerated the procedure well and was given post procedure instructions.

## 2017-08-09 ENCOUNTER — Ambulatory Visit: Payer: Medicaid Other | Admitting: Advanced Practice Midwife

## 2017-08-09 ENCOUNTER — Encounter: Payer: Self-pay | Admitting: *Deleted

## 2017-08-24 ENCOUNTER — Encounter: Payer: Self-pay | Admitting: *Deleted

## 2017-08-24 ENCOUNTER — Ambulatory Visit: Payer: Medicaid Other | Admitting: Obstetrics and Gynecology

## 2017-08-31 ENCOUNTER — Ambulatory Visit: Payer: Medicaid Other | Admitting: Adult Health

## 2017-09-01 ENCOUNTER — Encounter: Payer: Self-pay | Admitting: Adult Health

## 2017-09-01 ENCOUNTER — Ambulatory Visit (INDEPENDENT_AMBULATORY_CARE_PROVIDER_SITE_OTHER): Payer: Medicaid Other | Admitting: Adult Health

## 2017-09-01 VITALS — BP 91/60 | HR 71 | Ht 62.0 in | Wt 194.4 lb

## 2017-09-01 DIAGNOSIS — Z30431 Encounter for routine checking of intrauterine contraceptive device: Secondary | ICD-10-CM | POA: Diagnosis not present

## 2017-09-01 NOTE — Progress Notes (Signed)
  Subjective:     Patient ID: Jody Hansen, female   DOB: [birth year], 22 y.o.   MRN: [id number]  HPI Jody Hansen is a 22 year old white female, I for IUD check,she had Paragard inserted 07/11/17 and nexplanon removed 07/12/17.She says she wants a period.   Review of Systems Has had period, no cramps Good vaginal moisture Reviewed past medical,surgical, social and family history. Reviewed medications and allergies.     Objective:   Physical Exam BP 91/60 (BP Location: Left Arm, Patient Position: Sitting, Cuff Size: Large)   Pulse 71   Ht 5\' 2"  (1.575 m)   Wt 194 lb 6.4 oz (88.2 kg)   LMP 08/19/2017   BMI 35.56 kg/m   Skin warm and dry.Pelvic: external genitalia is normal in appearance no lesions, vagina: pink with good moisture,urethra has no lesions or masses noted, cervix:smooth,+IUD strings, uterus: normal size, shape and contour, non tender, no masses felt, adnexa: no masses or tenderness noted. Bladder is non tender and no masses felt.     Assessment:     1. IUD check up       Plan:     F/U prn

## 2018-04-03 ENCOUNTER — Encounter: Payer: Self-pay | Admitting: Family Medicine

## 2018-04-03 ENCOUNTER — Ambulatory Visit (INDEPENDENT_AMBULATORY_CARE_PROVIDER_SITE_OTHER): Payer: BLUE CROSS/BLUE SHIELD | Admitting: Family Medicine

## 2018-04-03 VITALS — BP 114/82 | Wt 197.2 lb

## 2018-04-03 DIAGNOSIS — R5383 Other fatigue: Secondary | ICD-10-CM

## 2018-04-03 NOTE — Progress Notes (Signed)
   Subjective:    Patient ID: Jody Hansen, female    DOB: 1995/12/02, 23 y.o.   MRN: [id number]  HPI Pt here today due to dentist informing her that she has some salvia deposits under tongue and in back of throat. Dentist stated that she would benefit from getting checked out by provider. Pt states she does hyperventilate in sleep and fiance states she does quit breathing at times. Pt does snore. States she starts "panicking" in her sleep and fiance has to wake her up out of it - states happens 2-3 times per month. Sometimes in relation to nightmares. Reports feeling fatigued all day, never wakes feeling rested.   Reports random sharp pains to left side of chest 1-2 times per month, lasting only a few minutes. Does not appear to be r/t anything that she can tell. No shortness of breath or N/V.   Review of Systems  Constitutional: Positive for fatigue. Negative for unexpected weight change.  Respiratory: Negative for cough, shortness of breath and wheezing.   Psychiatric/Behavioral: Positive for sleep disturbance.       Objective:   Physical Exam Vitals signs and nursing note reviewed.  Constitutional:      General: She is not in acute distress.    Appearance: She is well-developed.  HENT:     Head: Normocephalic and atraumatic.     Mouth/Throat:     Mouth: Mucous membranes are moist.     Pharynx: Oropharynx is clear.  Neck:     Musculoskeletal: Neck supple. No neck rigidity.  Cardiovascular:     Rate and Rhythm: Normal rate and regular rhythm.     Heart sounds: Normal heart sounds. No murmur.  Pulmonary:     Effort: Pulmonary effort is normal. No respiratory distress.     Breath sounds: Normal breath sounds.  Lymphadenopathy:     Cervical: No cervical adenopathy.  Skin:    General: Skin is warm and dry.  Neurological:     Mental Status: She is alert and oriented to person, place, and time.           Assessment & Plan:  Fatigue, unspecified type - Plan: CBC with  Differential, TSH, Glucose, Ambulatory referral to Sleep Studies  [place] questionnaire positive, pt with positive family hx of OSA, will refer for sleep study. Will also obtain some basic lab work to rule out other potential causes of her daytime sleepiness, will notify of results. Discussed with pt her intermittent sharp chest pain is unlikely to r/t cardiac problems, if this continues or becomes worse she should schedule a separate f/u visit.   Dr. Sallee Lange was consulted on this case and is in agreement with the above treatment plan.

## 2018-04-04 ENCOUNTER — Other Ambulatory Visit: Payer: Self-pay | Admitting: Family Medicine

## 2018-04-04 DIAGNOSIS — R5383 Other fatigue: Secondary | ICD-10-CM

## 2018-04-04 LAB — CBC WITH DIFFERENTIAL/PLATELET
Basophils Absolute: 0 10*3/uL (ref 0.0–0.2)
Basos: 0 %
EOS (ABSOLUTE): 0.1 10*3/uL (ref 0.0–0.4)
EOS: 1 %
Hematocrit: 38.9 % (ref 34.0–46.6)
Hemoglobin: 13.2 g/dL (ref 11.1–15.9)
IMMATURE GRANS (ABS): 0 10*3/uL (ref 0.0–0.1)
IMMATURE GRANULOCYTES: 0 %
LYMPHS: 22 %
Lymphocytes Absolute: 1.2 10*3/uL (ref 0.7–3.1)
MCH: 31.2 pg (ref 26.6–33.0)
MCHC: 33.9 g/dL (ref 31.5–35.7)
MCV: 92 fL (ref 79–97)
MONOS ABS: 0.8 10*3/uL (ref 0.1–0.9)
Monocytes: 14 %
NEUTROS PCT: 63 %
Neutrophils Absolute: 3.5 10*3/uL (ref 1.4–7.0)
PLATELETS: 262 10*3/uL (ref 150–450)
RBC: 4.23 x10E6/uL (ref 3.77–5.28)
RDW: 12.4 % (ref 11.7–15.4)
WBC: 5.6 10*3/uL (ref 3.4–10.8)

## 2018-04-04 LAB — TSH: TSH: 2.41 u[IU]/mL (ref 0.450–4.500)

## 2018-04-04 LAB — GLUCOSE, RANDOM: GLUCOSE: 89 mg/dL (ref 65–99)

## 2018-05-01 ENCOUNTER — Telehealth: Payer: Self-pay | Admitting: Family Medicine

## 2018-05-01 DIAGNOSIS — R400 Somnolence: Secondary | ICD-10-CM

## 2018-05-01 DIAGNOSIS — R0683 Snoring: Secondary | ICD-10-CM

## 2018-05-01 NOTE — Telephone Encounter (Signed)
Jody Hansen with the [facility] called & stated that pt's Jody Hansen denied request for an in-lab Jody study   They will cover a Jody Hansen - can we put in a new order for a [facility] so the [facility] can get that set up for the patient?

## 2018-05-01 NOTE — Telephone Encounter (Signed)
Order put in.

## 2018-05-01 NOTE — Telephone Encounter (Signed)
Go ahead with home sleep study order

## 2018-05-01 NOTE — Telephone Encounter (Signed)
Please advise 

## 2018-05-14 ENCOUNTER — Encounter: Payer: Self-pay | Admitting: Advanced Practice Midwife

## 2018-05-14 ENCOUNTER — Ambulatory Visit (INDEPENDENT_AMBULATORY_CARE_PROVIDER_SITE_OTHER): Payer: BLUE CROSS/BLUE SHIELD | Admitting: Advanced Practice Midwife

## 2018-05-14 ENCOUNTER — Other Ambulatory Visit: Payer: Self-pay

## 2018-05-14 DIAGNOSIS — Z30432 Encounter for removal of intrauterine contraceptive device: Secondary | ICD-10-CM

## 2018-05-14 MED ORDER — PNV PRENATAL PLUS MULTIVITAMIN 27-1 MG PO TABS
1.0000 | ORAL_TABLET | Freq: Every day | ORAL | 11 refills | Status: DC
Start: 2018-05-14 — End: 2019-08-28

## 2018-05-14 NOTE — Progress Notes (Signed)
  Jody Hansen 23 y.o.  There were no vitals filed for this visit. Past Medical History:  Diagnosis Date  . Kidney stones 2017  . Pregnant 01/23/2015  . Scoliosis    Past Surgical History:  Procedure Laterality Date  . NO PAST SURGERIES     family history includes Cancer in her maternal grandfather and maternal grandmother; Diabetes in her father, mother, paternal grandfather, paternal grandmother, and sister.  Current Outpatient Medications:  .  PARAGARD INTRAUTERINE COPPER IU, by Intrauterine route., Disp: , Rfl:     Here for IUD removal.  She had the paragard IUD placed 10 months ago and would like it removed because her boyfriend can feel the strings.  She would like to get pregnant, boyfriend not ready yet. . Her plans for future contraception are condoms.  A graves speculum was placed, and the strings were visible.  They were grasped with a curved Claiborne Billings and the IUD easily removed.  Pt given IUD removal f/u instructions.   Rx for PNV sent.

## 2018-05-28 ENCOUNTER — Ambulatory Visit (HOSPITAL_BASED_OUTPATIENT_CLINIC_OR_DEPARTMENT_OTHER): Payer: Medicaid Other

## 2018-07-09 ENCOUNTER — Encounter: Payer: Self-pay | Admitting: Advanced Practice Midwife

## 2018-07-10 ENCOUNTER — Encounter: Payer: Self-pay | Admitting: *Deleted

## 2018-07-11 ENCOUNTER — Other Ambulatory Visit: Payer: Self-pay

## 2018-07-11 ENCOUNTER — Ambulatory Visit (INDEPENDENT_AMBULATORY_CARE_PROVIDER_SITE_OTHER): Payer: Self-pay | Admitting: Adult Health

## 2018-07-11 ENCOUNTER — Encounter: Payer: Self-pay | Admitting: Adult Health

## 2018-07-11 DIAGNOSIS — Z3A01 Less than 8 weeks gestation of pregnancy: Secondary | ICD-10-CM | POA: Insufficient documentation

## 2018-07-11 DIAGNOSIS — O3680X0 Pregnancy with inconclusive fetal viability, not applicable or unspecified: Secondary | ICD-10-CM | POA: Insufficient documentation

## 2018-07-11 DIAGNOSIS — Z3201 Encounter for pregnancy test, result positive: Secondary | ICD-10-CM

## 2018-07-11 NOTE — Progress Notes (Signed)
Patient ID: Jody Hansen, female   DOB: 1996/01/31, 23 y.o.   MRN: [id number]   TELEHEALTH VIRTUAL GYNECOLOGY VISIT ENCOUNTER NOTE  I connected with Jody Hansen on 07/11/18 at  9:15 AM EDT by telephone at home and verified that I am speaking with the correct person using two identifiers.   I discussed the limitations, risks, security and privacy concerns of performing an evaluation and management service by telephone and the availability of in person appointments. I also discussed with the patient that there may be a patient responsible charge related to this service. The patient expressed understanding and agreed to proceed.   History:  Jody Hansen is a 23 y.o. G2P1001 white  Female,engaged, being evaluated today for missed period and had 6+HPTs.Her LMP was 06/08/18, so about 4+3 weeks with EDD 03/15/19. She has had mild cramping. She is working at a call center now. She denies any abnormal vaginal discharge, bleeding, pelvic pain or other concerns.    PCP is [facility].    Past Medical History:  Diagnosis Date  . Kidney stones 2017  . Pregnant 01/23/2015  . Scoliosis    Past Surgical History:  Procedure Laterality Date  . NO PAST SURGERIES     The following portions of the patient's history were reviewed and updated as appropriate: allergies, current medications, past family history, past medical history, past social history, past surgical history and problem list.   Health Maintenance:  Normal pap 06/13/17.  Review of Systems:  Pertinent items noted in HPI and remainder of comprehensive ROS otherwise negative.  Physical Exam:   General:  Alert, oriented and cooperative.   Mental Status: Normal mood and affect perceived. Normal judgment and thought content.  Physical exam deferred due to nature of the encounter LMP 06/08/2018 (Exact Date) per pt  Labs and Imaging No results found for this or any previous visit (from the past 336 hour(s)). No results found.     Assessment and Plan:     1. Positive pregnancy test +HPTs x 6 Push fluids    2. Less than [redacted] weeks gestation of pregnancy Will check QHCG to check number, and schedule dating [place] in about 3 weeks  - Beta hCG quant (ref lab)  3. Encounter to determine fetal viability of pregnancy, single or unspecified fetus - US OB Comp Less 14 Wks; Future       I discussed the assessment and treatment plan with the patient. The patient was provided an opportunity to ask questions and all were answered. The patient agreed with the plan and demonstrated an understanding of the instructions.   The patient was advised to call back or seek an in-person evaluation/go to the ED if the symptoms worsen or if the condition fails to improve as anticipated.  I provided  6 minutes of non-face-to-face time during this encounter.   Derrek Monaco, NP [facility], [place]

## 2018-07-12 ENCOUNTER — Other Ambulatory Visit: Payer: Self-pay | Admitting: Adult Health

## 2018-07-12 DIAGNOSIS — Z3A01 Less than 8 weeks gestation of pregnancy: Secondary | ICD-10-CM

## 2018-07-12 LAB — BETA HCG QUANT (REF LAB): hCG Quant: 56 m[IU]/mL

## 2018-07-12 NOTE — Progress Notes (Signed)
Reck QHCG friday

## 2018-07-13 DIAGNOSIS — Z3A01 Less than 8 weeks gestation of pregnancy: Secondary | ICD-10-CM | POA: Diagnosis not present

## 2018-07-14 LAB — BETA HCG QUANT (REF LAB): hCG Quant: 180 m[IU]/mL

## 2018-07-17 ENCOUNTER — Telehealth: Payer: Medicaid Other | Admitting: Physician Assistant

## 2018-07-17 ENCOUNTER — Telehealth: Payer: Self-pay | Admitting: Adult Health

## 2018-07-17 DIAGNOSIS — M545 Low back pain, unspecified: Secondary | ICD-10-CM

## 2018-07-17 NOTE — Telephone Encounter (Signed)
Patient sent a message stating that "Im having a cramping sensation in my left side of my body. I think it may be another kidney stone I have had it since 5pm yesterday and it hasnt went away it hurts from my left kidney all the way up to my ribs. It hasnt stopped ." Pt wants to see Anderson Malta. Please contact pt

## 2018-07-17 NOTE — Telephone Encounter (Signed)
Pt reports pain on the left side of her pelvis. It hurts when she moves. She states that it feels like the pain she has had with kidney stones. It feels better when she lays down. Patient would prefer to be seen in office. Advised that if pain becomes worse she would need to go to ER since she is early pregnant. Patient agreeable.

## 2018-07-17 NOTE — Progress Notes (Signed)
We are sorry that you are not feeling well.  Here is how we plan to help!  Based on what you have shared with me it looks like you mostly have acute back pain. However, given you are pregnant there are some other things to consider.  You can take tylenol 1000 mg every 8 for pain as this is safe in pregnancy.  Don't take any other meds as they can potentially harm the baby.  If you have any vaginal bleeding, fever, or blood in your urine then go directly to the women's hospital. I see that you have contacted you OB and I do recommend you go once you can get an appointment.   Acute back pain is defined as musculoskeletal pain that can resolve in 1-3 weeks with conservative treatment.  Some patients experience stomach irritation or in increased heartburn with anti-inflammatory drugs.  Please keep in mind that muscle relaxer's can cause fatigue and should not be taken while at work or driving.  Back pain is very common.  The pain often gets better over time.  The cause of back pain is usually not dangerous.  Most people can learn to manage their back pain on their own.  Home Care Stay active.  Start with short walks on flat ground if you can.  Try to walk farther each day. Do not sit, drive or stand in one place for more than 30 minutes.  Do not stay in bed. Do not avoid exercise or work.  Activity can help your back heal faster. Be careful when you bend or lift an object.  Bend at your knees, keep the object close to you, and do not twist. Sleep on a firm mattress.  Lie on your side, and bend your knees.  If you lie on your back, put a pillow under your knees. Only take medicines as told by your doctor. Put ice on the injured area. Put ice in a plastic bag Place a towel between your skin and the bag Leave the ice on for 15-20 minutes, 3-4 times a day for the first 2-3 days. 210 After that, you can switch between ice and heat packs. Ask your doctor about back exercises or massage. Avoid feeling  anxious or stressed.  Find good ways to deal with stress, such as exercise.  Get Help Right Way If: Your pain does not go away with rest or medicine. Your pain does not go away in 1 week. You have new problems. You do not feel well. The pain spreads into your legs. You cannot control when you poop (bowel movement) or pee (urinate) You feel sick to your stomach (nauseous) or throw up (vomit) You have belly (abdominal) pain. You feel like you may pass out (faint). If you develop a fever.  Make Sure you: Understand these instructions. Will watch your condition Will get help right away if you are not doing well or get worse.  Your e-visit answers were reviewed by a board certified advanced clinical practitioner to complete your personal care plan.  Depending on the condition, your plan could have included both over the counter or prescription medications.  If there is a problem please reply once you have received a response from your provider.  Your safety is important to [place].  If you have drug allergies check your prescription carefully.    You can use MyChart to ask questions about today's visit, request a non-urgent call back, or ask for a work or school excuse for 24 hours  related to this e-Visit. If it has been greater than 24 hours you will need to follow up with your provider, or enter a new e-Visit to address those concerns.  You will get an e-mail in the next two days asking about your experience.  I hope that your e-visit has been valuable and will speed your recovery. Thank you for using e-visits.   ===View-only below this line===   ----- Message -----    From: Jody Hansen    Sent: 07/17/2018 10:42 AM EDT      To: E-Visit Mailing List Subject: E-Visit Submission: Back Pain  E-Visit Submission: Back Pain --------------------------------  Question: Where are you having pain Answer:   Left lower back  Question: Does the pain extend into your legs? Answer:    No  Question: Are you having any numbness or weakness of the legs? Answer:   No  Question: Does this pain radiate to the abdomen or groin? Answer:   No  Question: How bad is the pain? Answer:   The pain is moderate  Question: Did you have an injury that caused the pain? Answer:   No, I cannot remember an injury  Question: How long has the pain been present? Answer:   Today and yesterday  Question: Have you had back pain in the past? Answer:   I have never had serious back pain before  Question: Do you have a fever? Answer:   No, I do not have a fever  Question: Do you have any of the following? Answer:   None of the above  Question: What makes the pain worse? Answer:   Any movement  Question: What makes the pain better Answer:   Lying in bed  Question: Do you get relief with certain positions (even if only partial relief)? Answer:   Yes  Question: Have you ever been diagnosed with cancer? Answer:   No  Question: Have you ever been diagnosed with arthritis? Answer:   No  Question: Have you ever been diagnosed with osteoporosis or any other bone weakness? Answer:   No  Question: Have you ever had surgery on your back or spine? Answer:   No  Question: Do you have a history of Intravenous Drug Use? Answer:   No  Question: What is your usual health status? Answer:   I am active and can move normally  Question: Are you pregnant? Answer:   I am pregnant  Question: Are you breastfeeding? Answer:   No  Question: Please list your medication allergies that you may have ? (If 'none' , please list as 'none') Answer:   None  Question: Please list any additional comments  Answer:   I have been having this pain in the middle section of my side since yesterday and I just want to make sure I'm okay and I want to make sure my baby is okay. I am currently pregnant.  A total of 5-10 minutes was spent evaluating this patients questionnaire and formulating a plan of care.

## 2018-07-18 ENCOUNTER — Encounter: Payer: Self-pay | Admitting: Obstetrics and Gynecology

## 2018-07-18 ENCOUNTER — Other Ambulatory Visit: Payer: Self-pay

## 2018-07-18 ENCOUNTER — Ambulatory Visit (INDEPENDENT_AMBULATORY_CARE_PROVIDER_SITE_OTHER): Payer: Medicaid Other | Admitting: Obstetrics and Gynecology

## 2018-07-18 ENCOUNTER — Ambulatory Visit (INDEPENDENT_AMBULATORY_CARE_PROVIDER_SITE_OTHER): Payer: Self-pay | Admitting: Obstetrics and Gynecology

## 2018-07-18 VITALS — BP 123/77 | HR 122 | Temp 98.8°F | Ht 62.0 in | Wt 204.0 lb

## 2018-07-18 DIAGNOSIS — O26891 Other specified pregnancy related conditions, first trimester: Secondary | ICD-10-CM

## 2018-07-18 DIAGNOSIS — Z3481 Encounter for supervision of other normal pregnancy, first trimester: Secondary | ICD-10-CM

## 2018-07-18 DIAGNOSIS — Z3A01 Less than 8 weeks gestation of pregnancy: Secondary | ICD-10-CM

## 2018-07-18 DIAGNOSIS — R102 Pelvic and perineal pain: Secondary | ICD-10-CM

## 2018-07-18 LAB — POCT URINALYSIS DIPSTICK OB
Blood, UA: NEGATIVE
Glucose, UA: NEGATIVE
Ketones, UA: NEGATIVE
Leukocytes, UA: NEGATIVE
Nitrite, UA: NEGATIVE
POC,PROTEIN,UA: NEGATIVE

## 2018-07-18 NOTE — Progress Notes (Signed)
Patient ID: Jody Hansen, female   DOB: 12/16/1995, 23 y.o.   MRN: [id number]    [facility] Visit  @DATE @            Patient name: Jody Hansen MRN [id number]  Date of birth: 01/30/1996  CC & HPI:  Jody Hansen is a 23 y.o. female presenting today for pain on her left side. Pain subsides when she sits or lies down and pain is worse when she is up walking. Pain started hurting when she on Monday.  Is newly pregnant. Has not had initial obstetrics visit nor u/s. Has some nausea no vomiting.  She had appropriately rising quantitative hCG from 50s to greater than 100 on 5/6-5/8 2020 She has had no bleeding spotting ROS:  ROS +left sided pain, minor discomfort +pregnant +nausea -fever -no vaginal bleeding  Pertinent History Reviewed:   Reviewed:  Medical         Past Medical History:  Diagnosis Date  . Kidney stones 2017  . Pregnant 01/23/2015  . Scoliosis                               Surgical Hx:    Past Surgical History:  Procedure Laterality Date  . NO PAST SURGERIES     Medications: Reviewed & Updated - see associated section                       Current Outpatient Medications:  .  Prenatal Vit-Fe Fumarate-FA (PNV PRENATAL PLUS MULTIVITAMIN) 27-1 MG TABS, Take 1 tablet by mouth daily., Disp: 30 tablet, Rfl: 11   Social History: Reviewed -  reports that she has never smoked. She has never used smokeless tobacco.  Objective Findings:  Vitals: Blood pressure 123/77, pulse (!) 122, temperature 98.8 F (37.1 C), height 5\' 2"  (1.575 m), weight 204 lb (92.5 kg), last menstrual period 06/08/2018.  PHYSICAL EXAMINATION General appearance - alert, well appearing, and in no distress Mental status - alert, oriented to person, place, and time, normal mood, behavior, speech, dress, motor activity, and thought processes, affect appropriate to mood  PELVIC Not examined  Assessment & Plan:   A:  1. Early gestation without apparent complication,  P:   1. HCG to be repeated 2. F/u 1 week TV u/s to confirm pregnancy location  By signing my name below, I, Samul Dada, attest that this documentation has been prepared under the direction and in the presence of Jonnie Kind, MD. Electronically Signed: [place]. 07/18/18. 12:04 PM.  I personally performed the services described in this documentation, which was SCRIBED in my presence. The recorded information has been reviewed and considered accurate. It has been edited as necessary during review. Jonnie Kind, MD

## 2018-07-19 LAB — BETA HCG QUANT (REF LAB): hCG Quant: 2380 m[IU]/mL

## 2018-07-25 ENCOUNTER — Encounter: Payer: Self-pay | Admitting: Radiology

## 2018-07-25 ENCOUNTER — Other Ambulatory Visit: Payer: Self-pay

## 2018-07-25 ENCOUNTER — Ambulatory Visit (INDEPENDENT_AMBULATORY_CARE_PROVIDER_SITE_OTHER): Payer: Self-pay

## 2018-07-25 DIAGNOSIS — Z3A01 Less than 8 weeks gestation of pregnancy: Secondary | ICD-10-CM

## 2018-07-25 DIAGNOSIS — O3680X0 Pregnancy with inconclusive fetal viability, not applicable or unspecified: Secondary | ICD-10-CM

## 2018-07-25 NOTE — Progress Notes (Signed)
[place] 6+2 wks GS w/ys,no fetal pole visualized,normal ovaries bilat,pt will come back in 10 days for f/u ultrasound

## 2018-08-01 ENCOUNTER — Other Ambulatory Visit: Payer: Medicaid Other

## 2018-08-02 ENCOUNTER — Other Ambulatory Visit: Payer: Self-pay | Admitting: Obstetrics and Gynecology

## 2018-08-02 DIAGNOSIS — O3680X0 Pregnancy with inconclusive fetal viability, not applicable or unspecified: Secondary | ICD-10-CM

## 2018-08-03 ENCOUNTER — Ambulatory Visit (INDEPENDENT_AMBULATORY_CARE_PROVIDER_SITE_OTHER): Payer: Self-pay

## 2018-08-03 ENCOUNTER — Other Ambulatory Visit: Payer: Self-pay

## 2018-08-03 DIAGNOSIS — Z3A08 8 weeks gestation of pregnancy: Secondary | ICD-10-CM

## 2018-08-03 DIAGNOSIS — O3680X0 Pregnancy with inconclusive fetal viability, not applicable or unspecified: Secondary | ICD-10-CM

## 2018-08-03 NOTE — Progress Notes (Signed)
[place] 6+5 wks,single IUP w/ys,positive fht 119 bpm,normal ovaries bilat,crl 8.47 mm

## 2018-08-24 ENCOUNTER — Telehealth: Payer: Self-pay | Admitting: Advanced Practice Midwife

## 2018-08-24 ENCOUNTER — Other Ambulatory Visit: Payer: Self-pay

## 2018-08-24 ENCOUNTER — Encounter (HOSPITAL_COMMUNITY): Payer: Self-pay | Admitting: Emergency Medicine

## 2018-08-24 ENCOUNTER — Emergency Department (HOSPITAL_COMMUNITY)
Admission: EM | Admit: 2018-08-24 | Discharge: 2018-08-24 | Disposition: A | Discharge status: Home / Self Care | Payer: Medicaid Other | Attending: Emergency Medicine | Admitting: Emergency Medicine

## 2018-08-24 DIAGNOSIS — Z3A09 9 weeks gestation of pregnancy: Secondary | ICD-10-CM | POA: Insufficient documentation

## 2018-08-24 DIAGNOSIS — O200 Threatened abortion: Secondary | ICD-10-CM | POA: Diagnosis not present

## 2018-08-24 DIAGNOSIS — O4690 Antepartum hemorrhage, unspecified, unspecified trimester: Secondary | ICD-10-CM

## 2018-08-24 DIAGNOSIS — O208 Other hemorrhage in early pregnancy: Secondary | ICD-10-CM | POA: Insufficient documentation

## 2018-08-24 LAB — URINALYSIS, ROUTINE W REFLEX MICROSCOPIC
Bilirubin Urine: NEGATIVE
Glucose, UA: NEGATIVE mg/dL
Ketones, ur: NEGATIVE mg/dL
Nitrite: NEGATIVE
Protein, ur: NEGATIVE mg/dL
RBC / HPF: >50 RBC/hpf — ABNORMAL HIGH (ref 0–5)
Specific Gravity, Urine: 1.019 (ref 1.005–1.030)
pH: 5 (ref 5.0–8.0)

## 2018-08-24 LAB — CBC WITH DIFFERENTIAL/PLATELET
Abs Immature Granulocytes: 0.02 10*3/uL (ref 0.00–0.07)
Basophils Absolute: 0 10*3/uL (ref 0.0–0.1)
Basophils Relative: 0 %
Eosinophils Absolute: 0.1 10*3/uL (ref 0.0–0.5)
Eosinophils Relative: 1 %
HCT: 42.1 % (ref 36.0–46.0)
Hemoglobin: 13.7 g/dL (ref 12.0–15.0)
Immature Granulocytes: 0 %
Lymphocytes Relative: 23 %
Lymphs Abs: 1.8 10*3/uL (ref 0.7–4.0)
MCH: 30.2 pg (ref 26.0–34.0)
MCHC: 32.5 g/dL (ref 30.0–36.0)
MCV: 92.7 fL (ref 80.0–100.0)
Monocytes Absolute: 0.6 10*3/uL (ref 0.1–1.0)
Monocytes Relative: 7 %
Neutro Abs: 5.4 10*3/uL (ref 1.7–7.7)
Neutrophils Relative %: 69 %
Platelets: 284 10*3/uL (ref 150–400)
RBC: 4.54 MIL/uL (ref 3.87–5.11)
RDW: 12.5 % (ref 11.5–15.5)
WBC: 7.8 10*3/uL (ref 4.0–10.5)
nRBC: 0 % (ref 0.0–0.2)

## 2018-08-24 LAB — BASIC METABOLIC PANEL
Anion gap: 10 (ref 5–15)
BUN: 8 mg/dL (ref 6–20)
CO2: 25 mmol/L (ref 22–32)
Calcium: 9.2 mg/dL (ref 8.9–10.3)
Chloride: 102 mmol/L (ref 98–111)
Creatinine, Ser: 0.67 mg/dL (ref 0.44–1.00)
GFR calc Af Amer: >60 mL/min (ref >60)
GFR calc non Af Amer: >60 mL/min (ref >60)
Glucose, Bld: 100 mg/dL — ABNORMAL HIGH (ref 70–99)
Potassium: 4 mmol/L (ref 3.5–5.1)
Sodium: 137 mmol/L (ref 135–145)

## 2018-08-24 LAB — HCG, QUANTITATIVE, PREGNANCY: hCG, Beta Chain, Quant, S: 7763 m[IU]/mL — ABNORMAL HIGH (ref <5)

## 2018-08-24 LAB — ABO/RH: ABO/RH(D): A POS

## 2018-08-24 NOTE — Discharge Instructions (Addendum)
Call [facility] for follow up on Monday.  You will need a repeat quantitative hcg blood test to tract your pregnancy.  It can be normal to have spotting early in pregnancy but as discussed can also be signs of an early miscarriage.  Do not have sex for the next 7 days as recommended by your gynecologist today.

## 2018-08-24 NOTE — Telephone Encounter (Signed)
LMOVM that spotting is normal in early pregnancy.  Advised pelvic rest for 7 days.  If bleeding increases or she starts passing clots, to go to the ER or call our office.

## 2018-08-24 NOTE — ED Notes (Signed)
ED Provider at bedside. 

## 2018-08-24 NOTE — Telephone Encounter (Signed)
Pt states that she started spotting lightly yesterday and is wanting to discuss with a nurse.

## 2018-08-24 NOTE — ED Triage Notes (Signed)
Patient is [redacted] weeks  Pregnant, last night noticed pink discharge, today bright red. Denies pain with voiding, nausea.

## 2018-08-25 ENCOUNTER — Encounter: Payer: Self-pay | Admitting: Advanced Practice Midwife

## 2018-08-25 NOTE — ED Provider Notes (Signed)
[facility] EMERGENCY DEPARTMENT Provider Note   CSN: [id number] Arrival date & time: 08/24/18  1740     History   Chief Complaint Chief Complaint  Patient presents with  . Vaginal Bleeding    HPI Jody Hansen is a 23 y.o. female who is G2P1,  Currently pregnant, currently 9 weeks per US imaging performed on 5/29  (EDD 03/24/19), IUP confirmed.  Presenting with vaginal spotting since last night. She reports pink tinged spotting on toilet tissue only x 2 followed by dark red spotting this evening.  She denies cramping or pelvic pain and is without any other symptoms including vaginal discharge, dizziness, n/v, dysuria.       The history is provided by the patient.    Past Medical History:  Diagnosis Date  . Kidney stones 2017  . Pregnant 01/23/2015  . Scoliosis     Patient Active Problem List   Diagnosis Date Noted  . Encounter to determine fetal viability of pregnancy 07/11/2018  . Less than [redacted] weeks gestation of pregnancy 07/11/2018  . Positive pregnancy test 07/11/2018  . Encounter for Nexplanon removal 07/12/2017  . Encounter for IUD removal 07/11/2017  . Nexplanon insertion 07/08/2016  . Migraines with aura 06/02/2016  . Ganglion cyst 08/10/2015  . Susceptible to varicella (non-immune), currently pregnant 02/17/2015  . Backache 07/03/2012  . Acne 07/03/2012  . Renal colic on right side 22/48/2500    Past Surgical History:  Procedure Laterality Date  . NO PAST SURGERIES       OB History    Gravida  2   Para  1   Term  1   Preterm      AB      Living  1     SAB      TAB      Ectopic      Multiple  0   Live Births  1            Home Medications    Prior to Admission medications   Medication Sig Start Date End Date Taking? Authorizing Provider  Prenatal Vit-Fe Fumarate-FA (PNV PRENATAL PLUS MULTIVITAMIN) 27-1 MG TABS Take 1 tablet by mouth daily. 05/14/18   Christin Fudge, CNM    Family History Family History   Problem Relation Age of Onset  . Diabetes Paternal Grandfather   . Diabetes Paternal Grandmother   . Cancer Maternal Grandmother   . Cancer Maternal Grandfather   . Diabetes Father   . Diabetes Mother   . Diabetes Sister        borderline    Social History Social History   Tobacco Use  . Smoking status: Never Smoker  . Smokeless tobacco: Never Used  Substance Use Topics  . Alcohol use: No  . Drug use: No     Allergies   Depo-provera [medroxyprogesterone acetate]   Review of Systems Review of Systems  Constitutional: Negative for chills and fever.  HENT: Negative.   Eyes: Negative.   Respiratory: Negative.   Gastrointestinal: Negative for abdominal pain, nausea and vomiting.  Genitourinary: Positive for vaginal bleeding. Negative for dysuria, flank pain, pelvic pain, vaginal discharge and vaginal pain.  Musculoskeletal: Negative for arthralgias, joint swelling and neck pain.  Skin: Negative.   Neurological: Negative for dizziness, weakness, light-headedness, numbness and headaches.  Psychiatric/Behavioral: Negative.      Physical Exam Updated Vital Signs BP 121/72   Pulse 84   Temp 98.3 F (36.8 C) (Oral)   Resp 18  Ht 5\' 1"  (1.549 m)   Wt 92.5 kg   LMP 06/08/2018 (Exact Date)   SpO2 99%   BMI 38.55 kg/m   Physical Exam Vitals signs and nursing note reviewed.  Constitutional:      Appearance: She is well-developed.  HENT:     Head: Normocephalic and atraumatic.  Eyes:     Conjunctiva/sclera: Conjunctivae normal.  Neck:     Musculoskeletal: Normal range of motion.  Cardiovascular:     Rate and Rhythm: Normal rate and regular rhythm.     Heart sounds: Normal heart sounds.  Pulmonary:     Effort: Pulmonary effort is normal.     Breath sounds: Normal breath sounds. No wheezing.  Abdominal:     General: Bowel sounds are normal.     Palpations: Abdomen is soft.     Tenderness: There is no abdominal tenderness. There is no guarding.   Musculoskeletal: Normal range of motion.  Skin:    General: Skin is warm and dry.  Neurological:     Mental Status: She is alert.      ED Treatments / Results  Labs (all labs ordered are listed, but only abnormal results are displayed) Labs Reviewed  URINALYSIS, ROUTINE W REFLEX MICROSCOPIC - Abnormal; Notable for the following components:      Result Value   APPearance CLOUDY (*)    Hgb urine dipstick LARGE (*)    Leukocytes,Ua SMALL (*)    RBC / HPF >50 (*)    Bacteria, UA RARE (*)    All other components within normal limits  BASIC METABOLIC PANEL - Abnormal; Notable for the following components:   Glucose, Bld 100 (*)    All other components within normal limits  HCG, QUANTITATIVE, PREGNANCY - Abnormal; Notable for the following components:   hCG, Beta Chain, Quant, S 7,763 (*)    All other components within normal limits  CBC WITH DIFFERENTIAL/PLATELET  ABO/RH    EKG    Radiology No results found.  Informal bedside US performed. Fetal heart activity documented at 148 bpm.    Procedures Procedures (including critical care time)  Medications Ordered in ED Medications - No data to display   Initial Impression / Assessment and Plan / ED Course  I have reviewed the triage vital signs and the nursing notes.  Pertinent labs & imaging results that were available during my care of the patient were reviewed by me and considered in my medical decision making (see chart for details).        Patient with minimal vaginal spotting in first trimester.  Discussed with patient that this can be a normal finding, but also cannot rule out early miscarriage.  She was encouraged [facility] for further evaluation as needed.  Discussed pelvic rest.  Also outlined return precautions.  Final Clinical Impressions(s) / ED Diagnoses   Final diagnoses:  Vaginal bleeding in pregnancy  Threatened miscarriage    ED Discharge Orders    None        Landis Martins 08/26/18 0038    Fredia Sorrow, MD 08/26/18 [phone]

## 2018-08-27 ENCOUNTER — Other Ambulatory Visit: Payer: Medicaid Other

## 2018-08-27 ENCOUNTER — Encounter (HOSPITAL_COMMUNITY): Payer: Self-pay | Admitting: Family Medicine

## 2018-08-27 ENCOUNTER — Telehealth: Payer: Self-pay | Admitting: Obstetrics and Gynecology

## 2018-08-27 ENCOUNTER — Other Ambulatory Visit: Payer: Self-pay

## 2018-08-27 DIAGNOSIS — O200 Threatened abortion: Secondary | ICD-10-CM | POA: Diagnosis not present

## 2018-08-27 DIAGNOSIS — O039 Complete or unspecified spontaneous abortion without complication: Secondary | ICD-10-CM | POA: Insufficient documentation

## 2018-08-27 DIAGNOSIS — Z3A10 10 weeks gestation of pregnancy: Secondary | ICD-10-CM | POA: Diagnosis not present

## 2018-08-27 DIAGNOSIS — N939 Abnormal uterine and vaginal bleeding, unspecified: Secondary | ICD-10-CM | POA: Diagnosis present

## 2018-08-27 NOTE — Telephone Encounter (Signed)
Pt states that she is wanting to be seen. She states she has been bleeding for 5 days and it is now heavier than it was.

## 2018-08-27 NOTE — Telephone Encounter (Signed)
mychart messages have been sent to patient. Will come for blood work this morning.

## 2018-08-27 NOTE — ED Triage Notes (Signed)
Patient states she is [redacted] weeks pregnant and experiencing vaginal bleeding. Also, patient thinks she might have passed some tissue. She was seen at [facility] ED on 06/19 for some pink discharge on 06/18 and bright red discharge on 06/19. Patient attempted to follow up with [facility] and they could not see her today.

## 2018-08-28 ENCOUNTER — Emergency Department (HOSPITAL_COMMUNITY): Payer: Medicaid Other

## 2018-08-28 ENCOUNTER — Emergency Department (HOSPITAL_COMMUNITY)
Admission: EM | Admit: 2018-08-28 | Discharge: 2018-08-28 | Disposition: A | Discharge status: Home / Self Care | Payer: Medicaid Other | Attending: Emergency Medicine | Admitting: Emergency Medicine

## 2018-08-28 ENCOUNTER — Telehealth: Payer: Self-pay | Admitting: Women's Health

## 2018-08-28 ENCOUNTER — Encounter: Payer: Self-pay | Admitting: Advanced Practice Midwife

## 2018-08-28 DIAGNOSIS — O039 Complete or unspecified spontaneous abortion without complication: Secondary | ICD-10-CM

## 2018-08-28 LAB — HCG, QUANTITATIVE, PREGNANCY: hCG, Beta Chain, Quant, S: 511 m[IU]/mL — ABNORMAL HIGH

## 2018-08-28 LAB — I-STAT BETA HCG BLOOD, ED (MC, WL, AP ONLY): I-stat hCG, quantitative: 505.2 m[IU]/mL — ABNORMAL HIGH (ref <5)

## 2018-08-28 LAB — BETA HCG QUANT (REF LAB): hCG Quant: 652 m[IU]/mL

## 2018-08-28 NOTE — ED Provider Notes (Signed)
[facility] DEPT Provider Note   CSN: [id number] Arrival date & time: 08/27/18  2132     History   Chief Complaint Chief Complaint  Patient presents with  . Vaginal Bleeding    HPI Jody Hansen is a 23 y.o. female.     Patient to ED for vaginal bleeding in pregnancy. She is a G2P1, first pregnancy resulting in full term uncomplicated birth of healthy girl. She had started prenatal care and had an [place] on 08/03/18 that confirmed an IUP. Now, at estimated 10 weeks, she is having vaginal bleeding and intermittent cramping that started as spotting 2-3 days ago and has progressed to passing dark red blood, clots and what she believes might have been products of conception. No fever, vomiting, urinary symptoms.   The history is provided by the patient. No language interpreter was used.  Vaginal Bleeding Associated symptoms: no abdominal pain, no back pain, no dizziness and no fever     Past Medical History:  Diagnosis Date  . Kidney stones 2017  . Pregnant 01/23/2015  . Scoliosis     Patient Active Problem List   Diagnosis Date Noted  . Encounter to determine fetal viability of pregnancy 07/11/2018  . Less than [redacted] weeks gestation of pregnancy 07/11/2018  . Positive pregnancy test 07/11/2018  . Encounter for Nexplanon removal 07/12/2017  . Encounter for IUD removal 07/11/2017  . Nexplanon insertion 07/08/2016  . Migraines with aura 06/02/2016  . Ganglion cyst 08/10/2015  . Susceptible to varicella (non-immune), currently pregnant 02/17/2015  . Backache 07/03/2012  . Acne 07/03/2012  . Renal colic on right side 46/27/0350    Past Surgical History:  Procedure Laterality Date  . NO PAST SURGERIES       OB History    Gravida  2   Para  1   Term  1   Preterm      AB      Living  1     SAB      TAB      Ectopic      Multiple  0   Live Births  1            Home Medications    Prior to Admission medications    Medication Sig Start Date End Date Taking? Authorizing Provider  Prenatal Vit-Fe Fumarate-FA (PNV PRENATAL PLUS MULTIVITAMIN) 27-1 MG TABS Take 1 tablet by mouth daily. 05/14/18  Yes Cresenzo-Dishmon, Joaquim Lai, CNM    Family History Family History  Problem Relation Age of Onset  . Diabetes Paternal Grandfather   . Diabetes Paternal Grandmother   . Cancer Maternal Grandmother   . Cancer Maternal Grandfather   . Diabetes Father   . Diabetes Mother   . Diabetes Sister        borderline    Social History Social History   Tobacco Use  . Smoking status: Never Smoker  . Smokeless tobacco: Never Used  Substance Use Topics  . Alcohol use: No  . Drug use: No     Allergies   Depo-provera [medroxyprogesterone acetate]   Review of Systems Review of Systems  Constitutional: Negative for fever.  Gastrointestinal: Negative for abdominal pain and vomiting.  Genitourinary: Positive for vaginal bleeding.  Musculoskeletal: Negative for back pain.  Neurological: Negative for dizziness and weakness.     Physical Exam Updated Vital Signs BP (!) 139/93 (BP Location: Right Arm)   Pulse 96   Temp 98.7 F (37.1 C) (Oral)   Resp  15   Ht 5\' 1"  (1.549 m)   Wt 93 kg   LMP 06/08/2018 (Exact Date)   SpO2 98%   BMI 38.73 kg/m   Physical Exam Vitals signs and nursing note reviewed.  Cardiovascular:     Rate and Rhythm: Normal rate.  Pulmonary:     Effort: Pulmonary effort is normal.  Abdominal:     Palpations: Abdomen is soft.     Tenderness: There is no abdominal tenderness.  Genitourinary:    Comments: Pooling dark blood in vaginal vault with few clots. There are POC visualized in the cervix. No purulence. Minimally tender cervix.  Musculoskeletal: Normal range of motion.  Skin:    General: Skin is warm and dry.     Coloration: Skin is not pale.  Neurological:     General: No focal deficit present.     Mental Status: She is oriented to person, place, and time.      ED  Treatments / Results  Labs (all labs ordered are listed, but only abnormal results are displayed) Labs Reviewed  I-STAT BETA HCG BLOOD, ED ([facility], [facility], [facility] ONLY) - Abnormal; Notable for the following components:      Result Value   I-stat hCG, quantitative 505.2 (*)    All other components within normal limits    EKG    Radiology No results found.  Procedures Procedures (including critical care time)  Medications Ordered in ED Medications - No data to display   Initial Impression / Assessment and Plan / ED Course  I have reviewed the triage vital signs and the nursing notes.  Pertinent labs & imaging results that were available during my care of the patient were reviewed by me and considered in my medical decision making (see chart for details).        Patient to ED with increasing vaginal bleeding over the last 2-3 days. Tonight passing what she feels was POC.   The patient is very well appearing. Chart reviewed. Prenatal care by Dr. Glo Herring, with [place] on 5/22 showing a [redacted]w[redacted]d IUP.   Exam concerning for active miscarriage. Will repeat US and quant beta. Plan on OB consult for further management.   US shows that the pregnancy previous seen on [place] is no longer in the uterus. Repeat Hcg now 511, down from >7700 4 days ago.   Discussed with Dr. Rosana Hoes [facility]) who advised no medications are recommended. She will need close follow up with Dr. Glo Herring in the office. The patient is updated on results and need for close follow up which she acknowledges.   Final Clinical Impressions(s) / ED Diagnoses   Final diagnoses:  None   1. Miscarriage  ED Discharge Orders    None       Charlann Lange, Hershal Coria 08/28/18 0708    Ward, Delice Bison, DO 08/31/18 2303

## 2018-08-28 NOTE — ED Notes (Signed)
Pelvic cart at beside, setup and ready for exam

## 2018-08-28 NOTE — Telephone Encounter (Signed)
LMOVM that HCG is indeed dropping and she will need repeat HCG in 1 week to make sure it is still dropping.

## 2018-08-28 NOTE — Discharge Instructions (Addendum)
Please follow up with Dr. Glo Herring this week for recheck.   If you develop a fever, have lower abdominal/pelvic pain, severe bleeding, please go to [facility] (located in [facility]) or to your nearest emergency room for further evaluation.

## 2018-08-28 NOTE — Telephone Encounter (Signed)
Patient called, stated that she went to the ER at [facility] yesterday and they told her she miscarried.  She was told to follow up with [place] this week.  The schedule is full, please advise.  [phone]

## 2018-09-04 ENCOUNTER — Other Ambulatory Visit: Payer: Self-pay

## 2018-09-04 ENCOUNTER — Other Ambulatory Visit: Payer: Medicaid Other

## 2018-09-04 DIAGNOSIS — O039 Complete or unspecified spontaneous abortion without complication: Secondary | ICD-10-CM | POA: Diagnosis not present

## 2018-09-05 ENCOUNTER — Other Ambulatory Visit: Payer: Self-pay | Admitting: Adult Health

## 2018-09-05 DIAGNOSIS — O039 Complete or unspecified spontaneous abortion without complication: Secondary | ICD-10-CM

## 2018-09-05 LAB — BETA HCG QUANT (REF LAB): hCG Quant: 20 m[IU]/mL

## 2018-09-05 NOTE — Progress Notes (Signed)
Ck QHCG in 2 weeks, order in.

## 2018-09-06 ENCOUNTER — Encounter: Payer: Self-pay | Admitting: *Deleted

## 2018-09-19 ENCOUNTER — Ambulatory Visit: Payer: Medicaid Other | Admitting: *Deleted

## 2018-09-19 ENCOUNTER — Other Ambulatory Visit: Payer: Medicaid Other

## 2018-09-19 ENCOUNTER — Encounter: Payer: Medicaid Other | Admitting: Advanced Practice Midwife

## 2018-10-02 ENCOUNTER — Encounter: Payer: Self-pay | Admitting: Advanced Practice Midwife

## 2018-10-02 DIAGNOSIS — O039 Complete or unspecified spontaneous abortion without complication: Secondary | ICD-10-CM | POA: Diagnosis not present

## 2018-10-03 LAB — BETA HCG QUANT (REF LAB): hCG Quant: 1 m[IU]/mL

## 2018-11-23 ENCOUNTER — Encounter: Payer: Self-pay | Admitting: Family Medicine

## 2018-11-23 NOTE — Telephone Encounter (Signed)
Called pt and she wanted to be seen on October 9th for depression. States she does not have any thoughts of hurting herself or anyone else. Advised pt to call back sooner if she feels she needs to be seen sooner. Pt verbalized understanding.

## 2018-12-05 ENCOUNTER — Encounter: Payer: Self-pay | Admitting: *Deleted

## 2018-12-06 ENCOUNTER — Ambulatory Visit (INDEPENDENT_AMBULATORY_CARE_PROVIDER_SITE_OTHER): Payer: Medicaid Other | Admitting: *Deleted

## 2018-12-06 ENCOUNTER — Other Ambulatory Visit: Payer: Self-pay

## 2018-12-06 VITALS — BP 111/67 | HR 84 | Ht 62.0 in | Wt 201.5 lb

## 2018-12-06 DIAGNOSIS — Z3201 Encounter for pregnancy test, result positive: Secondary | ICD-10-CM

## 2018-12-06 DIAGNOSIS — O3680X0 Pregnancy with inconclusive fetal viability, not applicable or unspecified: Secondary | ICD-10-CM

## 2018-12-06 LAB — POCT URINE PREGNANCY: Preg Test, Ur: POSITIVE — AB

## 2018-12-06 NOTE — Progress Notes (Signed)
   NURSE VISIT- PREGNANCY CONFIRMATION   SUBJECTIVE:  Jody Hansen is a 23 y.o. 0000000 female at by certain LMP of [id number]. Here for pregnancy confirmation.  Home pregnancy test: positive x 6  She reports no complaints.  She is taking prenatal vitamins.    OBJECTIVE:  BP 111/67 (BP Location: Left Arm, Patient Position: Sitting, Cuff Size: Normal)   Pulse 84   Ht 5\' 2"  (1.575 m)   Wt 201 lb 8 oz (91.4 kg)   LMP 11/03/2018 (Exact Date)   Breastfeeding No   BMI 36.85 kg/m   Appears well, in no apparent distress OB History  Gravida Para Term Preterm AB Living  3 1 1   1 1   SAB TAB Ectopic Multiple Live Births  1     0 1    # Outcome Date GA Lbr Len/2nd Weight Sex Delivery Anes PTL Lv  3 Current           2 SAB 08/2018          1 Term 09/19/15 [redacted]w[redacted]d 21:20 / 00:46 7 lb 9.5 oz (3.445 kg) F Vag-Spont EPI  LIV    Results for orders placed or performed in visit on 12/06/18 (from the past 24 hour(s))  POCT urine pregnancy   Collection Time: 12/06/18 10:47 AM  Result Value Ref Range   Preg Test, Ur Positive (A) Negative    ASSESSMENT: Positive pregnancy test by exact LMP 11/03/2018.  PLAN: Schedule for dating ultrasound in 2 weeks Prenatal vitamins: continue   Nausea medicines: not currently needed   OB packet given: Yes  Alice Rieger  12/06/2018 10:47 AM

## 2018-12-12 NOTE — Progress Notes (Signed)
Chart reviewed for nurse visit. Agree with plan of care.   Estill Dooms, NP 12/12/2018 3:12 PM

## 2018-12-14 ENCOUNTER — Encounter: Payer: Self-pay | Admitting: Family Medicine

## 2018-12-14 ENCOUNTER — Ambulatory Visit (INDEPENDENT_AMBULATORY_CARE_PROVIDER_SITE_OTHER): Payer: Medicaid Other | Admitting: Family Medicine

## 2018-12-14 ENCOUNTER — Other Ambulatory Visit: Payer: Self-pay

## 2018-12-14 VITALS — Temp 97.6°F | Wt 207.0 lb

## 2018-12-14 DIAGNOSIS — F439 Reaction to severe stress, unspecified: Secondary | ICD-10-CM | POA: Diagnosis not present

## 2018-12-14 NOTE — Progress Notes (Signed)
   Subjective:    Patient ID: Jody Hansen, female    DOB: [birth year], 23 y.o.   MRN: [id number]  HPI Mild to moderate depression not suicidal.  Is [redacted] weeks pregnant so therefore medication is not an option we discussed a lot of what is going on with her she is very stressed out because she goes to school early in the morning she works all day long then she takes care of her child in the evening and does not get much help from the dad we talked about strategies to try to improve them to improve communications with the diet and also try to get the child to bed earlier that would free up some time for the patient denies being suicidal.  The patient does not want to do any counseling.  She will do a follow-up with [place] in 3 to 4 weeks.   Review of Systems  Constitutional: Negative for activity change and appetite change.  HENT: Negative for congestion and rhinorrhea.   Respiratory: Negative for cough and shortness of breath.   Cardiovascular: Negative for chest pain and leg swelling.  Gastrointestinal: Negative for abdominal pain, nausea and vomiting.  Skin: Negative for color change.  Neurological: Negative for dizziness and weakness.  Psychiatric/Behavioral: Negative for agitation and confusion.       Objective:   Physical Exam  Lungs clear respiratory rate normal heart regular no murmurs skin warm dry neurologic grossly normal      Assessment & Plan:  Significant stress at home Unfortunately her significant other is not looking out for her helping out There is some depression and anxiety but the patient is pregnant I do not recommend medication I offered counseling she defers She is not suicidal I did recommend a follow-up in 4 to 6 weeks or she could send [place] a message via my chart  25 minutes spent with the patient talking about many different ways of trying to tackle this issue to get her husband to help out more around the house

## 2018-12-19 ENCOUNTER — Encounter: Payer: Self-pay | Admitting: Advanced Practice Midwife

## 2018-12-20 ENCOUNTER — Other Ambulatory Visit: Payer: Self-pay

## 2018-12-20 ENCOUNTER — Other Ambulatory Visit: Payer: Self-pay | Admitting: Women's Health

## 2018-12-20 ENCOUNTER — Ambulatory Visit (INDEPENDENT_AMBULATORY_CARE_PROVIDER_SITE_OTHER): Payer: Medicaid Other

## 2018-12-20 DIAGNOSIS — O3680X0 Pregnancy with inconclusive fetal viability, not applicable or unspecified: Secondary | ICD-10-CM | POA: Diagnosis not present

## 2018-12-20 DIAGNOSIS — Z3A01 Less than 8 weeks gestation of pregnancy: Secondary | ICD-10-CM | POA: Diagnosis not present

## 2018-12-20 MED ORDER — DOXYLAMINE-PYRIDOXINE 10-10 MG PO TBEC: DELAYED_RELEASE_TABLET | ORAL | 6 refills | Status: DC

## 2018-12-20 NOTE — Progress Notes (Signed)
Korea 5+5 wks,single IUP w/ys,crl 2.33mm,positive fht 99 bpm,normal ovaries

## 2018-12-31 ENCOUNTER — Telehealth: Payer: Self-pay | Admitting: Family Medicine

## 2018-12-31 NOTE — Telephone Encounter (Signed)
Please go ahead and give her a work note

## 2018-12-31 NOTE — Telephone Encounter (Signed)
Pt called the nurse line Saturday after having a fall (pt is pregnant), was advised by nurse line to take a "rest day"   Nurse line advised pt to call here for a work excuse  Pt had to leave work early on Saturday, needs work excuse for Saturday  Can we give?  Please advise & call pt when done (pt states she can access & print her note from MyChart if we agree to give her one)

## 2019-01-01 ENCOUNTER — Encounter: Payer: Self-pay | Admitting: Family Medicine

## 2019-01-07 ENCOUNTER — Other Ambulatory Visit: Payer: Self-pay | Admitting: Women's Health

## 2019-01-07 MED ORDER — PROMETHAZINE HCL 25 MG PO TABS: 12.5000 mg | ORAL_TABLET | Freq: Four times a day (QID) | ORAL | 0 refills | Status: DC | PRN

## 2019-01-21 ENCOUNTER — Other Ambulatory Visit: Payer: Self-pay

## 2019-01-21 ENCOUNTER — Encounter (HOSPITAL_COMMUNITY): Payer: Self-pay | Admitting: *Deleted

## 2019-01-21 DIAGNOSIS — R109 Unspecified abdominal pain: Secondary | ICD-10-CM | POA: Insufficient documentation

## 2019-01-21 DIAGNOSIS — Z5321 Procedure and treatment not carried out due to patient leaving prior to being seen by health care provider: Secondary | ICD-10-CM | POA: Diagnosis not present

## 2019-01-21 LAB — URINALYSIS, ROUTINE W REFLEX MICROSCOPIC
Bilirubin Urine: NEGATIVE
Glucose, UA: NEGATIVE mg/dL
Hgb urine dipstick: NEGATIVE
Ketones, ur: NEGATIVE mg/dL
Nitrite: NEGATIVE
Protein, ur: NEGATIVE mg/dL
Specific Gravity, Urine: 1.021 (ref 1.005–1.030)
pH: 6 (ref 5.0–8.0)

## 2019-01-21 NOTE — ED Triage Notes (Signed)
Pt c/o left side abdominal cramping that started earlier today; pt denies any vaginal bleeding or discharge; pt called the after hours line and was told to come to ED

## 2019-01-22 ENCOUNTER — Emergency Department (HOSPITAL_COMMUNITY)
Admission: EM | Admit: 2019-01-22 | Discharge: 2019-01-22 | Disposition: A | Discharge status: Home / Self Care | Payer: Medicaid Other | Attending: Emergency Medicine | Admitting: Emergency Medicine

## 2019-01-22 ENCOUNTER — Telehealth: Payer: Self-pay | Admitting: *Deleted

## 2019-01-22 NOTE — ED Notes (Signed)
Pt notified registration they were leaving facility

## 2019-01-22 NOTE — Telephone Encounter (Signed)
Patient states she went to the ER last night and waited 8 hours but left because she was tired of waiting.  States she went to the bathroom and had a bowel movement but noticed blood in the stool.  Was having some mild cramping but is not currently cramping.  Informed patient she could have an internal hemorrhoid which can cause the bleeding but to just continue to monitor.  If she noticed cramping increased or if she started having vaginal bleeding, to let us know.  In the mean time, she could take a stool sofner or use preparationH or witch hazel pads.  Pt verbalized understanding with no further questions.

## 2019-01-25 ENCOUNTER — Other Ambulatory Visit: Payer: Self-pay

## 2019-01-25 ENCOUNTER — Encounter (HOSPITAL_COMMUNITY): Payer: Self-pay

## 2019-01-25 ENCOUNTER — Emergency Department (HOSPITAL_COMMUNITY)
Admission: EM | Admit: 2019-01-25 | Discharge: 2019-01-26 | Disposition: A | Discharge status: Home / Self Care | Payer: Medicaid Other | Attending: Emergency Medicine | Admitting: Emergency Medicine

## 2019-01-25 DIAGNOSIS — O2311 Infections of bladder in pregnancy, first trimester: Secondary | ICD-10-CM | POA: Insufficient documentation

## 2019-01-25 DIAGNOSIS — B9689 Other specified bacterial agents as the cause of diseases classified elsewhere: Secondary | ICD-10-CM | POA: Insufficient documentation

## 2019-01-25 DIAGNOSIS — Z3A11 11 weeks gestation of pregnancy: Secondary | ICD-10-CM | POA: Diagnosis not present

## 2019-01-25 DIAGNOSIS — N3000 Acute cystitis without hematuria: Secondary | ICD-10-CM

## 2019-01-25 DIAGNOSIS — Z79899 Other long term (current) drug therapy: Secondary | ICD-10-CM | POA: Diagnosis not present

## 2019-01-25 DIAGNOSIS — K921 Melena: Secondary | ICD-10-CM | POA: Insufficient documentation

## 2019-01-25 DIAGNOSIS — O99891 Other specified diseases and conditions complicating pregnancy: Secondary | ICD-10-CM | POA: Insufficient documentation

## 2019-01-25 NOTE — ED Triage Notes (Signed)
Pt reports left sided abd pain and nausea since 01/22/19, pt was seen here on that day and left prior being seen, pt says she saw her urine results in "mychart and googled the results."

## 2019-01-26 LAB — URINALYSIS, ROUTINE W REFLEX MICROSCOPIC
Bacteria, UA: NONE SEEN
Bilirubin Urine: NEGATIVE
Glucose, UA: NEGATIVE mg/dL
Hgb urine dipstick: NEGATIVE
Ketones, ur: 5 mg/dL — AB
Nitrite: NEGATIVE
Protein, ur: NEGATIVE mg/dL
Specific Gravity, Urine: 1.032 — ABNORMAL HIGH (ref 1.005–1.030)
pH: 5 (ref 5.0–8.0)

## 2019-01-26 MED ORDER — ACETAMINOPHEN 325 MG PO TABS
650.0000 mg | ORAL_TABLET | Freq: Once | ORAL | Status: AC
  Administered 2019-01-26: 650 mg via ORAL
  Filled 2019-01-26: qty 2

## 2019-01-26 MED ORDER — CEPHALEXIN 500 MG PO CAPS
500.0000 mg | ORAL_CAPSULE | Freq: Once | ORAL | Status: AC
  Administered 2019-01-26: 500 mg via ORAL
  Filled 2019-01-26: qty 1

## 2019-01-26 MED ORDER — CEPHALEXIN 500 MG PO CAPS: 500.0000 mg | ORAL_CAPSULE | Freq: Two times a day (BID) | ORAL | 0 refills | Status: DC

## 2019-01-26 NOTE — ED Provider Notes (Signed)
[facility] EMERGENCY DEPARTMENT Provider Note   CSN: [id number] Arrival date & time: 01/25/19  2018     History   Chief Complaint Chief Complaint  Patient presents with  . Abdominal Pain    11 weeks preg    HPI Jody Hansen is a 23 y.o. female.     The history is provided by the patient.  Abdominal Pain Pain location:  LLQ Pain quality: aching   Pain radiates to:  Does not radiate Pain severity:  Moderate Onset quality:  Gradual Timing:  Intermittent Progression:  Unchanged Chronicity:  New Relieved by:  Nothing Worsened by:  Nothing Associated symptoms: diarrhea   Associated symptoms: no dysuria, no fever, no vaginal bleeding and no vomiting   Patient with history of kidney stones, currently [redacted] weeks pregnant presents with left lower quadrant abdominal pain.  She has had this for the past several days.  She also reports intermittent diarrhea.  She did have 1 episode of bloody diarrhea that is improved. No fevers or vomiting.  No vaginal bleeding.  No new vaginal discharge.  She had no complications thus far with her pregnancy   Past Medical History:  Diagnosis Date  . Kidney stones 2017  . Pregnant 01/23/2015  . Scoliosis     Patient Active Problem List   Diagnosis Date Noted  . Encounter to determine fetal viability of pregnancy 07/11/2018  . Less than [redacted] weeks gestation of pregnancy 07/11/2018  . Positive pregnancy test 07/11/2018  . Encounter for Nexplanon removal 07/12/2017  . Encounter for IUD removal 07/11/2017  . Nexplanon insertion 07/08/2016  . Migraines with aura 06/02/2016  . Ganglion cyst 08/10/2015  . Susceptible to varicella (non-immune), currently pregnant 02/17/2015  . Backache 07/03/2012  . Acne 07/03/2012  . Renal colic on right side [id number]    Past Surgical History:  Procedure Laterality Date  . NO PAST SURGERIES       OB History    Gravida  3   Para  1   Term  1   Preterm      AB  1   Living  1     SAB  1    TAB      Ectopic      Multiple  0   Live Births  1            Home Medications    Prior to Admission medications   Medication Sig Start Date End Date Taking? Authorizing Provider  cephALEXin (KEFLEX) 500 MG capsule Take 1 capsule (500 mg total) by mouth 2 (two) times daily. 2 caps po bid x 7 days 01/26/19   Ripley Fraise, MD  Doxylamine-Pyridoxine (DICLEGIS) 10-10 MG TBEC 2 tabs q hs, if sx persist add 1 tab q am on day 3, if sx persist add 1 tab q afternoon on day 4 12/20/18   Roma Schanz, CNM  Prenatal Vit-Fe Fumarate-FA (PNV PRENATAL PLUS MULTIVITAMIN) 27-1 MG TABS Take 1 tablet by mouth daily. 05/14/18   Cresenzo-Dishmon, Joaquim Lai, CNM  promethazine (PHENERGAN) 25 MG tablet Take 0.5-1 tablets (12.5-25 mg total) by mouth every 6 (six) hours as needed for nausea or vomiting. 01/07/19   Roma Schanz, CNM    Family History Family History  Problem Relation Age of Onset  . Diabetes Paternal Grandfather   . Diabetes Paternal Grandmother   . Cancer Maternal Grandmother   . Cancer Maternal Grandfather   . Diabetes Father   . Diabetes Mother   .  Diabetes Sister        borderline    Social History Social History   Tobacco Use  . Smoking status: Never Smoker  . Smokeless tobacco: Never Used  Substance Use Topics  . Alcohol use: No  . Drug use: No     Allergies   Depo-provera [medroxyprogesterone acetate]   Review of Systems Review of Systems  Constitutional: Negative for fever.  Gastrointestinal: Positive for abdominal pain, blood in stool and diarrhea. Negative for vomiting.  Genitourinary: Negative for dysuria and vaginal bleeding.  All other systems reviewed and are negative.    Physical Exam Updated Vital Signs BP (!) 142/69 (BP Location: Right Arm)   Pulse 84   Temp 99 F (37.2 C) (Oral)   Resp 17   Ht 1.575 m (5\' 2" )   Wt 94.8 kg   LMP 11/03/2018 (Exact Date)   SpO2 100%   BMI 38.23 kg/m   Physical Exam CONSTITUTIONAL: Well  developed/well nourished HEAD: Normocephalic/atraumatic EYES: EOMI/PERRL ENMT: Mucous membranes moist NECK: supple no meningeal signs SPINE/BACK:entire spine nontender CV: S1/S2 noted, no murmurs/rubs/gallops noted LUNGS: Lungs are clear to auscultation bilaterally, no apparent distress ABDOMEN: soft, nontender, no rebound or guarding, bowel sounds noted throughout abdomen GU:no cva tenderness NEURO: Pt is awake/alert/appropriate, moves all extremitiesx4.  No facial droop.   EXTREMITIES: pulses normal/equal, full ROM SKIN: warm, color normal PSYCH: no abnormalities of mood noted, alert and oriented to situation   ED Treatments / Results  Labs (all labs ordered are listed, but only abnormal results are displayed) Labs Reviewed  URINALYSIS, ROUTINE W REFLEX MICROSCOPIC - Abnormal; Notable for the following components:      Result Value   APPearance HAZY (*)    Specific Gravity, Urine 1.032 (*)    Ketones, ur 5 (*)    Leukocytes,Ua SMALL (*)    All other components within normal limits  URINE CULTURE    EKG None  Radiology No results found.  Procedures Procedures    Medications Ordered in ED Medications  cephALEXin (KEFLEX) capsule 500 mg (500 mg Oral Given 01/26/19 0216)  acetaminophen (TYLENOL) tablet 650 mg (650 mg Oral Given 01/26/19 0216)     Initial Impression / Assessment and Plan / ED Course  I have reviewed the triage vital signs and the nursing notes.  Pertinent labs results that were available during my care of the patient were reviewed by me and considered in my medical decision making (see chart for details).        Patient very well-appearing.  No focal tenderness.  She is afebrile.  Denies any recent vaginal bleeding.  She is concerned that she may have a UTI.  Will send urine culture and start her on Keflex.  She does report recent diarrhea that is improved.  She did have an isolated episode of bloody diarrhea that is resolved.  This likely due to  hemorrhoid She is already had an ultrasound confirms an IUP She will follow-up with her OB/GYN.  We discussed return precautions  Final Clinical Impressions(s) / ED Diagnoses   Final diagnoses:  Acute cystitis without hematuria    ED Discharge Orders         Ordered    cephALEXin (KEFLEX) 500 MG capsule  2 times daily     01/26/19 0221           Ripley Fraise, MD 01/26/19 0234

## 2019-01-27 LAB — URINE CULTURE: Culture: <10000 — AB

## 2019-02-04 ENCOUNTER — Other Ambulatory Visit: Payer: Self-pay | Admitting: Obstetrics & Gynecology

## 2019-02-04 DIAGNOSIS — Z3682 Encounter for antenatal screening for nuchal translucency: Secondary | ICD-10-CM

## 2019-02-05 ENCOUNTER — Other Ambulatory Visit: Payer: Medicaid Other

## 2019-02-05 ENCOUNTER — Ambulatory Visit: Payer: Medicaid Other | Admitting: *Deleted

## 2019-02-05 ENCOUNTER — Encounter: Payer: Medicaid Other | Admitting: Women's Health

## 2019-02-05 ENCOUNTER — Other Ambulatory Visit: Payer: Self-pay

## 2019-02-05 ENCOUNTER — Ambulatory Visit (INDEPENDENT_AMBULATORY_CARE_PROVIDER_SITE_OTHER): Payer: Medicaid Other

## 2019-02-05 DIAGNOSIS — Z3A12 12 weeks gestation of pregnancy: Secondary | ICD-10-CM

## 2019-02-05 DIAGNOSIS — Z1379 Encounter for other screening for genetic and chromosomal anomalies: Secondary | ICD-10-CM | POA: Diagnosis not present

## 2019-02-05 DIAGNOSIS — Z3480 Encounter for supervision of other normal pregnancy, unspecified trimester: Secondary | ICD-10-CM

## 2019-02-05 DIAGNOSIS — Z3682 Encounter for antenatal screening for nuchal translucency: Secondary | ICD-10-CM | POA: Diagnosis not present

## 2019-02-05 NOTE — Progress Notes (Signed)
[place] 12+3 wks,measurements c/w dates,fhr 170 bpm,crl 70.95 mm,anterior placenta,normal ovaries,NB present,NT 1.6 mm

## 2019-02-11 ENCOUNTER — Encounter: Payer: Self-pay | Admitting: Women's Health

## 2019-02-11 ENCOUNTER — Ambulatory Visit: Payer: Medicaid Other | Admitting: *Deleted

## 2019-02-11 ENCOUNTER — Ambulatory Visit (INDEPENDENT_AMBULATORY_CARE_PROVIDER_SITE_OTHER): Payer: Medicaid Other | Admitting: Women's Health

## 2019-02-11 ENCOUNTER — Encounter: Payer: Self-pay | Admitting: Advanced Practice Midwife

## 2019-02-11 ENCOUNTER — Other Ambulatory Visit: Payer: Self-pay

## 2019-02-11 VITALS — BP 125/71 | HR 99 | Wt 211.0 lb

## 2019-02-11 DIAGNOSIS — Z1389 Encounter for screening for other disorder: Secondary | ICD-10-CM

## 2019-02-11 DIAGNOSIS — Z3490 Encounter for supervision of normal pregnancy, unspecified, unspecified trimester: Secondary | ICD-10-CM | POA: Insufficient documentation

## 2019-02-11 DIAGNOSIS — Z3481 Encounter for supervision of other normal pregnancy, first trimester: Secondary | ICD-10-CM | POA: Diagnosis not present

## 2019-02-11 DIAGNOSIS — Z331 Pregnant state, incidental: Secondary | ICD-10-CM | POA: Diagnosis not present

## 2019-02-11 DIAGNOSIS — Z3A13 13 weeks gestation of pregnancy: Secondary | ICD-10-CM

## 2019-02-11 DIAGNOSIS — Z363 Encounter for antenatal screening for malformations: Secondary | ICD-10-CM

## 2019-02-11 DIAGNOSIS — Z0283 Encounter for blood-alcohol and blood-drug test: Secondary | ICD-10-CM

## 2019-02-11 LAB — OBSTETRIC PANEL, INCLUDING HIV
Antibody Screen: NEGATIVE
Basophils Absolute: 0 10*3/uL (ref 0.0–0.2)
Basos: 0 %
EOS (ABSOLUTE): 0 10*3/uL (ref 0.0–0.4)
Eos: 1 %
HIV Screen 4th Generation wRfx: NONREACTIVE
Hematocrit: 39.5 % (ref 34.0–46.6)
Hemoglobin: 13.2 g/dL (ref 11.1–15.9)
Hepatitis B Surface Ag: NEGATIVE
Immature Grans (Abs): 0 10*3/uL (ref 0.0–0.1)
Immature Granulocytes: 0 %
Lymphocytes Absolute: 1.4 10*3/uL (ref 0.7–3.1)
Lymphs: 17 %
MCH: 30.6 pg (ref 26.6–33.0)
MCHC: 33.4 g/dL (ref 31.5–35.7)
MCV: 92 fL (ref 79–97)
Monocytes Absolute: 0.5 10*3/uL (ref 0.1–0.9)
Monocytes: 7 %
Neutrophils Absolute: 5.8 10*3/uL (ref 1.4–7.0)
Neutrophils: 75 %
Platelets: 218 10*3/uL (ref 150–450)
RBC: 4.31 x10E6/uL (ref 3.77–5.28)
RDW: 12.7 % (ref 11.7–15.4)
RPR Ser Ql: NONREACTIVE
Rh Factor: POSITIVE
Rubella Antibodies, IGG: 5.97 index (ref >0.99)
WBC: 7.8 10*3/uL (ref 3.4–10.8)

## 2019-02-11 LAB — MATERNIT 21 PLUS CORE, BLOOD
Fetal Fraction: 7
Result (T21): NEGATIVE
Trisomy 13 (Patau syndrome): NEGATIVE
Trisomy 18 (Edwards syndrome): NEGATIVE
Trisomy 21 (Down syndrome): NEGATIVE

## 2019-02-11 LAB — INTEGRATED 1
Crown Rump Length: 70.9 mm
Gest. Age on Collection Date: 13.1 weeks
Maternal Age at EDD: 24 yr
Nuchal Translucency (NT): 1.6 mm
Number of Fetuses: 1
PAPP-A Value: 1742.2 ng/mL
Weight: 211 [lb_av]

## 2019-02-11 MED ORDER — BLOOD PRESSURE MONITOR MISC: 0 refills | Status: DC

## 2019-02-11 NOTE — Progress Notes (Signed)
INITIAL OBSTETRICAL VISIT Patient name: Jody Hansen MRN [id number]  Date of birth: 1995/08/06 Chief Complaint:   Initial Prenatal Visit  History of Present Illness:   AUBRA Hansen is a 23 y.o. G78P1011 Caucasian female at [redacted]w[redacted]d by 5wk u/s, with an Estimated Date of Delivery: 08/17/19 being seen today for her initial obstetrical visit.   Her obstetrical history is significant for term uncomplicated SVB x 1, SAB x1.   Today she reports some n/v- meds make her sleepy. Feels excessively sleepy all the time, even when not taking nausea meds, sometimes feels she is dozing off while driving. Sleeps well at night.  Patient's last menstrual period was 11/03/2018 (exact date). Last pap 06/13/17. Results were: normal Review of Systems:   Pertinent items are noted in HPI Denies cramping/contractions, leakage of fluid, vaginal bleeding, abnormal vaginal discharge w/ itching/odor/irritation, headaches, visual changes, shortness of breath, chest pain, abdominal pain, severe nausea/vomiting, or problems with urination or bowel movements unless otherwise stated above.  Pertinent History Reviewed:  Reviewed past medical,surgical, social, obstetrical and family history.  Reviewed problem list, medications and allergies. OB History  Gravida Para Term Preterm AB Living  3 1 1   1 1   SAB TAB Ectopic Multiple Live Births  1     0 1    # Outcome Date GA Lbr Len/2nd Weight Sex Delivery Anes PTL Lv  3 Current           2 SAB 08/2018          1 Term 09/19/15 [redacted]w[redacted]d 21:20 / 00:46 7 lb 9.5 oz (3.445 kg) F Vag-Spont EPI N LIV   Physical Assessment:   Vitals:   02/11/19 1038  BP: 125/71  Pulse: 99  Weight: 211 lb (95.7 kg)  Body mass index is 38.59 kg/m.       Physical Examination:  General appearance - well appearing, and in no distress  Mental status - alert, oriented to person, place, and time  Psych:  She has a normal mood and affect  Skin - warm and dry, normal color, no suspicious lesions  noted  Chest - effort normal, all lung fields clear to auscultation bilaterally  Heart - normal rate and regular rhythm  Abdomen - soft, nontender  Extremities:  No swelling or varicosities noted  Pelvic - VULVA: normal appearing vulva with no masses, tenderness or lesions  VAGINA: normal appearing vagina with normal color and discharge, no lesions  CERVIX: normal appearing cervix without discharge or lesions, no CMT  NT from 02/05/19:  [place] 12+3 wks,measurements c/w dates,fhr 170 bpm,crl 70.95 mm,anterior placenta,normal ovaries,NB present,NT 1.6 mm  No results found for this or any previous visit (from the past 24 hour(s)).  Assessment & Plan:  1) Low-Risk Pregnancy G3P1011 at [redacted]w[redacted]d with an Estimated Date of Delivery: 08/17/19   2) Initial OB visit  3) Excessive sleepiness> hgb normal from 12/1, discussed getting TSH, wants to wait til next visit. Ride w/ windows cracked, radio up if needs to   Meds:  Meds ordered this encounter  Medications  . Blood Pressure Monitor MISC    Sig: For regular home bp monitoring during pregnancy    Dispense:  1 each    Refill:  0    Z34.90    Initial labs obtained Continue prenatal vitamins Reviewed n/v relief measures and warning s/s to report Reviewed recommended weight gain based on pre-gravid BMI Encouraged well-balanced diet Genetic Screening discussed: requested nt/it, maternit21, had both 12/1 Cystic fibrosis,  SMA, Fragile X screening discussed declined Ultrasound discussed; fetal survey: requested CCNC completed>PCM not here, form faxed The nature of [place] for [employer] with multiple MDs and other Advanced Practice Providers was explained to patient; also emphasized that fellows, residents, and students are part of our team. Does not have home bp cuff. Rx faxed to [facility]. Check bp weekly, let us know if >140/90.   Follow-up: Return in about 5 weeks (around 03/18/2019) for LROB, 2nd IT, [id number], in person, CNM.    Orders Placed This Encounter  Procedures  . Urine Culture  . US OB Comp + 14 Wk  . Urinalysis, Routine w reflex microscopic  . Pain Management Screening Profile (10S)  . POC Urinalysis Dipstick OB    Roma Schanz CNM, [facility] 02/11/2019 11:51 AM

## 2019-02-11 NOTE — Patient Instructions (Signed)
Jody Hansen, I greatly value your feedback.  If you receive a survey following your visit with [place] today, we appreciate you taking the time to fill it out.  Thanks, [name], CNM, [facility]  [place]!!! It is now [place] at [facility] ([place], [place] [address]) Entrance located off of [address] parking   Nausea & Vomiting  Have saltine crackers or pretzels by your bed and eat a few bites before you raise your head out of bed in the morning  Eat small frequent meals throughout the day instead of large meals  Drink plenty of fluids throughout the day to stay hydrated, just don't drink a lot of fluids with your meals.  This can make your stomach fill up faster making you feel sick  Do not brush your teeth right after you eat  Products with real ginger are good for nausea, like ginger ale and ginger hard candy Make sure it says made with real ginger!  Sucking on sour candy like lemon heads is also good for nausea  If your prenatal vitamins make you nauseated, take them at night so you will sleep through the nausea  Sea Bands  If you feel like you need medicine for the nausea & vomiting please let us know  If you are unable to keep any fluids or food down please let us know   Constipation  Drink plenty of fluid, preferably water, throughout the day  Eat foods high in fiber such as fruits, vegetables, and grains  Exercise, such as walking, is a good way to keep your bowels regular  Drink warm fluids, especially warm prune juice, or decaf coffee  Eat a 1/2 cup of real oatmeal (not instant), 1/2 cup applesauce, and 1/2-1 cup warm prune juice every day  If needed, you may take Colace (docusate sodium) stool softener once or twice a day to help keep the stool soft.   If you still are having problems with constipation, you may take Miralax once daily as needed to help keep your bowels regular.   Home  Blood Pressure Monitoring for Patients   Your provider has recommended that you check your blood pressure (BP) at least once a week at home. If you do not have a blood pressure cuff at home, one will be provided for you. Contact your provider if you have not received your monitor within 1 week.   Helpful Tips for Accurate Home Blood Pressure Checks  . Don't smoke, exercise, or drink caffeine 30 minutes before checking your BP . Use the restroom before checking your BP (a full bladder can raise your pressure) . Relax in a comfortable upright chair . Feet on the ground . Left arm resting comfortably on a flat surface at the level of your heart . Legs uncrossed . Back supported . Sit quietly and don't talk . Place the cuff on your bare arm . Adjust snuggly, so that only two fingertips can fit between your skin and the top of the cuff . Check 2 readings separated by at least one minute . Keep a log of your BP readings . For a visual, please reference this diagram: http://ccnc.care/bpdiagram  Provider Name: [facility]     Phone: [phone]  Zone 1: ALL CLEAR  Continue to monitor your symptoms:  . BP reading is less than 140 (top number) or less than 90 (bottom number)  . No right upper stomach  pain . No headaches or seeing spots . No feeling nauseated or throwing up . No swelling in face and hands  Zone 2: CAUTION Call your doctor's office for any of the following:  . BP reading is greater than 140 (top number) or greater than 90 (bottom number)  . Stomach pain under your ribs in the middle or right side . Headaches or seeing spots . Feeling nauseated or throwing up . Swelling in face and hands  Zone 3: EMERGENCY  Seek immediate medical care if you have any of the following:  . BP reading is greater than160 (top number) or greater than 110 (bottom number) . Severe headaches not improving with Tylenol . Serious difficulty catching your breath . Any worsening symptoms  from Zone 2    First Trimester of Pregnancy The first trimester of pregnancy is from week 1 until the end of week 12 (months 1 through 3). A week after a sperm fertilizes an egg, the egg will implant on the wall of the uterus. This embryo will begin to develop into a baby. Genes from you and your partner are forming the baby. The female genes determine whether the baby is a boy or a girl. At 6-8 weeks, the eyes and face are formed, and the heartbeat can be seen on ultrasound. At the end of 12 weeks, all the baby's organs are formed.  Now that you are pregnant, you will want to do everything you can to have a healthy baby. Two of the most important things are to get good prenatal care and to follow your health care provider's instructions. Prenatal care is all the medical care you receive before the baby's birth. This care will help prevent, find, and treat any problems during the pregnancy and childbirth. BODY CHANGES Your body goes through many changes during pregnancy. The changes vary from woman to woman.   You may gain or lose a couple of pounds at first.  You may feel sick to your stomach (nauseous) and throw up (vomit). If the vomiting is uncontrollable, call your health care provider.  You may tire easily.  You may develop headaches that can be relieved by medicines approved by your health care provider.  You may urinate more often. Painful urination may mean you have a bladder infection.  You may develop heartburn as a result of your pregnancy.  You may develop constipation because certain hormones are causing the muscles that push waste through your intestines to slow down.  You may develop hemorrhoids or swollen, bulging veins (varicose veins).  Your breasts may begin to grow larger and become tender. Your nipples may stick out more, and the tissue that surrounds them (areola) may become darker.  Your gums may bleed and may be sensitive to brushing and flossing.  Dark spots or  blotches (chloasma, mask of pregnancy) may develop on your face. This will likely fade after the baby is born.  Your menstrual periods will stop.  You may have a loss of appetite.  You may develop cravings for certain kinds of food.  You may have changes in your emotions from day to day, such as being excited to be pregnant or being concerned that something may go wrong with the pregnancy and baby.  You may have more vivid and strange dreams.  You may have changes in your hair. These can include thickening of your hair, rapid growth, and changes in texture. Some women also have hair loss during or after pregnancy, or hair that  feels dry or thin. Your hair will most likely return to normal after your baby is born. WHAT TO EXPECT AT YOUR PRENATAL VISITS During a routine prenatal visit:  You will be weighed to make sure you and the baby are growing normally.  Your blood pressure will be taken.  Your abdomen will be measured to track your baby's growth.  The fetal heartbeat will be listened to starting around week 10 or 12 of your pregnancy.  Test results from any previous visits will be discussed. Your health care provider may ask you:  How you are feeling.  If you are feeling the baby move.  If you have had any abnormal symptoms, such as leaking fluid, bleeding, severe headaches, or abdominal cramping.  If you have any questions. Other tests that may be performed during your first trimester include:  Blood tests to find your blood type and to check for the presence of any previous infections. They will also be used to check for low iron levels (anemia) and Rh antibodies. Later in the pregnancy, blood tests for diabetes will be done along with other tests if problems develop.  Urine tests to check for infections, diabetes, or protein in the urine.  An ultrasound to confirm the proper growth and development of the baby.  An amniocentesis to check for possible genetic problems.   Fetal screens for spina bifida and Down syndrome.  You may need other tests to make sure you and the baby are doing well. HOME CARE INSTRUCTIONS  Medicines  Follow your health care provider's instructions regarding medicine use. Specific medicines may be either safe or unsafe to take during pregnancy.  Take your prenatal vitamins as directed.  If you develop constipation, try taking a stool softener if your health care provider approves. Diet  Eat regular, well-balanced meals. Choose a variety of foods, such as meat or vegetable-based protein, fish, milk and low-fat dairy products, vegetables, fruits, and whole grain breads and cereals. Your health care provider will help you determine the amount of weight gain that is right for you.  Avoid raw meat and uncooked cheese. These carry germs that can cause birth defects in the baby.  Eating four or five small meals rather than three large meals a day may help relieve nausea and vomiting. If you start to feel nauseous, eating a few soda crackers can be helpful. Drinking liquids between meals instead of during meals also seems to help nausea and vomiting.  If you develop constipation, eat more high-fiber foods, such as fresh vegetables or fruit and whole grains. Drink enough fluids to keep your urine clear or pale yellow. Activity and Exercise  Exercise only as directed by your health care provider. Exercising will help you:  Control your weight.  Stay in shape.  Be prepared for labor and delivery.  Experiencing pain or cramping in the lower abdomen or low back is a good sign that you should stop exercising. Check with your health care provider before continuing normal exercises.  Try to avoid standing for long periods of time. Move your legs often if you must stand in one place for a long time.  Avoid heavy lifting.  Wear low-heeled shoes, and practice good posture.  You may continue to have sex unless your health care provider  directs you otherwise. Relief of Pain or Discomfort  Wear a good support bra for breast tenderness.    Take warm sitz baths to soothe any pain or discomfort caused by hemorrhoids. Use hemorrhoid cream  if your health care provider approves.    Rest with your legs elevated if you have leg cramps or low back pain.  If you develop varicose veins in your legs, wear support hose. Elevate your feet for 15 minutes, 3-4 times a day. Limit salt in your diet. Prenatal Care  Schedule your prenatal visits by the twelfth week of pregnancy. They are usually scheduled monthly at first, then more often in the last 2 months before delivery.  Write down your questions. Take them to your prenatal visits.  Keep all your prenatal visits as directed by your health care provider. Safety  Wear your seat belt at all times when driving.  Make a list of emergency phone numbers, including numbers for family, friends, the hospital, and police and fire departments. General Tips  Ask your health care provider for a referral to a local prenatal education class. Begin classes no later than at the beginning of month 6 of your pregnancy.  Ask for help if you have counseling or nutritional needs during pregnancy. Your health care provider can offer advice or refer you to specialists for help with various needs.  Do not use hot tubs, steam rooms, or saunas.  Do not douche or use tampons or scented sanitary pads.  Do not cross your legs for long periods of time.  Avoid cat litter boxes and soil used by cats. These carry germs that can cause birth defects in the baby and possibly loss of the fetus by miscarriage or stillbirth.  Avoid all smoking, herbs, alcohol, and medicines not prescribed by your health care provider. Chemicals in these affect the formation and growth of the baby.  Schedule a dentist appointment. At home, brush your teeth with a soft toothbrush and be gentle when you floss. SEEK MEDICAL CARE IF:    You have dizziness.  You have mild pelvic cramps, pelvic pressure, or nagging pain in the abdominal area.  You have persistent nausea, vomiting, or diarrhea.  You have a bad smelling vaginal discharge.  You have pain with urination.  You notice increased swelling in your face, hands, legs, or ankles. SEEK IMMEDIATE MEDICAL CARE IF:   You have a fever.  You are leaking fluid from your vagina.  You have spotting or bleeding from your vagina.  You have severe abdominal cramping or pain.  You have rapid weight gain or loss.  You vomit blood or material that looks like coffee grounds.  You are exposed to [place] measles and have never had them.  You are exposed to fifth disease or chickenpox.  You develop a severe headache.  You have shortness of breath.  You have any kind of trauma, such as from a fall or a car accident. Document Released: 02/15/2001 Document Revised: 07/08/2013 Document Reviewed: 01/01/2013 [employer] Patient Information 2015 [place], [place]. This information is not intended to replace advice given to you by your health care provider. Make sure you discuss any questions you have with your health care provider.  Coronavirus (COVID-19) Are you at risk?  Are you at risk for the Coronavirus (COVID-19)?  To be considered HIGH RISK for Coronavirus (COVID-19), you have to meet the following criteria:  . Traveled to [place], [place], [place], [place] or [place]; or in the [place] to [place], [place], [place], or [place]; and have fever, cough, and shortness of breath within the last 2 weeks of travel OR . Been in close contact with a person diagnosed with COVID-19 within the last 2  weeks and have fever, cough, and shortness of breath . IF YOU DO NOT MEET THESE CRITERIA, YOU ARE CONSIDERED LOW RISK FOR COVID-19.  What to do if you are HIGH RISK for COVID-19?  Marland Kitchen If you are having a medical emergency, call 911. . Seek medical care right away. Before  you go to a doctor's office, urgent care or emergency department, call ahead and tell them about your recent travel, contact with someone diagnosed with COVID-19, and your symptoms. You should receive instructions from your physician's office regarding next steps of care.  . When you arrive at healthcare provider, tell the healthcare staff immediately you have returned from visiting [place], [place], [place], [place] or [place]; or traveled in the [place] to [place], [place], [place], or [place]; in the last two weeks or you have been in close contact with a person diagnosed with COVID-19 in the last 2 weeks.   . Tell the health care staff about your symptoms: fever, cough and shortness of breath. . After you have been seen by a medical provider, you will be either: o Tested for (COVID-19) and discharged home on quarantine except to seek medical care if symptoms worsen, and asked to  - Stay home and avoid contact with others until you get your results (4-5 days)  - Avoid travel on public transportation if possible (such as bus, train, or airplane) or o Sent to the Emergency Department by EMS for evaluation, COVID-19 testing, and possible admission depending on your condition and test results.  What to do if you are LOW RISK for COVID-19?  Reduce your risk of any infection by using the same precautions used for avoiding the common cold or flu:  Marland Kitchen Wash your hands often with soap and warm water for at least 20 seconds.  If soap and water are not readily available, use an alcohol-based hand sanitizer with at least 60% alcohol.  . If coughing or sneezing, cover your mouth and nose by coughing or sneezing into the elbow areas of your shirt or coat, into a tissue or into your sleeve (not your hands). . Avoid shaking hands with others and consider head nods or verbal greetings only. . Avoid touching your eyes, nose, or mouth with unwashed hands.  . Avoid close contact with people who are sick. .  Avoid places or events with large numbers of people in one location, like concerts or sporting events. . Carefully consider travel plans you have or are making. . If you are planning any travel outside or inside the [place], visit the CDC's Travelers' Health webpage for the latest health notices. . If you have some symptoms but not all symptoms, continue to monitor at home and seek medical attention if your symptoms worsen. . If you are having a medical emergency, call 911.   [place] / e-Visit: [email or web address]         [facility]: [facility]: [id number]                   [facility]: [phone]

## 2019-02-12 LAB — URINALYSIS, ROUTINE W REFLEX MICROSCOPIC
Bilirubin, UA: NEGATIVE
Glucose, UA: NEGATIVE
Ketones, UA: NEGATIVE
Leukocytes,UA: NEGATIVE
Nitrite, UA: NEGATIVE
Protein,UA: NEGATIVE
RBC, UA: NEGATIVE
Specific Gravity, UA: 1.026 (ref 1.005–1.030)
Urobilinogen, Ur: 0.2 mg/dL (ref 0.2–1.0)
pH, UA: 6.5 (ref 5.0–7.5)

## 2019-02-12 LAB — SPECIMEN STATUS REPORT

## 2019-02-13 LAB — URINE CULTURE

## 2019-02-13 LAB — PMP SCREEN PROFILE (10S), URINE
Amphetamine Scrn, Ur: NEGATIVE ng/mL
BARBITURATE SCREEN URINE: NEGATIVE ng/mL
BENZODIAZEPINE SCREEN, URINE: NEGATIVE ng/mL
CANNABINOIDS UR QL SCN: NEGATIVE ng/mL
Cocaine (Metab) Scrn, Ur: NEGATIVE ng/mL
Creatinine(Crt), U: 181.2 mg/dL (ref 20.0–300.0)
Methadone Screen, Urine: NEGATIVE ng/mL
OXYCODONE+OXYMORPHONE UR QL SCN: NEGATIVE ng/mL
Opiate Scrn, Ur: NEGATIVE ng/mL
Ph of Urine: 6.3 (ref 4.5–8.9)
Phencyclidine Qn, Ur: NEGATIVE ng/mL
Propoxyphene Scrn, Ur: NEGATIVE ng/mL

## 2019-02-13 LAB — SPECIMEN STATUS REPORT

## 2019-02-18 ENCOUNTER — Encounter: Payer: Self-pay | Admitting: Advanced Practice Midwife

## 2019-02-20 DIAGNOSIS — Z3490 Encounter for supervision of normal pregnancy, unspecified, unspecified trimester: Secondary | ICD-10-CM | POA: Diagnosis not present

## 2019-02-26 ENCOUNTER — Encounter: Payer: Self-pay | Admitting: Advanced Practice Midwife

## 2019-02-27 ENCOUNTER — Other Ambulatory Visit: Payer: Self-pay

## 2019-02-27 ENCOUNTER — Other Ambulatory Visit (HOSPITAL_COMMUNITY)
Admission: RE | Admit: 2019-02-27 | Discharge: 2019-02-27 | Disposition: A | Discharge status: Home / Self Care | Payer: Medicaid Other | Source: Ambulatory Visit | Attending: Advanced Practice Midwife | Admitting: Advanced Practice Midwife

## 2019-02-27 ENCOUNTER — Ambulatory Visit (INDEPENDENT_AMBULATORY_CARE_PROVIDER_SITE_OTHER): Payer: Medicaid Other | Admitting: Advanced Practice Midwife

## 2019-02-27 ENCOUNTER — Encounter: Payer: Self-pay | Admitting: Advanced Practice Midwife

## 2019-02-27 VITALS — BP 123/66 | HR 86 | Wt 213.0 lb

## 2019-02-27 DIAGNOSIS — Z1389 Encounter for screening for other disorder: Secondary | ICD-10-CM | POA: Diagnosis not present

## 2019-02-27 DIAGNOSIS — N898 Other specified noninflammatory disorders of vagina: Secondary | ICD-10-CM

## 2019-02-27 DIAGNOSIS — Z331 Pregnant state, incidental: Secondary | ICD-10-CM

## 2019-02-27 DIAGNOSIS — Z3482 Encounter for supervision of other normal pregnancy, second trimester: Secondary | ICD-10-CM

## 2019-02-27 DIAGNOSIS — O26892 Other specified pregnancy related conditions, second trimester: Secondary | ICD-10-CM

## 2019-02-27 DIAGNOSIS — Z3A15 15 weeks gestation of pregnancy: Secondary | ICD-10-CM

## 2019-02-27 LAB — POCT URINALYSIS DIPSTICK OB
Blood, UA: NEGATIVE
Glucose, UA: NEGATIVE
Ketones, UA: NEGATIVE
Leukocytes, UA: NEGATIVE
Nitrite, UA: NEGATIVE
POC,PROTEIN,UA: NEGATIVE

## 2019-02-27 LAB — POCT WET PREP (WET MOUNT)
Clue Cells Wet Prep Whiff POC: NEGATIVE
Trichomonas Wet Prep HPF POC: ABSENT

## 2019-02-27 NOTE — Progress Notes (Signed)
LOW-RISK PREGNANCY VISIT Patient name: Jody Hansen MRN [id number]  Date of birth: 1996-02-10 Chief Complaint:   Vaginal Discharge (? UTI)  History of Present Illness:   Jody Hansen is a 23 y.o. G29P1011 female at [redacted]w[redacted]d with an Estimated Date of Delivery: 08/17/19 being seen today for ongoing management of a low-risk pregnancy.  Today she reports vaginal irritation. Contractions: Not present.  .  Movement: Absent. denies leaking of fluid. Review of Systems:   Pertinent items are noted in HPI Denies abnormal vaginal discharge w/ itching/odor/irritation, headaches, visual changes, shortness of breath, chest pain, abdominal pain, severe nausea/vomiting, or problems with urination or bowel movements unless otherwise stated above. Pertinent History Reviewed:  Reviewed past medical,surgical, social, obstetrical and family history.  Reviewed problem list, medications and allergies. Physical Assessment:   Vitals:   02/27/19 1515  BP: 123/66  Pulse: 86  Weight: 213 lb (96.6 kg)  Body mass index is 38.96 kg/m.        Physical Examination:   General appearance: Well appearing, and in no distress  Mental status: Alert, oriented to person, place, and time  Skin: Warm & dry  Cardiovascular: Normal heart rate noted  Respiratory: Normal respiratory effort, no distress  Abdomen: Soft, gravid, nontender  Pelvic: Cervical exam deferred         Extremities: Edema: None  Fetal Status:     Movement: Absent    Results for orders placed or performed in visit on 02/27/19 (from the past 24 hour(s))  POC Urinalysis Dipstick OB   Collection Time: 02/27/19  3:26 PM  Result Value Ref Range   Color, UA     Clarity, UA     Glucose, UA Negative Negative   Bilirubin, UA     Ketones, UA neg    Spec Grav, UA     Blood, UA neg    pH, UA     POC,PROTEIN,UA Negative Negative, Trace, Small (1+), Moderate (2+), Large (3+), 4+   Urobilinogen, UA     Nitrite, UA neg    Leukocytes, UA Negative  Negative   Appearance     Odor    POCT Wet Prep [facility])   Collection Time: 02/27/19  3:57 PM  Result Value Ref Range   Source Wet Prep POC vag    WBC, Wet Prep HPF POC few    Bacteria Wet Prep HPF POC None (A) Few   BACTERIA WET PREP MORPHOLOGY POC     Clue Cells Wet Prep HPF POC None None   Clue Cells Wet Prep Whiff POC Negative Whiff    Yeast Wet Prep HPF POC None None   KOH Wet Prep POC     Trichomonas Wet Prep HPF POC Absent Absent    Assessment & Plan:  1) Low-risk pregnancy G3P1011 at [redacted]w[redacted]d with an Estimated Date of Delivery: 08/17/19   2) Vaginal d/c, neg wet prep; given hygiene measures (cotton panties, Dove); GC/chlam collected; urine culture collected (missed at NOB)   Meds: No orders of the defined types were placed in this encounter.  Labs/procedures today: wet prep, GC/chlam, urine culture  Plan:  Continue routine obstetrical care   Reviewed: Preterm labor symptoms and general obstetric precautions including but not limited to vaginal bleeding, contractions, leaking of fluid and fetal movement were reviewed in detail with the patient.  All questions were answered.  Follow-up: Return for keep next visit/ultrasound as scheduled.  Orders Placed This Encounter  Procedures  . Urine Culture  . POC  Urinalysis Dipstick OB  . POCT Wet Prep [facility])   Myrtis Ser CNM 02/27/2019 4:00 PM

## 2019-03-01 LAB — URINE CULTURE

## 2019-03-05 LAB — CERVICOVAGINAL ANCILLARY ONLY
Chlamydia: NEGATIVE
Comment: NEGATIVE
Comment: NORMAL
Neisseria Gonorrhea: NEGATIVE

## 2019-03-08 NOTE — L&D Delivery Note (Addendum)
Patient is 24 y.o. [id number] [redacted]w[redacted]d admitted for SOL, hx of obesity.   Delivery Note At 7:23 PM a viable female was delivered via Vaginal, Vacuum (Extractor) (Presentation: Left Occiput Anterior).  APGAR: 5, 9; weight pending.   Placenta status: Spontaneous, Intact.  Cord: 3 vessels with the following complications: None.  Cord pH: pending  Anesthesia: Epidural Episiotomy: None Lacerations: Periurethral, hemostatic  Suture Repair:  none Est. Blood Loss (mL): 150 cc  Mom to postpartum.  Baby to Couplet care / Skin to Skin.  Upon arrival patient was complete and began to push with the labor team in the room. She pushed with good maternal effort without significant change in station and recurrent prolonged decelerations.  Due to minimal change in station and recurrent decelerations, Dr. Glo Herring was called to consider a vacuum delivery. The risks and benefits of a vacuum delivery were discussed with the patient by Dr. Glo Herring and she agreed with a vacuum delivery attempt. A Mighty Vac was applied to the fetal scalp immediately anterior to the posterior fontenelle.   Ms. Shimp continued to push with good maternal effort and showed improvement in station with the use of the vacuum assist. Ultimately a baby boy was successfully delivered. SD was identified. First maneuver: [place] Second maneuver: [place] Total time of dystocia: 1 min At no time was traction placed on fetal head.  No delayed cord clamping performed. Baby was immediately taken to the warmer to be assessed by the NICU team due to poor tone. Placenta delivered intact with 3V cord. Vaginal canal and perineum was inspected and found to have one periurethral hemostatic laceration and and an intact perineum. Pitocin was started and uterus massaged until bleeding slowed. Counts of sharps, instruments, and lap pads were all correct.     Matilde Haymaker, MD PGY-2 5/31/20217:42 PM  Midwife attestation: I was gloved and present for delivery  in its entirety and I agree with the above resident's note.  Julianne Handler, CNM 7:57 PM

## 2019-03-12 ENCOUNTER — Encounter: Payer: Self-pay | Admitting: Advanced Practice Midwife

## 2019-03-15 ENCOUNTER — Encounter: Payer: Self-pay | Admitting: Advanced Practice Midwife

## 2019-03-18 ENCOUNTER — Ambulatory Visit (INDEPENDENT_AMBULATORY_CARE_PROVIDER_SITE_OTHER): Payer: Medicaid Other | Admitting: Advanced Practice Midwife

## 2019-03-18 ENCOUNTER — Other Ambulatory Visit: Payer: Self-pay

## 2019-03-18 ENCOUNTER — Ambulatory Visit (INDEPENDENT_AMBULATORY_CARE_PROVIDER_SITE_OTHER): Payer: Medicaid Other

## 2019-03-18 VITALS — BP 101/62 | HR 87 | Wt 215.0 lb

## 2019-03-18 DIAGNOSIS — R5382 Chronic fatigue, unspecified: Secondary | ICD-10-CM | POA: Diagnosis not present

## 2019-03-18 DIAGNOSIS — Z3A18 18 weeks gestation of pregnancy: Secondary | ICD-10-CM | POA: Diagnosis not present

## 2019-03-18 DIAGNOSIS — Z3481 Encounter for supervision of other normal pregnancy, first trimester: Secondary | ICD-10-CM

## 2019-03-18 DIAGNOSIS — Z1379 Encounter for other screening for genetic and chromosomal anomalies: Secondary | ICD-10-CM

## 2019-03-18 DIAGNOSIS — Z331 Pregnant state, incidental: Secondary | ICD-10-CM

## 2019-03-18 DIAGNOSIS — Z3482 Encounter for supervision of other normal pregnancy, second trimester: Secondary | ICD-10-CM

## 2019-03-18 DIAGNOSIS — Z1389 Encounter for screening for other disorder: Secondary | ICD-10-CM

## 2019-03-18 DIAGNOSIS — Z363 Encounter for antenatal screening for malformations: Secondary | ICD-10-CM | POA: Diagnosis not present

## 2019-03-18 LAB — POCT URINALYSIS DIPSTICK OB
Blood, UA: NEGATIVE
Glucose, UA: NEGATIVE
Ketones, UA: NEGATIVE
Leukocytes, UA: NEGATIVE
Nitrite, UA: NEGATIVE
POC,PROTEIN,UA: NEGATIVE

## 2019-03-18 NOTE — Patient Instructions (Signed)
Sciatica Rehab Ask your health care provider which exercises are safe for you. Do exercises exactly as told by your health care provider and adjust them as directed. It is normal to feel mild stretching, pulling, tightness, or discomfort as you do these exercises, but you should stop right away if you feel sudden pain or your pain gets worse.Do not begin these exercises until told by your health care provider. Stretching and range of motion exercises These exercises warm up your muscles and joints and improve the movement and flexibility of your hips and your back. These exercises also help to relieve pain, numbness, and tingling. Exercise A: Sciatic nerve glide  1. Sit in a chair with your head facing down toward your chest. Place your hands behind your back. Let your shoulders slump forward. 2. Slowly straighten one of your knees while you tilt your head back as if you are looking toward the ceiling. Only straighten your leg as far as you can without making your symptoms worse. 3. Hold for __________ seconds. 4. Slowly return to the starting position. 5. Repeat with your other leg. Repeat __________ times. Complete this exercise __________ times a day. Exercise B: Knee to chest with hip adduction and internal rotation    1. Lie on your back on a firm surface with both legs straight. 2. Bend one of your knees and move it up toward your chest until you feel a gentle stretch in your lower back and buttock. Then, move your knee toward the shoulder that is on the opposite side from your leg. ? Hold your leg in this position by holding onto the front of your knee. 3. Hold for __________ seconds. 4. Slowly return to the starting position. 5. Repeat with your other leg. Repeat __________ times. Complete this exercise __________ times a day. Exercise C: Prone extension on elbows    1. Lie on your abdomen on a firm surface. A bed may be too soft for this exercise. 2. Prop yourself up on your  elbows. 3. Use your arms to help lift your chest up until you feel a gentle stretch in your abdomen and your lower back. ? This will place some of your body weight on your elbows. If this is uncomfortable, try stacking pillows under your chest. ? Your hips should stay down, against the surface that you are lying on. Keep your hip and back muscles relaxed. 4. Hold for __________ seconds. 5. Slowly relax your upper body and return to the starting position. Repeat __________ times. Complete this exercise __________ times a day. Strengthening exercises These exercises build strength and endurance in your back. Endurance is the ability to use your muscles for a long time, even after they get tired. Exercise D: Pelvic tilt  1. Lie on your back on a firm surface. Bend your knees and keep your feet flat. 2. Tense your abdominal muscles. Tip your pelvis up toward the ceiling and flatten your lower back into the floor. ? To help with this exercise, you may place a small towel under your lower back and try to push your back into the towel. 3. Hold for __________ seconds. 4. Let your muscles relax completely before you repeat this exercise. Repeat __________ times. Complete this exercise __________ times a day. Exercise E: Alternating arm and leg raises    1. Get on your hands and knees on a firm surface. If you are on a hard floor, you may want to use padding to cushion your knees, such as  an exercise mat. 2. Line up your arms and legs. Your hands should be below your shoulders, and your knees should be below your hips. 3. Lift your left leg behind you. At the same time, raise your right arm and straighten it in front of you. ? Do not lift your leg higher than your hip. ? Do not lift your arm higher than your shoulder. ? Keep your abdominal and back muscles tight. ? Keep your hips facing the ground. ? Do not arch your back. ? Keep your balance carefully, and do not hold your breath. 4. Hold for  __________ seconds. 5. Slowly return to the starting position and repeat with your right leg and your left arm. Repeat __________ times. Complete this exercise __________ times a day. Posture and body mechanics    Body mechanics refers to the movements and positions of your body while you do your daily activities. Posture is part of body mechanics. Good posture and healthy body mechanics can help to relieve stress in your body's tissues and joints. Good posture means that your spine is in its natural S-curve position (your spine is neutral), your shoulders are pulled back slightly, and your head is not tipped forward. The following are general guidelines for applying improved posture and body mechanics to your everyday activities. Standing     When standing, keep your spine neutral and your feet about hip-width apart. Keep a slight bend in your knees. Your ears, shoulders, and hips should line up.  When you do a task in which you stand in one place for a long time, place one foot up on a stable object that is 2-4 inches (5-10 cm) high, such as a footstool. This helps keep your spine neutral. Sitting     When sitting, keep your spine neutral and keep your feet flat on the floor. Use a footrest, if necessary, and keep your thighs parallel to the floor. Avoid rounding your shoulders, and avoid tilting your head forward.  When working at a desk or a computer, keep your desk at a height where your hands are slightly lower than your elbows. Slide your chair under your desk so you are close enough to maintain good posture.  When working at a computer, place your monitor at a height where you are looking straight ahead and you do not have to tilt your head forward or downward to look at the screen. Resting     When lying down and resting, avoid positions that are most painful for you.  If you have pain with activities such as sitting, bending, stooping, or squatting (flexion-based  activities), lie in a position in which your body does not bend very much. For example, avoid curling up on your side with your arms and knees near your chest (fetal position).  If you have pain with activities such as standing for a long time or reaching with your arms (extension-based activities), lie with your spine in a neutral position and bend your knees slightly. Try the following positions: ? Lying on your side with a pillow between your knees. ? Lying on your back with a pillow under your knees. Lifting     When lifting objects, keep your feet at least shoulder-width apart and tighten your abdominal muscles.  Bend your knees and hips and keep your spine neutral. It is important to lift using the strength of your legs, not your back. Do not lock your knees straight out.  Always ask for help to lift heavy  or awkward objects. This information is not intended to replace advice given to you by your health care provider. Make sure you discuss any questions you have with your health care provider. Document Released: 02/21/2005 Document Revised: 10/29/2015 Document Reviewed: 11/07/2014 Elsevier Interactive Patient Education  2017 [employer].

## 2019-03-18 NOTE — Progress Notes (Signed)
   LOW-RISK PREGNANCY VISIT Patient name: Jody Hansen MRN [id number]  Date of birth: 18-Oct-1995 Chief Complaint:   Routine Prenatal Visit  History of Present Illness:   Jody Hansen is a 24 y.o. G66P1011 female at [redacted]w[redacted]d with an Estimated Date of Delivery: 08/17/19 being seen today for ongoing management of a low-risk pregnancy. Some sciatica and lower abdominal pain after moving around a lot.. Contractions: Not present. Vag. Bleeding: None.  Movement: Absent. denies leaking of fluid. Review of Systems:   Pertinent items are noted in HPI Denies abnormal vaginal discharge w/ itching/odor/irritation, headaches, visual changes, shortness of breath, chest pain, abdominal pain, severe nausea/vomiting, or problems with urination or bowel movements unless otherwise stated above. Pertinent History Reviewed:  Reviewed past medical,surgical, social, obstetrical and family history.  Reviewed problem list, medications and allergies. Physical Assessment:   Vitals:   03/18/19 1131  BP: 101/62  Pulse: 87  Weight: 215 lb (97.5 kg)  Body mass index is 39.32 kg/m.        Physical Examination:   General appearance: Well appearing, and in no distress  Mental status: Alert, oriented to person, place, and time  Skin: Warm & dry  Cardiovascular: Normal heart rate noted  Respiratory: Normal respiratory effort, no distress  Abdomen: Soft, gravid, nontender  Pelvic: Cervical exam deferred         Extremities: Edema: None  Fetal Status:     Movement: Absent   [place] 0000000 wks,cephalic,cx 5 cm,anterior placenta gr 0,normal ovaries bilat,svp of fluid 4.8 cm,fhr 159 bpm,two simple left choroid plexus cysts (#1) .6 x .2 mm,(#2) .3 x .4 mm,EFW 268 g 83%,anatomy complete  Chaperone: n/a    Results for orders placed or performed in visit on 03/18/19 (from the past 24 hour(s))  POC Urinalysis Dipstick OB   Collection Time: 03/18/19 11:41 AM  Result Value Ref Range   Color, UA     Clarity, UA     Glucose,  UA Negative Negative   Bilirubin, UA     Ketones, UA n    Spec Grav, UA     Blood, UA n    pH, UA     POC,PROTEIN,UA Negative Negative, Trace, Small (1+), Moderate (2+), Large (3+), 4+   Urobilinogen, UA     Nitrite, UA n    Leukocytes, UA Negative Negative   Appearance     Odor      Assessment & Plan:  1) Low-risk pregnancy G3P1011 at [redacted]w[redacted]d with an Estimated Date of Delivery: 08/17/19    Meds: No orders of the defined types were placed in this encounter.  Labs/procedures today: 2nd IT  Plan:  Continue routine obstetrical care  Next visit: prefers online    Reviewed: Preterm labor symptoms and general obstetric precautions including but not limited to vaginal bleeding, contractions, leaking of fluid and fetal movement were reviewed in detail with the patient.  All questions were answered. has home bp cuff. Check bp weekly, let us know if >140/90.   Follow-up: Return in about 4 weeks (around 04/15/2019) for [facility] Mychart visit.  Orders Placed This Encounter  Procedures  . INTEGRATED 2  . POC Urinalysis Dipstick OB   Christin Fudge DNP, CNM 03/18/2019 11:43 AM

## 2019-03-18 NOTE — Progress Notes (Signed)
[place] 0000000 wks,cephalic,cx 5 cm,anterior placenta gr 0,normal ovaries bilat,svp of fluid 4.8 cm,fhr 159 bpm,two simple left choroid plexus cysts (#1) .6 x .2 mm,(#2) .3 x .4 mm,EFW 268 g 83%,anatomy complete

## 2019-03-19 LAB — TSH: TSH: 2.07 u[IU]/mL (ref 0.450–4.500)

## 2019-03-20 LAB — INTEGRATED 2
AFP MoM: 1.71
Alpha-Fetoprotein: 61.7 ng/mL
Crown Rump Length: 70.9 mm
DIA MoM: 0.61
DIA Value: 86.3 pg/mL
Estriol, Unconjugated: 1.86 ng/mL
Gest. Age on Collection Date: 13.1 weeks
Gestational Age: 19 weeks
Maternal Age at EDD: 24 yr
Nuchal Translucency (NT): 1.6 mm
Nuchal Translucency MoM: 0.99
Number of Fetuses: 1
PAPP-A MoM: 2.27
PAPP-A Value: 1742.2 ng/mL
Test Results:: NEGATIVE
Weight: 211 [lb_av]
Weight: 211 [lb_av]
hCG MoM: 1.18
hCG Value: 21.4 IU/mL
uE3 MoM: 1.14

## 2019-04-13 ENCOUNTER — Emergency Department (HOSPITAL_COMMUNITY)
Admission: EM | Admit: 2019-04-13 | Discharge: 2019-04-13 | Disposition: A | Discharge status: Home / Self Care | Payer: Medicaid Other | Attending: Emergency Medicine | Admitting: Emergency Medicine

## 2019-04-13 ENCOUNTER — Encounter (HOSPITAL_COMMUNITY): Payer: Self-pay

## 2019-04-13 ENCOUNTER — Other Ambulatory Visit: Payer: Self-pay

## 2019-04-13 DIAGNOSIS — Y999 Unspecified external cause status: Secondary | ICD-10-CM | POA: Diagnosis not present

## 2019-04-13 DIAGNOSIS — O9A212 Injury, poisoning and certain other consequences of external causes complicating pregnancy, second trimester: Secondary | ICD-10-CM | POA: Insufficient documentation

## 2019-04-13 DIAGNOSIS — Z3A22 22 weeks gestation of pregnancy: Secondary | ICD-10-CM | POA: Insufficient documentation

## 2019-04-13 DIAGNOSIS — Y92410 Unspecified street and highway as the place of occurrence of the external cause: Secondary | ICD-10-CM | POA: Diagnosis not present

## 2019-04-13 DIAGNOSIS — Z79899 Other long term (current) drug therapy: Secondary | ICD-10-CM | POA: Diagnosis not present

## 2019-04-13 DIAGNOSIS — R1030 Lower abdominal pain, unspecified: Secondary | ICD-10-CM | POA: Insufficient documentation

## 2019-04-13 DIAGNOSIS — O26892 Other specified pregnancy related conditions, second trimester: Secondary | ICD-10-CM | POA: Diagnosis not present

## 2019-04-13 DIAGNOSIS — Y93I9 Activity, other involving external motion: Secondary | ICD-10-CM | POA: Diagnosis not present
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
## 2019-04-13 NOTE — Discharge Instructions (Addendum)
[facility] any vaginal bleeding, uterine contractions, or any other problems . Use Tylenol as directed for pain
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
## 2019-04-15 ENCOUNTER — Telehealth (INDEPENDENT_AMBULATORY_CARE_PROVIDER_SITE_OTHER): Payer: Medicaid Other | Admitting: Advanced Practice Midwife

## 2019-04-15 ENCOUNTER — Encounter: Payer: Self-pay | Admitting: Advanced Practice Midwife

## 2019-04-15 ENCOUNTER — Other Ambulatory Visit: Payer: Self-pay

## 2019-04-15 VITALS — BP 113/90 | HR 103 | Ht 62.0 in | Wt 215.0 lb

## 2019-04-15 DIAGNOSIS — Z3482 Encounter for supervision of other normal pregnancy, second trimester: Secondary | ICD-10-CM | POA: Diagnosis not present

## 2019-04-15 DIAGNOSIS — Z3A22 22 weeks gestation of pregnancy: Secondary | ICD-10-CM
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
## 2019-04-29 ENCOUNTER — Encounter: Payer: Self-pay | Admitting: Obstetrics and Gynecology

## 2019-04-29 NOTE — Progress Notes (Signed)
Note printed for pt to sit in chair at work.

## 2019-04-29 NOTE — Telephone Encounter (Signed)
I will print out a letter for her.

## 2019-04-30 ENCOUNTER — Telehealth: Payer: Self-pay | Admitting: *Deleted

## 2019-04-30 NOTE — Telephone Encounter (Signed)
Pt requesting refill on Zofran. Wanted to see if dose can be increased to 8mg ?

## 2019-05-01 ENCOUNTER — Other Ambulatory Visit: Payer: Self-pay | Admitting: Obstetrics and Gynecology

## 2019-05-01 MED ORDER — ONDANSETRON 8 MG PO TBDP: 4.0000 mg | ORAL_TABLET | Freq: Four times a day (QID) | ORAL | 0 refills | Status: DC | PRN

## 2019-05-01 NOTE — Progress Notes (Signed)
zofran 8 mg tabs escribed .

## 2019-05-06 ENCOUNTER — Encounter: Payer: Self-pay | Admitting: *Deleted

## 2019-05-13 ENCOUNTER — Other Ambulatory Visit: Payer: Self-pay

## 2019-05-13 ENCOUNTER — Ambulatory Visit (INDEPENDENT_AMBULATORY_CARE_PROVIDER_SITE_OTHER): Payer: Medicaid Other | Admitting: Women's Health

## 2019-05-13 ENCOUNTER — Encounter: Payer: Self-pay | Admitting: Women's Health

## 2019-05-13 ENCOUNTER — Other Ambulatory Visit: Payer: Medicaid Other

## 2019-05-13 VITALS — BP 105/69 | HR 88 | Wt 219.0 lb

## 2019-05-13 DIAGNOSIS — Z1389 Encounter for screening for other disorder: Secondary | ICD-10-CM

## 2019-05-13 DIAGNOSIS — Z131 Encounter for screening for diabetes mellitus: Secondary | ICD-10-CM

## 2019-05-13 DIAGNOSIS — Z331 Pregnant state, incidental: Secondary | ICD-10-CM

## 2019-05-13 DIAGNOSIS — Z3482 Encounter for supervision of other normal pregnancy, second trimester: Secondary | ICD-10-CM

## 2019-05-13 DIAGNOSIS — Z3A26 26 weeks gestation of pregnancy: Secondary | ICD-10-CM

## 2019-05-13 LAB — POCT URINALYSIS DIPSTICK OB
Blood, UA: NEGATIVE
Glucose, UA: NEGATIVE
Ketones, UA: NEGATIVE
Nitrite, UA: NEGATIVE
POC,PROTEIN,UA: NEGATIVE
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
## 2019-05-13 NOTE — Patient Instructions (Signed)
Jody Hansen, I greatly value your feedback.  If you receive a survey following your visit with [place] today, we appreciate you taking the time to fill it out.  Thanks, [name], CNM, [facility]  [place]!!! It is now [place] at [facility] ([place], [place] [address]) Entrance located off of [address]    Go to [email or web address] to register for FREE online childbirth classes   Call the office [phone]) or go to [facility] if:  You begin to have strong, frequent contractions  Your water breaks.  Sometimes it is a big gush of fluid, sometimes it is just a trickle that keeps getting your panties wet or running down your legs  You have vaginal bleeding.  It is normal to have a small amount of spotting if your cervix was checked.   You don't feel your baby moving like normal.  If you don't, get you something to eat and drink and lay down and focus on feeling your baby move.  You should feel at least 10 movements in 2 hours.  If you don't, you should call the office or go to [facility].    Tdap Vaccine  It is recommended that you get the Tdap vaccine during the third trimester of EACH pregnancy to help protect your baby from getting pertussis (whooping cough)  27-36 weeks is the BEST time to do this so that you can pass the protection on to your baby. During pregnancy is better than after pregnancy, but if you are unable to get it during pregnancy it will be offered at the hospital.   You can get this vaccine with [place], at the health department, your family doctor, or some local pharmacies  Everyone who will be around your baby should also be up-to-date on their vaccines before the baby comes. Adults (who are not pregnant) only need 1 dose of Tdap during adulthood.   [place] Pediatricians/Family Doctors:  [facility] [phone]                  [place] [phone] (usually not accepting new patients unless you have family there already, you are always welcome to call and ask)       [facility] Department [phone]       [facility] Pediatricians/Family Doctors:   [facility]: [phone]  [facility]: [phone]  [facility]: [place] Doctors:   [facility]: [facility]: [place]:  [place]: [phone]   Home Blood Pressure Monitoring for Patients   Your provider has recommended that you check your blood pressure (BP) at least once a week at home. If you do not have a blood pressure cuff at home, one will be provided for you. Contact your provider if you have not received your monitor within 1 week.   Helpful Tips for Accurate Home Blood Pressure Checks  . Don't smoke, exercise, or drink caffeine 30 minutes before checking your BP . Use the restroom before checking your BP (a full bladder can raise your pressure) . Relax in a comfortable upright chair . Feet on the ground . Left arm resting comfortably on a flat surface at the level of your heart . Legs uncrossed . Back supported . Sit quietly and don't  talk . Place the cuff on your bare arm . Adjust snuggly, so that only two fingertips can fit between your skin and the top of the cuff . Check 2 readings separated by at least one minute . Keep a log of your BP readings . For a visual, please reference this diagram: http://ccnc.care/bpdiagram  Provider Name: [facility]     Phone: [phone]  Zone 1: ALL CLEAR  Continue to monitor your symptoms:  . BP reading is less than 140 (top number) or less than 90 (bottom number)  . No right upper stomach pain . No headaches or seeing spots . No feeling nauseated or throwing up . No swelling in face and  hands  Zone 2: CAUTION Call your doctor's office for any of the following:  . BP reading is greater than 140 (top number) or greater than 90 (bottom number)  . Stomach pain under your ribs in the middle or right side . Headaches or seeing spots . Feeling nauseated or throwing up . Swelling in face and hands  Zone 3: EMERGENCY  Seek immediate medical care if you have any of the following:  . BP reading is greater than160 (top number) or greater than 110 (bottom number) . Severe headaches not improving with Tylenol . Serious difficulty catching your breath . Any worsening symptoms from Zone 2   Third Trimester of Pregnancy The third trimester is from week 29 through week 42, months 7 through 9. The third trimester is a time when the fetus is growing rapidly. At the end of the ninth month, the fetus is about 20 inches in length and weighs 6-10 pounds.  BODY CHANGES Your body goes through many changes during pregnancy. The changes vary from woman to woman.   Your weight will continue to increase. You can expect to gain 25-35 pounds (11-16 kg) by the end of the pregnancy.  You may begin to get stretch marks on your hips, abdomen, and breasts.  You may urinate more often because the fetus is moving lower into your pelvis and pressing on your bladder.  You may develop or continue to have heartburn as a result of your pregnancy.  You may develop constipation because certain hormones are causing the muscles that push waste through your intestines to slow down.  You may develop hemorrhoids or swollen, bulging veins (varicose veins).  You may have pelvic pain because of the weight gain and pregnancy hormones relaxing your joints between the bones in your pelvis. Backaches may result from overexertion of the muscles supporting your posture.  You may have changes in your hair. These can include thickening of your hair, rapid growth, and changes in texture. Some women also have hair loss during  or after pregnancy, or hair that feels dry or thin. Your hair will most likely return to normal after your baby is born.  Your breasts will continue to grow and be tender. A yellow discharge may leak from your breasts called colostrum.  Your belly button may stick out.  You may feel short of breath because of your expanding uterus.  You may notice the fetus "dropping," or moving lower in your abdomen.  You may have a bloody mucus discharge. This usually occurs a few days to a week before labor begins.  Your cervix becomes thin and soft (effaced) near your due date. WHAT TO EXPECT AT YOUR PRENATAL EXAMS  You will have prenatal exams every 2 weeks until week 36. Then, you will have weekly prenatal  exams. During a routine prenatal visit:  You will be weighed to make sure you and the fetus are growing normally.  Your blood pressure is taken.  Your abdomen will be measured to track your baby's growth.  The fetal heartbeat will be listened to.  Any test results from the previous visit will be discussed.  You may have a cervical check near your due date to see if you have effaced. At around 36 weeks, your caregiver will check your cervix. At the same time, your caregiver will also perform a test on the secretions of the vaginal tissue. This test is to determine if a type of bacteria, Group B streptococcus, is present. Your caregiver will explain this further. Your caregiver may ask you:  What your birth plan is.  How you are feeling.  If you are feeling the baby move.  If you have had any abnormal symptoms, such as leaking fluid, bleeding, severe headaches, or abdominal cramping.  If you have any questions. Other tests or screenings that may be performed during your third trimester include:  Blood tests that check for low iron levels (anemia).  Fetal testing to check the health, activity level, and growth of the fetus. Testing is done if you have certain medical conditions or if  there are problems during the pregnancy. FALSE LABOR You may feel small, irregular contractions that eventually go away. These are called Braxton Hicks contractions, or false labor. Contractions may last for hours, days, or even weeks before true labor sets in. If contractions come at regular intervals, intensify, or become painful, it is best to be seen by your caregiver.  SIGNS OF LABOR   Menstrual-like cramps.  Contractions that are 5 minutes apart or less.  Contractions that start on the top of the uterus and spread down to the lower abdomen and back.  A sense of increased pelvic pressure or back pain.  A watery or bloody mucus discharge that comes from the vagina. If you have any of these signs before the 37th week of pregnancy, call your caregiver right away. You need to go to the hospital to get checked immediately. HOME CARE INSTRUCTIONS   Avoid all smoking, herbs, alcohol, and unprescribed drugs. These chemicals affect the formation and growth of the baby.  Follow your caregiver's instructions regarding medicine use. There are medicines that are either safe or unsafe to take during pregnancy.  Exercise only as directed by your caregiver. Experiencing uterine cramps is a good sign to stop exercising.  Continue to eat regular, healthy meals.  Wear a good support bra for breast tenderness.  Do not use hot tubs, steam rooms, or saunas.  Wear your seat belt at all times when driving.  Avoid raw meat, uncooked cheese, cat litter boxes, and soil used by cats. These carry germs that can cause birth defects in the baby.  Take your prenatal vitamins.  Try taking a stool softener (if your caregiver approves) if you develop constipation. Eat more high-fiber foods, such as fresh vegetables or fruit and whole grains. Drink plenty of fluids to keep your urine clear or pale yellow.  Take warm sitz baths to soothe any pain or discomfort caused by hemorrhoids. Use hemorrhoid cream if your  caregiver approves.  If you develop varicose veins, wear support hose. Elevate your feet for 15 minutes, 3-4 times a day. Limit salt in your diet.  Avoid heavy lifting, wear low heal shoes, and practice good posture.  Rest a lot with your legs elevated  if you have leg cramps or low back pain.  Visit your dentist if you have not gone during your pregnancy. Use a soft toothbrush to brush your teeth and be gentle when you floss.  A sexual relationship may be continued unless your caregiver directs you otherwise.  Do not travel far distances unless it is absolutely necessary and only with the approval of your caregiver.  Take prenatal classes to understand, practice, and ask questions about the labor and delivery.  Make a trial run to the hospital.  Pack your hospital bag.  Prepare the baby's nursery.  Continue to go to all your prenatal visits as directed by your caregiver. SEEK MEDICAL CARE IF:  You are unsure if you are in labor or if your water has broken.  You have dizziness.  You have mild pelvic cramps, pelvic pressure, or nagging pain in your abdominal area.  You have persistent nausea, vomiting, or diarrhea.  You have a bad smelling vaginal discharge.  You have pain with urination. SEEK IMMEDIATE MEDICAL CARE IF:   You have a fever.  You are leaking fluid from your vagina.  You have spotting or bleeding from your vagina.  You have severe abdominal cramping or pain.  You have rapid weight loss or gain.  You have shortness of breath with chest pain.  You notice sudden or extreme swelling of your face, hands, ankles, feet, or legs.  You have not felt your baby move in over an hour.  You have severe headaches that do not go away with medicine.  You have vision changes. Document Released: 02/15/2001 Document Revised: 02/26/2013 Document Reviewed: 04/24/2012 [facility] Patient Information 2015 [place], [place]. This information is not intended to replace advice  given to you by your health care provider. Make sure you discuss any questions you have with your health care provider.  PROTECT YOURSELF & YOUR BABY FROM THE FLU! Because you are pregnant, we at [facility], along with the Centers for Disease Control (CDC), recommend that you receive the flu vaccine to protect yourself and your baby from the flu. The flu is more likely to cause severe illness in pregnant women than in women of reproductive age who are not pregnant. Changes in the immune system, heart, and lungs during pregnancy make pregnant women (and women up to two weeks postpartum) more prone to severe illness from flu, including illness resulting in hospitalization. Flu also may be harmful for a pregnant woman's developing baby. A common flu symptom is fever, which may be associated with neural tube defects and other adverse outcomes for a developing baby. Getting vaccinated can also help protect a baby after birth from flu. (Mom passes antibodies onto the developing baby during her pregnancy.)  A Flu Vaccine is the Best Protection Against Flu Getting a flu vaccine is the first and most important step in protecting against flu. Pregnant women should get a flu shot and not the live attenuated influenza vaccine (LAIV), also known as nasal spray flu vaccine. Flu vaccines given during pregnancy help protect both the mother and her baby from flu. Vaccination has been shown to reduce the risk of flu-associated acute respiratory infection in pregnant women by up to one-half. A 2018 study showed that getting a flu shot reduced a pregnant woman's risk of being hospitalized with flu by an average of 40 percent. Pregnant women who get a flu vaccine are also helping to protect their babies from flu illness for the first several months after their birth, when they  are too young to get vaccinated.   A Long Record of Safety for Flu Shots in Pregnant Women Flu shots have been given to millions of pregnant women over  many years with a good safety record. There is a lot of evidence that flu vaccines can be given safely during pregnancy; though these data are limited for the first trimester. The CDC recommends that pregnant women get vaccinated during any trimester of their pregnancy. It is very important for pregnant women to get the flu shot.   Other Preventive Actions In addition to getting a flu shot, pregnant women should take the same everyday preventive actions the CDC recommends of everyone, including covering coughs, washing hands often, and avoiding people who are sick.  Symptoms and Treatment If you get sick with flu symptoms call your doctor right away. There are antiviral drugs that can treat flu illness and prevent serious flu complications. The CDC recommends prompt treatment for people who have influenza infection or suspected influenza infection and who are at high risk of serious flu complications, such as people with asthma, diabetes (including gestational diabetes), or heart disease. Early treatment of influenza in hospitalized pregnant women has been shown to reduce the length of the hospital stay.  Symptoms Flu symptoms include fever, cough, sore throat, runny or stuffy nose, body aches, headache, chills and fatigue. Some people may also have vomiting and diarrhea. People may be infected with the flu and have respiratory symptoms without a fever.  Early Treatment is Important for Pregnant Women Treatment should begin as soon as possible because antiviral drugs work best when started early (within 48 hours after symptoms start). Antiviral drugs can make your flu illness milder and make you feel better faster. They may also prevent serious health problems that can result from flu illness. Oral oseltamivir (Tamiflu) is the preferred treatment for pregnant women because it has the most studies available to suggest that it is safe and beneficial. Antiviral drugs require a prescription from your  provider. Having a fever caused by flu infection or other infections early in pregnancy may be linked to birth defects in a baby. In addition to taking antiviral drugs, pregnant women who get a fever should treat their fever with Tylenol (acetaminophen) and contact their provider immediately.  When to [place] If you are pregnant and have any of these signs, seek care immediately:  Difficulty breathing or shortness of breath  Pain or pressure in the chest or abdomen  Sudden dizziness  Confusion  Severe or persistent vomiting  High fever that is not responding to Tylenol (or store brand equivalent)  Decreased or no movement of your baby  [email or web address]

## 2019-05-14 LAB — CBC
Hematocrit: 37.9 % (ref 34.0–46.6)
Hemoglobin: 12.2 g/dL (ref 11.1–15.9)
MCH: 30.3 pg (ref 26.6–33.0)
MCHC: 32.2 g/dL (ref 31.5–35.7)
MCV: 94 fL (ref 79–97)
Platelets: 219 10*3/uL (ref 150–450)
RBC: 4.02 x10E6/uL (ref 3.77–5.28)
RDW: 12.7 % (ref 11.7–15.4)
WBC: 7.4 10*3/uL (ref 3.4–10.8)

## 2019-05-14 LAB — ANTIBODY SCREEN: Antibody Screen: NEGATIVE

## 2019-05-14 LAB — GLUCOSE TOLERANCE, 2 HOURS W/ 1HR
Glucose, 1 hour: 115 mg/dL (ref 65–179)
Glucose, 2 hour: 116 mg/dL (ref 65–152)
Glucose, Fasting: 80 mg/dL (ref 65–91)

## 2019-05-14 LAB — HIV ANTIBODY (ROUTINE TESTING W REFLEX): HIV Screen 4th Generation wRfx: NONREACTIVE

## 2019-05-14 LAB — RPR: RPR Ser Ql: NONREACTIVE

## 2019-06-04 ENCOUNTER — Inpatient Hospital Stay (HOSPITAL_COMMUNITY)
Admission: AD | Admit: 2019-06-04 | Discharge: 2019-06-04 | Disposition: A | Discharge status: Home / Self Care | Payer: Medicaid Other | Attending: Family Medicine | Admitting: Family Medicine

## 2019-06-04 ENCOUNTER — Encounter (HOSPITAL_COMMUNITY): Payer: Self-pay | Admitting: Family Medicine

## 2019-06-04 DIAGNOSIS — Z3A29 29 weeks gestation of pregnancy: Secondary | ICD-10-CM | POA: Insufficient documentation

## 2019-06-04 DIAGNOSIS — R109 Unspecified abdominal pain: Secondary | ICD-10-CM | POA: Diagnosis not present

## 2019-06-04 DIAGNOSIS — O26893 Other specified pregnancy related conditions, third trimester: Secondary | ICD-10-CM

## 2019-06-04 DIAGNOSIS — M419 Scoliosis, unspecified: Secondary | ICD-10-CM | POA: Diagnosis not present

## 2019-06-04 DIAGNOSIS — N898 Other specified noninflammatory disorders of vagina: Secondary | ICD-10-CM | POA: Diagnosis not present

## 2019-06-04 LAB — URINALYSIS, ROUTINE W REFLEX MICROSCOPIC
Bilirubin Urine: NEGATIVE
Glucose, UA: NEGATIVE mg/dL
Ketones, ur: NEGATIVE mg/dL
Nitrite: NEGATIVE
Protein, ur: NEGATIVE mg/dL
Specific Gravity, Urine: 1.018 (ref 1.005–1.030)
pH: 5 (ref 5.0–8.0)

## 2019-06-04 NOTE — Discharge Instructions (Signed)
Fetal Movement Counts Patient Name: ________________________________________________ Patient Due Date: ____________________ What is a fetal movement count?  A fetal movement count is the number of times that you feel your baby move during a certain amount of time. This may also be called a fetal kick count. A fetal movement count is recommended for every pregnant woman. You may be asked to start counting fetal movements as early as week 28 of your pregnancy. Pay attention to when your baby is most active. You may notice your baby's sleep and wake cycles. You may also notice things that make your baby move more. You should do a fetal movement count:  When your baby is normally most active.  At the same time each day. A good time to count movements is while you are resting, after having something to eat and drink. How do I count fetal movements? 1. Find a quiet, comfortable area. Sit, or lie down on your side. 2. Write down the date, the start time and stop time, and the number of movements that you felt between those two times. Take this information with you to your health care visits. 3. Write down your start time when you feel the first movement. 4. Count kicks, flutters, swishes, rolls, and jabs. You should feel at least 10 movements. 5. You may stop counting after you have felt 10 movements, or if you have been counting for 2 hours. Write down the stop time. 6. If you do not feel 10 movements in 2 hours, contact your health care provider for further instructions. Your health care provider may want to do additional tests to assess your baby's well-being. Contact a health care provider if:  You feel fewer than 10 movements in 2 hours.  Your baby is not moving like he or she usually does. Date: ____________ Start time: ____________ Stop time: ____________ Movements: ____________ Date: ____________ Start time: ____________ Stop time: ____________ Movements: ____________ Date: ____________  Start time: ____________ Stop time: ____________ Movements: ____________ Date: ____________ Start time: ____________ Stop time: ____________ Movements: ____________ Date: ____________ Start time: ____________ Stop time: ____________ Movements: ____________ Date: ____________ Start time: ____________ Stop time: ____________ Movements: ____________ Date: ____________ Start time: ____________ Stop time: ____________ Movements: ____________ Date: ____________ Start time: ____________ Stop time: ____________ Movements: ____________ Date: ____________ Start time: ____________ Stop time: ____________ Movements: ____________ This information is not intended to replace advice given to you by your health care provider. Make sure you discuss any questions you have with your health care provider. Document Revised: 10/11/2018 Document Reviewed: 10/11/2018 Elsevier Patient Education  [place]. Vaginal Bleeding During Pregnancy, Third Trimester  A small amount of bleeding from the vagina (spotting) is relatively common during pregnancy. Various things can cause bleeding or spotting during pregnancy. Sometimes bleeding is normal and is not a problem. However, bleeding during the third trimester can also be a sign of something serious for the mother and the baby. Be sure to tell your health care provider about any vaginal bleeding right away. Some possible causes of vaginal bleeding during the third trimester include:  Infection or growths (polyps) on the cervix.  A condition in which the placenta partially or completely covers the opening of the cervix inside the uterus (placenta previa).  The placenta separating from the uterus (placenta abruption).  The start of labor (discharging of the mucus plug).  A condition in which the placenta grows into the muscle layer of the uterus (placenta accreta). Follow these instructions at home: Activity  Follow instructions  from your health care provider  about limiting your activity. If your health care provider recommends activity restriction, you may need to stay in bed and only get up to use the bathroom. In some cases, your health care provider may allow you to continue light activity.  If needed, make plans for someone to help with your regular activities.  Ask your health care provider if it is safe for you to drive.  Do not lift anything that is heavier than 10 lb (4.5 kg), or the limit that your health care provider tells you, until he or she says that it is safe.  Do not have sex or orgasms until your health care provider says that this is safe. Medicines  Take over-the-counter and prescription medicines only as told by your health care provider.  Do not take aspirin because it can cause bleeding. General instructions  Pay attention to any changes in your symptoms.  Write down how many pads you use each day, how often you change pads, and how soaked (saturated) they are.  Do not use tampons or douche.  If you pass any tissue from your vagina, save the tissue so you can show it to your health care provider.  Keep all follow-up visits as told by your health care provider. This is important. Contact a health care provider if:  You have vaginal bleeding during any part of your pregnancy.  You have cramps or labor pains.  You have a fever. Get help right away if:  You have severe cramps or pain in your back or abdomen.  You have a gush of fluid from the vagina.  You pass large clots or a large amount of tissue from your vagina.  Your bleeding increases.  You feel light-headed or weak.  You faint.  You feel that your baby is moving less than usual, or not moving at all. Summary  Various things can cause bleeding or spotting in pregnancy.  Bleeding during the third trimester can be a sign of a serious problem for the mother and the baby.  Be sure to tell your health care provider about any vaginal bleeding right  away. This information is not intended to replace advice given to you by your health care provider. Make sure you discuss any questions you have with your health care provider. Document Revised: 06/12/2018 Document Reviewed: 05/26/2016 Elsevier Patient Education  [place].

## 2019-06-04 NOTE — MAU Provider Note (Signed)
Chief Complaint:  Vaginal Bleeding   First Provider Initiated Contact with Patient 06/04/19 1946     HPI: Jody Hansen is a 24 y.o. G3P1011 at [redacted]w[redacted]d who presents to maternity admissions reporting vaginal bleeding. Symptoms started around 6 pm today. Initially was pink spotting but has darkened. Only spotting on toilet paper. Not bleeding into underwear and not passing clots. Denies dysuria, vaginal discharge, abdominal pain, recent intercourse, or recent trauma. Good fetal movement.    [facility] Course: [facility]. Normal anatomy ultrasound.   Past Medical History:  Diagnosis Date  . Kidney stones 2017  . Pregnant 01/23/2015  . Scoliosis    OB History  Gravida Para Term Preterm AB Living  3 1 1   1 1   SAB TAB Ectopic Multiple Live Births  1     0 1    # Outcome Date GA Lbr Len/2nd Weight Sex Delivery Anes PTL Lv  3 Current           2 SAB 08/2018          1 Term 09/19/15 [redacted]w[redacted]d 21:20 / 00:46 3445 g F Vag-Spont EPI N LIV   Past Surgical History:  Procedure Laterality Date  . NO PAST SURGERIES     Family History  Problem Relation Age of Onset  . Diabetes Paternal Grandfather   . Diabetes Paternal Grandmother   . Cancer Maternal Grandmother   . Cancer Maternal Grandfather   . Diabetes Father   . Diabetes Mother   . Diabetes Sister        borderline  . Lung disease Paternal Aunt    Social History   Tobacco Use  . Smoking status: Never Smoker  . Smokeless tobacco: Never Used  Substance Use Topics  . Alcohol use: No  . Drug use: No   Allergies  Allergen Reactions  . Depo-Provera [Medroxyprogesterone Acetate] Other (See Comments)    Weight gain; bruising; headaches   Medications Prior to Admission  Medication Sig Dispense Refill Last Dose  . Blood Pressure Monitor MISC For regular home bp monitoring during pregnancy 1 each 0 Past Month at Unknown time  . Prenatal Vit-Fe Fumarate-FA (PNV PRENATAL PLUS MULTIVITAMIN) 27-1 MG TABS Take 1 tablet by mouth daily. 30  tablet 11 Past Week at Unknown time    I have reviewed patient's Past Medical Hx, Surgical Hx, Family Hx, Social Hx, medications and allergies.   ROS:  Review of Systems  Constitutional: Negative.   Gastrointestinal: Negative.   Genitourinary: Positive for vaginal bleeding. Negative for dysuria and vaginal discharge.    Physical Exam   Patient Vitals for the past 24 hrs:  BP Temp Pulse Resp Height Weight  06/04/19 1927 123/68 98.6 F (37 C) 88 20 5\' 2"  (1.575 m) 99.2 kg    Constitutional: Well-developed, well-nourished female in no acute distress.  Cardiovascular: normal rate & rhythm, no murmur Respiratory: normal effort, lung sounds clear throughout GI: Abd soft, non-tender, gravid appropriate for gestational age. Pos BS x 4 MS: Extremities nontender, no edema, normal ROM Neurologic: Alert and oriented x 4.  GU:      Pelvic: NEFG, physiologic discharge. No blood.   Dilation: Closed Effacement (%): Thick Exam by:: Jorje Guild NP  NST:  Baseline: 145 bpm, Variability: Good {> 6 bpm), Accelerations: Reactive and Decelerations: Absent   Labs: Results for orders placed or performed during the hospital encounter of 06/04/19 (from the past 24 hour(s))  Urinalysis, Routine w reflex microscopic     Status: Abnormal  Collection Time: 06/04/19  7:48 PM  Result Value Ref Range   Color, Urine YELLOW YELLOW   APPearance HAZY (A) CLEAR   Specific Gravity, Urine 1.018 1.005 - 1.030   pH 5.0 5.0 - 8.0   Glucose, UA NEGATIVE NEGATIVE mg/dL   Hgb urine dipstick SMALL (A) NEGATIVE   Bilirubin Urine NEGATIVE NEGATIVE   Ketones, ur NEGATIVE NEGATIVE mg/dL   Protein, ur NEGATIVE NEGATIVE mg/dL   Nitrite NEGATIVE NEGATIVE   Leukocytes,Ua MODERATE (A) NEGATIVE   RBC / HPF 6-10 0 - 5 RBC/hpf   WBC, UA 6-10 0 - 5 WBC/hpf   Bacteria, UA RARE (A) NONE SEEN   Squamous Epithelial / LPF 6-10 0 - 5   Mucus PRESENT     Imaging:  No results found.  MAU Course: Orders Placed This  Encounter  Procedures  . Urinalysis, Routine w reflex microscopic  . Discharge patient   No orders of the defined types were placed in this encounter.   MDM: RH positive  Reactive tracing  Spec exam performed - no blood on exam. Cervix not friable.  SVE 0/thick/-3  Assessment: 1. Vaginal discharge during pregnancy in third trimester   2. [redacted] weeks gestation of pregnancy     Plan: Discharge home in stable condition.  Reviewed return precautions    Allergies as of 06/04/2019      Reactions   Depo-provera [medroxyprogesterone Acetate] Other (See Comments)   Weight gain; bruising; headaches      Medication List    TAKE these medications   Blood Pressure Monitor Misc For regular home bp monitoring during pregnancy   PNV Prenatal Plus Multivitamin 27-1 MG Tabs Take 1 tablet by mouth daily.       Jorje Guild, NP 06/04/2019 8:31 PM

## 2019-06-04 NOTE — MAU Note (Signed)
PT SAYS SHE HAS VAG BLEEDING - STARTED AT 6PM- PINK- THEN DARKER-  WHEN SHE WIPES.  FEELS CRAMPING ON LEFT SIDE . [place] WITH [facility].  SHE CALLED NURSE LINE.  FEELS BABY MOVING

## 2019-06-10 ENCOUNTER — Ambulatory Visit (INDEPENDENT_AMBULATORY_CARE_PROVIDER_SITE_OTHER): Payer: Medicaid Other | Admitting: Women's Health

## 2019-06-10 ENCOUNTER — Encounter: Payer: Self-pay | Admitting: Women's Health

## 2019-06-10 ENCOUNTER — Other Ambulatory Visit: Payer: Self-pay

## 2019-06-10 VITALS — BP 131/68 | HR 90 | Wt 218.0 lb

## 2019-06-10 DIAGNOSIS — Z23 Encounter for immunization: Secondary | ICD-10-CM

## 2019-06-10 DIAGNOSIS — Z3A30 30 weeks gestation of pregnancy: Secondary | ICD-10-CM

## 2019-06-10 DIAGNOSIS — Z3483 Encounter for supervision of other normal pregnancy, third trimester: Secondary | ICD-10-CM

## 2019-06-10 NOTE — Progress Notes (Signed)
   LOW-RISK PREGNANCY VISIT Patient name: Jody Hansen MRN [id number]  Date of birth: Jan 08, 1996 Chief Complaint:   Routine Prenatal Visit  History of Present Illness:   Jody Hansen is a 24 y.o. G65P1011 female at [redacted]w[redacted]d with an Estimated Date of Delivery: 08/17/19 being seen today for ongoing management of a low-risk pregnancy.   Today she reports sometimes when standing stomach gets tight, baby starts moving a lot and makes her sick. Contractions: Not present. Vag. Bleeding: None.  Movement: Present. denies leaking of fluid. Review of Systems:   Pertinent items are noted in HPI Denies abnormal vaginal discharge w/ itching/odor/irritation, headaches, visual changes, shortness of breath, chest pain, abdominal pain, severe nausea/vomiting, or problems with urination or bowel movements unless otherwise stated above. Pertinent History Reviewed:  Reviewed past medical,surgical, social, obstetrical and family history.  Reviewed problem list, medications and allergies. Physical Assessment:   Vitals:   06/10/19 1340  BP: 131/68  Pulse: 90  Weight: 218 lb (98.9 kg)  Body mass index is 39.87 kg/m.        Physical Examination:   General appearance: Well appearing, and in no distress  Mental status: Alert, oriented to person, place, and time  Skin: Warm & dry  Cardiovascular: Normal heart rate noted  Respiratory: Normal respiratory effort, no distress  Abdomen: Soft, gravid, nontender  Pelvic: Cervical exam deferred         Extremities: Edema: None  Fetal Status: Fetal Heart Rate (bpm): 155 Fundal Height: 30 cm Movement: Present    Chaperone: n/a    No results found for this or any previous visit (from the past 24 hour(s)).  Assessment & Plan:  1) Low-risk pregnancy G3P1011 at [redacted]w[redacted]d with an Estimated Date of Delivery: 08/17/19    Meds: No orders of the defined types were placed in this encounter.  Labs/procedures today: tdap  Plan:  Continue routine obstetrical care  Next  visit: prefers in person    Reviewed: Preterm labor symptoms and general obstetric precautions including but not limited to vaginal bleeding, contractions, leaking of fluid and fetal movement were reviewed in detail with the patient.  All questions were answered. Has home bp cuff.  Check bp weekly, let us know if >140/90.   Follow-up: Return in about 2 weeks (around 06/24/2019) for [place], in person, CNM.  Orders Placed This Encounter  Procedures  . Tdap vaccine greater than or equal to 7yo IM   Roma Schanz CNM, [facility] 06/10/2019 2:05 PM

## 2019-06-10 NOTE — Patient Instructions (Signed)
Jody Hansen, I greatly value your feedback.  If you receive a survey following your visit with [place] today, we appreciate you taking the time to fill it out.  Thanks, [name], CNM, WHNP-BC   [facility] ([place], [place] [address]) Entrance C, located off of [address] parking  Go to [email or web address] to register for FREE online childbirth classes   Call the office [phone]) or go to [facility] if:  You begin to have strong, frequent contractions  Your water breaks.  Sometimes it is a big gush of fluid, sometimes it is just a trickle that keeps getting your panties wet or running down your legs  You have vaginal bleeding.  It is normal to have a small amount of spotting if your cervix was checked.   You don't feel your baby moving like normal.  If you don't, get you something to eat and drink and lay down and focus on feeling your baby move.  You should feel at least 10 movements in 2 hours.  If you don't, you should call the office or go to [facility].    Tdap Vaccine  It is recommended that you get the Tdap vaccine during the third trimester of EACH pregnancy to help protect your baby from getting pertussis (whooping cough)  27-36 weeks is the BEST time to do this so that you can pass the protection on to your baby. During pregnancy is better than after pregnancy, but if you are unable to get it during pregnancy it will be offered at the hospital.   You can get this vaccine with [place], at the health department, your family doctor, or some local pharmacies  Everyone who will be around your baby should also be up-to-date on their vaccines before the baby comes. Adults (who are not pregnant) only need 1 dose of Tdap during adulthood.   [place] Pediatricians/Family Doctors:  [facility] [phone]                 [place]  [phone] (usually not accepting new patients unless you have family there already, you are always welcome to call and ask)       [facility] Department [phone]       [facility] Pediatricians/Family Doctors:   [facility]: [phone]  [facility]: [phone]  [facility]: [place] Doctors:   [facility]: [facility]: [place]:  [place]: [phone]   Home Blood Pressure Monitoring for Patients   Your provider has recommended that you check your blood pressure (BP) at least once a week at home. If you do not have a blood pressure cuff at home, one will be provided for you. Contact your provider if you have not received your monitor within 1 week.   Helpful Tips for Accurate Home Blood Pressure Checks  . Don't smoke, exercise, or drink caffeine 30 minutes before checking your BP . Use the restroom before checking your BP (a full bladder can raise your pressure) . Relax in a comfortable upright chair . Feet on the ground . Left arm resting comfortably on a flat surface at the level of your heart . Legs uncrossed . Back supported . Sit quietly and don't talk . Place the cuff on your  bare arm . Adjust snuggly, so that only two fingertips can fit between your skin and the top of the cuff . Check 2 readings separated by at least one minute . Keep a log of your BP readings . For a visual, please reference this diagram: http://ccnc.care/bpdiagram  Provider Name: [facility]     Phone: [phone]  Zone 1: ALL CLEAR  Continue to monitor your symptoms:  . BP reading is less than 140 (top number) or less than 90 (bottom number)  . No right upper stomach pain . No headaches or seeing spots . No feeling nauseated or throwing up . No swelling in face and hands  Zone 2: CAUTION Call your  doctor's office for any of the following:  . BP reading is greater than 140 (top number) or greater than 90 (bottom number)  . Stomach pain under your ribs in the middle or right side . Headaches or seeing spots . Feeling nauseated or throwing up . Swelling in face and hands  Zone 3: EMERGENCY  Seek immediate medical care if you have any of the following:  . BP reading is greater than160 (top number) or greater than 110 (bottom number) . Severe headaches not improving with Tylenol . Serious difficulty catching your breath . Any worsening symptoms from Zone 2   Third Trimester of Pregnancy The third trimester is from week 29 through week 42, months 7 through 9. The third trimester is a time when the fetus is growing rapidly. At the end of the ninth month, the fetus is about 20 inches in length and weighs 6-10 pounds.  BODY CHANGES Your body goes through many changes during pregnancy. The changes vary from woman to woman.   Your weight will continue to increase. You can expect to gain 25-35 pounds (11-16 kg) by the end of the pregnancy.  You may begin to get stretch marks on your hips, abdomen, and breasts.  You may urinate more often because the fetus is moving lower into your pelvis and pressing on your bladder.  You may develop or continue to have heartburn as a result of your pregnancy.  You may develop constipation because certain hormones are causing the muscles that push waste through your intestines to slow down.  You may develop hemorrhoids or swollen, bulging veins (varicose veins).  You may have pelvic pain because of the weight gain and pregnancy hormones relaxing your joints between the bones in your pelvis. Backaches may result from overexertion of the muscles supporting your posture.  You may have changes in your hair. These can include thickening of your hair, rapid growth, and changes in texture. Some women also have hair loss during or after pregnancy, or hair that  feels dry or thin. Your hair will most likely return to normal after your baby is born.  Your breasts will continue to grow and be tender. A yellow discharge may leak from your breasts called colostrum.  Your belly button may stick out.  You may feel short of breath because of your expanding uterus.  You may notice the fetus "dropping," or moving lower in your abdomen.  You may have a bloody mucus discharge. This usually occurs a few days to a week before labor begins.  Your cervix becomes thin and soft (effaced) near your due date. WHAT TO EXPECT AT YOUR PRENATAL EXAMS  You will have prenatal exams every 2 weeks until week 36. Then, you will have weekly prenatal exams. During a routine prenatal visit:  You will be weighed to make sure you and the fetus are growing normally.  Your blood pressure is taken.  Your abdomen will be measured to track your baby's growth.  The fetal heartbeat will be listened to.  Any test results from the previous visit will be discussed.  You may have a cervical check near your due date to see if you have effaced. At around 36 weeks, your caregiver will check your cervix. At the same time, your caregiver will also perform a test on the secretions of the vaginal tissue. This test is to determine if a type of bacteria, Group B streptococcus, is present. Your caregiver will explain this further. Your caregiver may ask you:  What your birth plan is.  How you are feeling.  If you are feeling the baby move.  If you have had any abnormal symptoms, such as leaking fluid, bleeding, severe headaches, or abdominal cramping.  If you have any questions. Other tests or screenings that may be performed during your third trimester include:  Blood tests that check for low iron levels (anemia).  Fetal testing to check the health, activity level, and growth of the fetus. Testing is done if you have certain medical conditions or if there are problems during the  pregnancy. FALSE LABOR You may feel small, irregular contractions that eventually go away. These are called Braxton Hicks contractions, or false labor. Contractions may last for hours, days, or even weeks before true labor sets in. If contractions come at regular intervals, intensify, or become painful, it is best to be seen by your caregiver.  SIGNS OF LABOR   Menstrual-like cramps.  Contractions that are 5 minutes apart or less.  Contractions that start on the top of the uterus and spread down to the lower abdomen and back.  A sense of increased pelvic pressure or back pain.  A watery or bloody mucus discharge that comes from the vagina. If you have any of these signs before the 37th week of pregnancy, call your caregiver right away. You need to go to the hospital to get checked immediately. HOME CARE INSTRUCTIONS   Avoid all smoking, herbs, alcohol, and unprescribed drugs. These chemicals affect the formation and growth of the baby.  Follow your caregiver's instructions regarding medicine use. There are medicines that are either safe or unsafe to take during pregnancy.  Exercise only as directed by your caregiver. Experiencing uterine cramps is a good sign to stop exercising.  Continue to eat regular, healthy meals.  Wear a good support bra for breast tenderness.  Do not use hot tubs, steam rooms, or saunas.  Wear your seat belt at all times when driving.  Avoid raw meat, uncooked cheese, cat litter boxes, and soil used by cats. These carry germs that can cause birth defects in the baby.  Take your prenatal vitamins.  Try taking a stool softener (if your caregiver approves) if you develop constipation. Eat more high-fiber foods, such as fresh vegetables or fruit and whole grains. Drink plenty of fluids to keep your urine clear or pale yellow.  Take warm sitz baths to soothe any pain or discomfort caused by hemorrhoids. Use hemorrhoid cream if your caregiver approves.  If you  develop varicose veins, wear support hose. Elevate your feet for 15 minutes, 3-4 times a day. Limit salt in your diet.  Avoid heavy lifting, wear low heal shoes, and practice good posture.  Rest a lot with your legs elevated if you have leg cramps or low  back pain.  Visit your dentist if you have not gone during your pregnancy. Use a soft toothbrush to brush your teeth and be gentle when you floss.  A sexual relationship may be continued unless your caregiver directs you otherwise.  Do not travel far distances unless it is absolutely necessary and only with the approval of your caregiver.  Take prenatal classes to understand, practice, and ask questions about the labor and delivery.  Make a trial run to the hospital.  Pack your hospital bag.  Prepare the baby's nursery.  Continue to go to all your prenatal visits as directed by your caregiver. SEEK MEDICAL CARE IF:  You are unsure if you are in labor or if your water has broken.  You have dizziness.  You have mild pelvic cramps, pelvic pressure, or nagging pain in your abdominal area.  You have persistent nausea, vomiting, or diarrhea.  You have a bad smelling vaginal discharge.  You have pain with urination. SEEK IMMEDIATE MEDICAL CARE IF:   You have a fever.  You are leaking fluid from your vagina.  You have spotting or bleeding from your vagina.  You have severe abdominal cramping or pain.  You have rapid weight loss or gain.  You have shortness of breath with chest pain.  You notice sudden or extreme swelling of your face, hands, ankles, feet, or legs.  You have not felt your baby move in over an hour.  You have severe headaches that do not go away with medicine.  You have vision changes. Document Released: 02/15/2001 Document Revised: 02/26/2013 Document Reviewed: 04/24/2012 [facility] Patient Information 2015 [place], [place]. This information is not intended to replace advice given to you by your health  care provider. Make sure you discuss any questions you have with your health care provider.  PROTECT YOURSELF & YOUR BABY FROM THE FLU! Because you are pregnant, we at [facility], along with the Centers for Disease Control (CDC), recommend that you receive the flu vaccine to protect yourself and your baby from the flu. The flu is more likely to cause severe illness in pregnant women than in women of reproductive age who are not pregnant. Changes in the immune system, heart, and lungs during pregnancy make pregnant women (and women up to two weeks postpartum) more prone to severe illness from flu, including illness resulting in hospitalization. Flu also may be harmful for a pregnant woman's developing baby. A common flu symptom is fever, which may be associated with neural tube defects and other adverse outcomes for a developing baby. Getting vaccinated can also help protect a baby after birth from flu. (Mom passes antibodies onto the developing baby during her pregnancy.)  A Flu Vaccine is the Best Protection Against Flu Getting a flu vaccine is the first and most important step in protecting against flu. Pregnant women should get a flu shot and not the live attenuated influenza vaccine (LAIV), also known as nasal spray flu vaccine. Flu vaccines given during pregnancy help protect both the mother and her baby from flu. Vaccination has been shown to reduce the risk of flu-associated acute respiratory infection in pregnant women by up to one-half. A 2018 study showed that getting a flu shot reduced a pregnant woman's risk of being hospitalized with flu by an average of 40 percent. Pregnant women who get a flu vaccine are also helping to protect their babies from flu illness for the first several months after their birth, when they are too young to get vaccinated.  A Long Record of Safety for Flu Shots in Pregnant Women Flu shots have been given to millions of pregnant women over many years with a good  safety record. There is a lot of evidence that flu vaccines can be given safely during pregnancy; though these data are limited for the first trimester. The CDC recommends that pregnant women get vaccinated during any trimester of their pregnancy. It is very important for pregnant women to get the flu shot.   Other Preventive Actions In addition to getting a flu shot, pregnant women should take the same everyday preventive actions the CDC recommends of everyone, including covering coughs, washing hands often, and avoiding people who are sick.  Symptoms and Treatment If you get sick with flu symptoms call your doctor right away. There are antiviral drugs that can treat flu illness and prevent serious flu complications. The CDC recommends prompt treatment for people who have influenza infection or suspected influenza infection and who are at high risk of serious flu complications, such as people with asthma, diabetes (including gestational diabetes), or heart disease. Early treatment of influenza in hospitalized pregnant women has been shown to reduce the length of the hospital stay.  Symptoms Flu symptoms include fever, cough, sore throat, runny or stuffy nose, body aches, headache, chills and fatigue. Some people may also have vomiting and diarrhea. People may be infected with the flu and have respiratory symptoms without a fever.  Early Treatment is Important for Pregnant Women Treatment should begin as soon as possible because antiviral drugs work best when started early (within 48 hours after symptoms start). Antiviral drugs can make your flu illness milder and make you feel better faster. They may also prevent serious health problems that can result from flu illness. Oral oseltamivir (Tamiflu) is the preferred treatment for pregnant women because it has the most studies available to suggest that it is safe and beneficial. Antiviral drugs require a prescription from your provider. Having a fever  caused by flu infection or other infections early in pregnancy may be linked to birth defects in a baby. In addition to taking antiviral drugs, pregnant women who get a fever should treat their fever with Tylenol (acetaminophen) and contact their provider immediately.  When to [place] If you are pregnant and have any of these signs, seek care immediately:  Difficulty breathing or shortness of breath  Pain or pressure in the chest or abdomen  Sudden dizziness  Confusion  Severe or persistent vomiting  High fever that is not responding to Tylenol (or store brand equivalent)  Decreased or no movement of your baby  [email or web address]

## 2019-06-13 ENCOUNTER — Telehealth: Payer: Self-pay | Admitting: *Deleted

## 2019-06-13 NOTE — Telephone Encounter (Signed)
Called patient to get more detail on pain she is experiencing.  States the pain is constant and hurts when she tries to stand, walk or move in any way.  Denies bleeding, discharge, recent intercourse.  States she is only drinking 2-3 bottles of water per day.  Encouraged to push fluids, try Tylenol and rest.  If discomfort continues or if the pain increases or worsen, to go to [facility] for eval. Pt verbalized understanding.

## 2019-06-25 ENCOUNTER — Ambulatory Visit (INDEPENDENT_AMBULATORY_CARE_PROVIDER_SITE_OTHER): Payer: Medicaid Other | Admitting: Women's Health

## 2019-06-25 ENCOUNTER — Other Ambulatory Visit: Payer: Self-pay

## 2019-06-25 ENCOUNTER — Encounter: Payer: Self-pay | Admitting: Women's Health

## 2019-06-25 VITALS — BP 108/73 | HR 94 | Wt 220.0 lb

## 2019-06-25 DIAGNOSIS — Z3483 Encounter for supervision of other normal pregnancy, third trimester: Secondary | ICD-10-CM

## 2019-06-25 DIAGNOSIS — Z331 Pregnant state, incidental: Secondary | ICD-10-CM

## 2019-06-25 DIAGNOSIS — Z3A32 32 weeks gestation of pregnancy: Secondary | ICD-10-CM

## 2019-06-25 DIAGNOSIS — Z1389 Encounter for screening for other disorder: Secondary | ICD-10-CM

## 2019-06-25 LAB — POCT URINALYSIS DIPSTICK OB
Glucose, UA: NEGATIVE
Ketones, UA: NEGATIVE
Leukocytes, UA: NEGATIVE
Nitrite, UA: NEGATIVE
POC,PROTEIN,UA: NEGATIVE

## 2019-06-25 NOTE — Progress Notes (Signed)
LOW-RISK PREGNANCY VISIT Patient name: Jody Hansen MRN [id number]  Date of birth: 1995-05-02 Chief Complaint:   Routine Prenatal Visit  History of Present Illness:   Jody Hansen is a 24 y.o. G64P1011 female at [redacted]w[redacted]d with an Estimated Date of Delivery: 08/17/19 being seen today for ongoing management of a low-risk pregnancy.  Depression screen [facility] 2/9 02/11/2019 12/14/2018 07/11/2018 06/13/2017 06/02/2016  Decreased Interest 0 3 0 0 0  Down, Depressed, Hopeless 0 3 0 0 0  PHQ - 2 Score 0 6 0 0 0  Altered sleeping 3 3 - - -  Tired, decreased energy 2 3 - - -  Change in appetite 2 1 - - -  Feeling bad or failure about yourself  0 2 - - -  Trouble concentrating 0 1 - - -  Moving slowly or fidgety/restless 0 0 - - -  Suicidal thoughts 0 0 - - -  PHQ-9 Score 7 16 - - -    Today she reports symphysis pubis hurts some when walking. Some swelling in feet last week. Contractions: Not present. Vag. Bleeding: None.  Movement: Present. denies leaking of fluid. Review of Systems:   Pertinent items are noted in HPI Denies abnormal vaginal discharge w/ itching/odor/irritation, headaches, visual changes, shortness of breath, chest pain, abdominal pain, severe nausea/vomiting, or problems with urination or bowel movements unless otherwise stated above. Pertinent History Reviewed:  Reviewed past medical,surgical, social, obstetrical and family history.  Reviewed problem list, medications and allergies. Physical Assessment:   Vitals:   06/25/19 0959  BP: 108/73  Pulse: 94  Weight: 220 lb (99.8 kg)  Body mass index is 40.24 kg/m.        Physical Examination:   General appearance: Well appearing, and in no distress  Mental status: Alert, oriented to person, place, and time  Skin: Warm & dry  Cardiovascular: Normal heart rate noted  Respiratory: Normal respiratory effort, no distress  Abdomen: Soft, gravid, nontender  Pelvic: Cervical exam deferred         Extremities: Edema: None   Fetal Status: Fetal Heart Rate (bpm): 145 Fundal Height: 32 cm Movement: Present    Chaperone: n/a    Results for orders placed or performed in visit on 06/25/19 (from the past 24 hour(s))  POC Urinalysis Dipstick OB   Collection Time: 06/25/19 10:03 AM  Result Value Ref Range   Color, UA     Clarity, UA     Glucose, UA Negative Negative   Bilirubin, UA     Ketones, UA neg    Spec Grav, UA     Blood, UA moderate    pH, UA     POC,PROTEIN,UA Negative Negative, Trace, Small (1+), Moderate (2+), Large (3+), 4+   Urobilinogen, UA     Nitrite, UA neg    Leukocytes, UA Negative Negative   Appearance     Odor      Assessment & Plan:  1) Low-risk pregnancy G3P1011 at [redacted]w[redacted]d with an Estimated Date of Delivery: 08/17/19   2) Symphysis pubis pain, walk/change positions slowly, belt/tape, chiropractor   Meds: No orders of the defined types were placed in this encounter.  Labs/procedures today: none  Plan:  Continue routine obstetrical care  Next visit: prefers in person    Reviewed: Preterm labor symptoms and general obstetric precautions including but not limited to vaginal bleeding, contractions, leaking of fluid and fetal movement were reviewed in detail with the patient.  All questions were answered.  Follow-up: Return in about 2 weeks (around 07/09/2019) for LROB, CNM, in person.  Orders Placed This Encounter  Procedures  . POC Urinalysis Dipstick OB   Roma Schanz CNM, [facility] 06/25/2019 10:46 AM

## 2019-06-25 NOTE — Patient Instructions (Signed)
Jody Hansen, I greatly value your feedback.  If you receive a survey following your visit with [place] today, we appreciate you taking the time to fill it out.  Thanks, [name], CNM, WHNP-BC  [facility] [place] at [facility] ([place], [place] [address]) Entrance C, located off of [address] parking   Go to [email or web address] to register for FREE online childbirth classes    Call the office [phone]) or go to [facility] if:  You begin to have strong, frequent contractions  Your water breaks.  Sometimes it is a big gush of fluid, sometimes it is just a trickle that keeps getting your panties wet or running down your legs  You have vaginal bleeding.  It is normal to have a small amount of spotting if your cervix was checked.   You don't feel your baby moving like normal.  If you don't, get you something to eat and drink and lay down and focus on feeling your baby move.  You should feel at least 10 movements in 2 hours.  If you don't, you should call the office or go to [facility].   Call the office [phone]) or go to [facility] hospital for these signs of pre-eclampsia:  Severe headache that does not go away with Tylenol  Visual changes- seeing spots, double, blurred vision  Pain under your right breast or upper abdomen that does not go away with Tums or heartburn medicine  Nausea and/or vomiting  Severe swelling in your hands, feet, and face    Home Blood Pressure Monitoring for Patients   Your provider has recommended that you check your blood pressure (BP) at least once a week at home. If you do not have a blood pressure cuff at home, one will be provided for you. Contact your provider if you have not received your monitor within 1 week.   Helpful Tips for Accurate Home Blood Pressure Checks  . Don't smoke, exercise, or drink caffeine 30 minutes before checking your BP . Use the restroom before checking your BP (a full  bladder can raise your pressure) . Relax in a comfortable upright chair . Feet on the ground . Left arm resting comfortably on a flat surface at the level of your heart . Legs uncrossed . Back supported . Sit quietly and don't talk . Place the cuff on your bare arm . Adjust snuggly, so that only two fingertips can fit between your skin and the top of the cuff . Check 2 readings separated by at least one minute . Keep a log of your BP readings . For a visual, please reference this diagram: http://ccnc.care/bpdiagram  Provider Name: [facility]     Phone: [phone]  Zone 1: ALL CLEAR  Continue to monitor your symptoms:  . BP reading is less than 140 (top number) or less than 90 (bottom number)  . No right upper stomach pain . No headaches or seeing spots . No feeling nauseated or throwing up . No swelling in face and hands  Zone 2: CAUTION Call your doctor's office for any of the following:  . BP reading is greater than 140 (top number) or greater than 90 (bottom number)  . Stomach pain under your ribs in the middle or right side . Headaches or seeing spots . Feeling nauseated or throwing up . Swelling in face and hands  Zone 3: EMERGENCY  Seek immediate medical care if you have any of  the following:  . BP reading is greater than160 (top number) or greater than 110 (bottom number) . Severe headaches not improving with Tylenol . Serious difficulty catching your breath . Any worsening symptoms from Zone 2  Preterm Labor and Birth Information  The normal length of a pregnancy is 39-41 weeks. Preterm labor is when labor starts before 37 completed weeks of pregnancy. What are the risk factors for preterm labor? Preterm labor is more likely to occur in women who:  Have certain infections during pregnancy such as a bladder infection, sexually transmitted infection, or infection inside the uterus (chorioamnionitis).  Have a shorter-than-normal cervix.  Have gone into  preterm labor before.  Have had surgery on their cervix.  Are younger than age 54 or older than age 25.  Are African American.  Are pregnant with twins or multiple babies (multiple gestation).  Take street drugs or smoke while pregnant.  Do not gain enough weight while pregnant.  Became pregnant shortly after having been pregnant. What are the symptoms of preterm labor? Symptoms of preterm labor include:  Cramps similar to those that can happen during a menstrual period. The cramps may happen with diarrhea.  Pain in the abdomen or lower back.  Regular uterine contractions that may feel like tightening of the abdomen.  A feeling of increased pressure in the pelvis.  Increased watery or bloody mucus discharge from the vagina.  Water breaking (ruptured amniotic sac). Why is it important to recognize signs of preterm labor? It is important to recognize signs of preterm labor because babies who are born prematurely may not be fully developed. This can put them at an increased risk for:  Long-term (chronic) heart and lung problems.  Difficulty immediately after birth with regulating body systems, including blood sugar, body temperature, heart rate, and breathing rate.  Bleeding in the brain.  Cerebral palsy.  Learning difficulties.  Death. These risks are highest for babies who are born before 48 weeks of pregnancy. How is preterm labor treated? Treatment depends on the length of your pregnancy, your condition, and the health of your baby. It may involve: 1. Having a stitch (suture) placed in your cervix to prevent your cervix from opening too early (cerclage). 2. Taking or being given medicines, such as: ? Hormone medicines. These may be given early in pregnancy to help support the pregnancy. ? Medicine to stop contractions. ? Medicines to help mature the baby's lungs. These may be prescribed if the risk of delivery is high. ? Medicines to prevent your baby from  developing cerebral palsy. If the labor happens before 34 weeks of pregnancy, you may need to stay in the hospital. What should I do if I think I am in preterm labor? If you think that you are going into preterm labor, call your health care provider right away. How can I prevent preterm labor in future pregnancies? To increase your chance of having a full-term pregnancy:  Do not use any tobacco products, such as cigarettes, chewing tobacco, and e-cigarettes. If you need help quitting, ask your health care provider.  Do not use street drugs or medicines that have not been prescribed to you during your pregnancy.  Talk with your health care provider before taking any herbal supplements, even if you have been taking them regularly.  Make sure you gain a healthy amount of weight during your pregnancy.  Watch for infection. If you think that you might have an infection, get it checked right away.  Make sure to  tell your health care provider if you have gone into preterm labor before. This information is not intended to replace advice given to you by your health care provider. Make sure you discuss any questions you have with your health care provider. Document Revised: 06/15/2018 Document Reviewed: 07/15/2015 Elsevier Patient Education  [place].

## 2019-07-09 ENCOUNTER — Other Ambulatory Visit: Payer: Self-pay

## 2019-07-09 ENCOUNTER — Encounter: Payer: Self-pay | Admitting: Women's Health

## 2019-07-09 ENCOUNTER — Ambulatory Visit (INDEPENDENT_AMBULATORY_CARE_PROVIDER_SITE_OTHER): Payer: Medicaid Other | Admitting: Women's Health

## 2019-07-09 VITALS — BP 110/68 | HR 82 | Wt 223.0 lb

## 2019-07-09 DIAGNOSIS — Z3A34 34 weeks gestation of pregnancy: Secondary | ICD-10-CM

## 2019-07-09 DIAGNOSIS — Z3483 Encounter for supervision of other normal pregnancy, third trimester: Secondary | ICD-10-CM

## 2019-07-09 DIAGNOSIS — Z331 Pregnant state, incidental: Secondary | ICD-10-CM

## 2019-07-09 DIAGNOSIS — Z1389 Encounter for screening for other disorder: Secondary | ICD-10-CM

## 2019-07-09 DIAGNOSIS — Z029 Encounter for administrative examinations, unspecified: Secondary | ICD-10-CM

## 2019-07-09 LAB — POCT URINALYSIS DIPSTICK OB
Blood, UA: NEGATIVE
Glucose, UA: NEGATIVE
Ketones, UA: NEGATIVE
Leukocytes, UA: NEGATIVE
Nitrite, UA: NEGATIVE
POC,PROTEIN,UA: NEGATIVE

## 2019-07-09 NOTE — Patient Instructions (Addendum)
Jody Hansen, I greatly value your feedback.  If you receive a survey following your visit with [place] today, we appreciate you taking the time to fill it out.  Thanks, Jody Hansen, CNM, WHNP-BC  [facility] [place] at [facility] ([place], [place] [address]) Entrance C, located off of [address] parking   Go to [email or web address] to register for FREE online childbirth classes    Call the office [phone]) or go to [facility] if:  You begin to have strong, frequent contractions  Your water breaks.  Sometimes it is a big gush of fluid, sometimes it is just a trickle that keeps getting your panties wet or running down your legs  You have vaginal bleeding.  It is normal to have a small amount of spotting if your cervix was checked.   You don't feel your baby moving like normal.  If you don't, get you something to eat and drink and lay down and focus on feeling your baby move.  You should feel at least 10 movements in 2 hours.  If you don't, you should call the office or go to [facility].   Call the office [phone]) or go to [facility] hospital for these signs of pre-eclampsia:  Severe headache that does not go away with Tylenol  Visual changes- seeing spots, double, blurred vision  Pain under your right breast or upper abdomen that does not go away with Tums or heartburn medicine  Nausea and/or vomiting  Severe swelling in your hands, feet, and face   For your lower back pain you may:  Purchase a pregnancy/maternity support belt from [employer], [employer], [employer], [place], etc and wear it while you are up and about  Take warm baths  Use a heating pad to your lower back for no longer than 20 minutes at a time, and do not place near abdomen  Take tylenol as needed. Please follow directions on the bottle  Kinesiology tape (can get from sporting goods store), google how to tape belly for pregnancy     Home Blood Pressure  Monitoring for Patients   Your provider has recommended that you check your blood pressure (BP) at least once a week at home. If you do not have a blood pressure cuff at home, one will be provided for you. Contact your provider if you have not received your monitor within 1 week.   Helpful Tips for Accurate Home Blood Pressure Checks  . Don't smoke, exercise, or drink caffeine 30 minutes before checking your BP . Use the restroom before checking your BP (a full bladder can raise your pressure) . Relax in a comfortable upright chair . Feet on the ground . Left arm resting comfortably on a flat surface at the level of your heart . Legs uncrossed . Back supported . Sit quietly and don't talk . Place the cuff on your bare arm . Adjust snuggly, so that only two fingertips can fit between your skin and the top of the cuff . Check 2 readings separated by at least one minute . Keep a log of your BP readings . For a visual, please reference this diagram: http://ccnc.care/bpdiagram  Provider Name: [facility]     Phone: [phone]  Zone 1: ALL CLEAR  Continue to monitor your symptoms:  . BP reading is less than 140 (top number) or less than 90 (bottom number)  . No right upper stomach pain . No headaches or seeing spots .  No feeling nauseated or throwing up . No swelling in face and hands  Zone 2: CAUTION Call your doctor's office for any of the following:  . BP reading is greater than 140 (top number) or greater than 90 (bottom number)  . Stomach pain under your ribs in the middle or right side . Headaches or seeing spots . Feeling nauseated or throwing up . Swelling in face and hands  Zone 3: EMERGENCY  Seek immediate medical care if you have any of the following:  . BP reading is greater than160 (top number) or greater than 110 (bottom number) . Severe headaches not improving with Tylenol . Serious difficulty catching your breath . Any worsening symptoms from Zone 2    Preterm Labor and Birth Information  The normal length of a pregnancy is 39-41 weeks. Preterm labor is when labor starts before 37 completed weeks of pregnancy. What are the risk factors for preterm labor? Preterm labor is more likely to occur in women who:  Have certain infections during pregnancy such as a bladder infection, sexually transmitted infection, or infection inside the uterus (chorioamnionitis).  Have a shorter-than-normal cervix.  Have gone into preterm labor before.  Have had surgery on their cervix.  Are younger than age 42 or older than age 4.  Are African American.  Are pregnant with twins or multiple babies (multiple gestation).  Take street drugs or smoke while pregnant.  Do not gain enough weight while pregnant.  Became pregnant shortly after having been pregnant. What are the symptoms of preterm labor? Symptoms of preterm labor include:  Cramps similar to those that can happen during a menstrual period. The cramps may happen with diarrhea.  Pain in the abdomen or lower back.  Regular uterine contractions that may feel like tightening of the abdomen.  A feeling of increased pressure in the pelvis.  Increased watery or bloody mucus discharge from the vagina.  Water breaking (ruptured amniotic sac). Why is it important to recognize signs of preterm labor? It is important to recognize signs of preterm labor because babies who are born prematurely may not be fully developed. This can put them at an increased risk for:  Long-term (chronic) heart and lung problems.  Difficulty immediately after birth with regulating body systems, including blood sugar, body temperature, heart rate, and breathing rate.  Bleeding in the brain.  Cerebral palsy.  Learning difficulties.  Death. These risks are highest for babies who are born before 21 weeks of pregnancy. How is preterm labor treated? Treatment depends on the length of your pregnancy, your condition,  and the health of your baby. It may involve: 1. Having a stitch (suture) placed in your cervix to prevent your cervix from opening too early (cerclage). 2. Taking or being given medicines, such as: ? Hormone medicines. These may be given early in pregnancy to help support the pregnancy. ? Medicine to stop contractions. ? Medicines to help mature the baby's lungs. These may be prescribed if the risk of delivery is high. ? Medicines to prevent your baby from developing cerebral palsy. If the labor happens before 34 weeks of pregnancy, you may need to stay in the hospital. What should I do if I think I am in preterm labor? If you think that you are going into preterm labor, call your health care provider right away. How can I prevent preterm labor in future pregnancies? To increase your chance of having a full-term pregnancy:  Do not use any tobacco products, such as cigarettes,  chewing tobacco, and e-cigarettes. If you need help quitting, ask your health care provider.  Do not use street drugs or medicines that have not been prescribed to you during your pregnancy.  Talk with your health care provider before taking any herbal supplements, even if you have been taking them regularly.  Make sure you gain a healthy amount of weight during your pregnancy.  Watch for infection. If you think that you might have an infection, get it checked right away.  Make sure to tell your health care provider if you have gone into preterm labor before. This information is not intended to replace advice given to you by your health care provider. Make sure you discuss any questions you have with your health care provider. Document Revised: 06/15/2018 Document Reviewed: 07/15/2015 Elsevier Patient Education  [place].

## 2019-07-09 NOTE — Progress Notes (Signed)
LOW-RISK PREGNANCY VISIT Patient name: Jody Hansen MRN [id number]  Date of birth: Jul 09, 1995 Chief Complaint:   No chief complaint on file.  History of Present Illness:   Jody Hansen is a 24 y.o. G25P1011 female at [redacted]w[redacted]d with an Estimated Date of Delivery: 08/17/19 being seen today for ongoing management of a low-risk pregnancy.  Depression screen [facility] 2/9 02/11/2019 12/14/2018 07/11/2018 06/13/2017 06/02/2016  Decreased Interest 0 3 0 0 0  Down, Depressed, Hopeless 0 3 0 0 0  PHQ - 2 Score 0 6 0 0 0  Altered sleeping 3 3 - - -  Tired, decreased energy 2 3 - - -  Change in appetite 2 1 - - -  Feeling bad or failure about yourself  0 2 - - -  Trouble concentrating 0 1 - - -  Moving slowly or fidgety/restless 0 0 - - -  Suicidal thoughts 0 0 - - -  PHQ-9 Score 7 16 - - -    Today she reports pelvic pain/low back pain. Contractions: Not present. Vag. Bleeding: None.  Movement: Present. denies leaking of fluid. Review of Systems:   Pertinent items are noted in HPI Denies abnormal vaginal discharge w/ itching/odor/irritation, headaches, visual changes, shortness of breath, chest pain, abdominal pain, severe nausea/vomiting, or problems with urination or bowel movements unless otherwise stated above. Pertinent History Reviewed:  Reviewed past medical,surgical, social, obstetrical and family history.  Reviewed problem list, medications and allergies. Physical Assessment:   Vitals:   07/09/19 1021  BP: 110/68  Pulse: 82  Weight: 223 lb (101.2 kg)  Body mass index is 40.79 kg/m.        Physical Examination:   General appearance: Well appearing, and in no distress  Mental status: Alert, oriented to person, place, and time  Skin: Warm & dry  Cardiovascular: Normal heart rate noted  Respiratory: Normal respiratory effort, no distress  Abdomen: Soft, gravid, nontender  Pelvic: Cervical exam deferred         Extremities: Edema: None  Fetal Status: Fetal Heart Rate (bpm): 134  Fundal Height: 35 cm Movement: Present    Chaperone: n/a    Results for orders placed or performed in visit on 07/09/19 (from the past 24 hour(s))  POC Urinalysis Dipstick OB   Collection Time: 07/09/19 10:22 AM  Result Value Ref Range   Color, UA     Clarity, UA     Glucose, UA Negative Negative   Bilirubin, UA     Ketones, UA neg    Spec Grav, UA     Blood, UA neg    pH, UA     POC,PROTEIN,UA Negative Negative, Trace, Small (1+), Moderate (2+), Large (3+), 4+   Urobilinogen, UA     Nitrite, UA neg    Leukocytes, UA Negative Negative   Appearance     Odor      Assessment & Plan:  1) Low-risk pregnancy G3P1011 at [redacted]w[redacted]d with an Estimated Date of Delivery: 08/17/19   2) Pelvic/low back pain, gave printed prevention/relief measures    Meds: No orders of the defined types were placed in this encounter.  Labs/procedures today: none  Plan:  Continue routine obstetrical care  Next visit: prefers in person    Reviewed: Preterm labor symptoms and general obstetric precautions including but not limited to vaginal bleeding, contractions, leaking of fluid and fetal movement were reviewed in detail with the patient.  All questions were answered.  Follow-up: Return in about 2 weeks (around  07/23/2019) for LROB, in person, CNM.  Orders Placed This Encounter  Procedures  . POC Urinalysis Dipstick OB   Roma Schanz CNM, [facility] 07/09/2019 10:42 AM

## 2019-07-22 ENCOUNTER — Encounter: Payer: Self-pay | Admitting: Nurse Practitioner

## 2019-07-22 ENCOUNTER — Telehealth (INDEPENDENT_AMBULATORY_CARE_PROVIDER_SITE_OTHER): Payer: Medicaid Other | Admitting: Nurse Practitioner

## 2019-07-22 ENCOUNTER — Other Ambulatory Visit: Payer: Self-pay

## 2019-07-22 ENCOUNTER — Telehealth: Payer: Self-pay | Admitting: *Deleted

## 2019-07-22 DIAGNOSIS — J069 Acute upper respiratory infection, unspecified: Secondary | ICD-10-CM | POA: Diagnosis not present

## 2019-07-22 DIAGNOSIS — B9689 Other specified bacterial agents as the cause of diseases classified elsewhere: Secondary | ICD-10-CM

## 2019-07-22 MED ORDER — AMOXICILLIN 500 MG PO CAPS: 500.0000 mg | ORAL_CAPSULE | Freq: Three times a day (TID) | ORAL | 0 refills | Status: DC

## 2019-07-22 NOTE — Progress Notes (Signed)
   Subjective:    Patient ID: Jody Hansen, female    DOB: [birth year], 24 y.o.   MRN: [id number]  Cough This is a new problem. The current episode started in the past 7 days. Associated symptoms include nasal congestion and a sore throat. Associated symptoms comments: Upset stomach.  Began about a week ago. Having head congestion. PND.  Producing some thick clear mucus with occasional slight streaks of blood.  Has been having some "stomach ache" feeling slightly sick with nausea, no vomiting.  Some regurgitation.  Denies any reflux.  No fever headache or ear pain.  Sore throat has improved.  Minimal cough.  No wheezing.  Taking fluids well but has noticed her lips and throat feel dry.  Voiding normal limit.  Decreased appetite.  No change in her taste or smell.  Has not been tested for Covid.  Had a few irregular contractions especially at night.  States she can feel the baby kicking and moving.  Has an appointment with her obstetrician tomorrow.  [redacted] weeks pregnant Daughter and fiance were also sick but they have gotten better   Review of Systems  HENT: Positive for sore throat.   Respiratory: Positive for cough.    Virtual Visit via Video Note  I connected with Jody Hansen on 07/22/19 at  9:00 AM EDT by a video enabled telemedicine application and verified that I am speaking with the correct person using two identifiers.  Location: Patient: home Provider: office   I discussed the limitations of evaluation and management by telemedicine and the availability of in person appointments. The patient expressed understanding and agreed to proceed.  History of Present Illness: See above   Observations/Objective: Today's visit was via telephone Physical exam was not possible for this visit Alert, oriented.  Calm affect.  Thoughts logical coherent and relevant.  Assessment and Plan: Bacterial upper respiratory infection  Meds ordered this encounter  Medications  .  amoxicillin (AMOXIL) 500 MG capsule    Sig: Take 1 capsule (500 mg total) by mouth 3 (three) times daily.    Dispense:  30 capsule    Refill:  0     Follow Up Instructions: Increase fluid intake.  If contractions worsen or if any problems go to ED or contact her obstetrician immediately.  Follow-up tomorrow as planned.  Also consult with your office regarding medications they want her to take for congestion during the third trimester.  Call back if symptoms worsen or persist.   I discussed the assessment and treatment plan with the patient. The patient was provided an opportunity to ask questions and all were answered. The patient agreed with the plan and demonstrated an understanding of the instructions.   The patient was advised to call back or seek an in-person evaluation if the symptoms worsen or if the condition fails to improve as anticipated.  I provided 15 minutes of non-face-to-face time during this encounter.        Objective:   Physical Exam        Assessment & Plan:

## 2019-07-22 NOTE — Telephone Encounter (Signed)
Ms. morgandy, bussie are scheduled for a virtual visit with your provider today.    Just as we do with appointments in the office, we must obtain your consent to participate.  Your consent will be active for this visit and any virtual visit you may have with one of our providers in the next 365 days.    If you have a MyChart account, I can also send a copy of this consent to you electronically.  All virtual visits are billed to your insurance company just like a traditional visit in the office.  As this is a virtual visit, video technology does not allow for your provider to perform a traditional examination.  This may limit your provider's ability to fully assess your condition.  If your provider identifies any concerns that need to be evaluated in person or the need to arrange testing such as labs, EKG, etc, we will make arrangements to do so.    Although advances in technology are sophisticated, we cannot ensure that it will always work on either your end or our end.  If the connection with a video visit is poor, we may have to switch to a telephone visit.  With either a video or telephone visit, we are not always able to ensure that we have a secure connection.   I need to obtain your verbal consent now.   Are you willing to proceed with your visit today?   MARKEE MCLENDON has provided verbal consent on 07/22/2019 for a virtual visit (video or telephone).   Mitzie Na, RN 07/22/2019  8:59 AM

## 2019-07-23 ENCOUNTER — Other Ambulatory Visit (HOSPITAL_COMMUNITY)
Admission: RE | Admit: 2019-07-23 | Discharge: 2019-07-23 | Disposition: A | Discharge status: Home / Self Care | Payer: Medicaid Other | Source: Ambulatory Visit | Attending: Obstetrics and Gynecology | Admitting: Obstetrics and Gynecology

## 2019-07-23 ENCOUNTER — Ambulatory Visit (INDEPENDENT_AMBULATORY_CARE_PROVIDER_SITE_OTHER): Payer: Medicaid Other | Admitting: Women's Health

## 2019-07-23 ENCOUNTER — Encounter: Payer: Self-pay | Admitting: Women's Health

## 2019-07-23 VITALS — BP 111/73 | HR 81 | Wt 228.4 lb

## 2019-07-23 DIAGNOSIS — Z1389 Encounter for screening for other disorder: Secondary | ICD-10-CM

## 2019-07-23 DIAGNOSIS — Z331 Pregnant state, incidental: Secondary | ICD-10-CM

## 2019-07-23 DIAGNOSIS — Z3483 Encounter for supervision of other normal pregnancy, third trimester: Secondary | ICD-10-CM

## 2019-07-23 DIAGNOSIS — Z3A36 36 weeks gestation of pregnancy: Secondary | ICD-10-CM | POA: Diagnosis not present

## 2019-07-23 DIAGNOSIS — Z113 Encounter for screening for infections with a predominantly sexual mode of transmission: Secondary | ICD-10-CM

## 2019-07-23 LAB — POCT URINALYSIS DIPSTICK OB
Blood, UA: NEGATIVE
Glucose, UA: NEGATIVE
Ketones, UA: NEGATIVE
Leukocytes, UA: NEGATIVE
Nitrite, UA: NEGATIVE
POC,PROTEIN,UA: NEGATIVE

## 2019-07-23 NOTE — Progress Notes (Signed)
LOW-RISK PREGNANCY VISIT Patient name: Jody Hansen MRN [id number]  Date of birth: 04-17-1995 Chief Complaint:   Routine Prenatal Visit  History of Present Illness:   Jody Hansen is a 24 y.o. G14P1011 female at [redacted]w[redacted]d with an Estimated Date of Delivery: 08/17/19 being seen today for ongoing management of a low-risk pregnancy.  Depression screen [facility] 2/9 02/11/2019 12/14/2018 07/11/2018 06/13/2017 06/02/2016  Decreased Interest 0 3 0 0 0  Down, Depressed, Hopeless 0 3 0 0 0  PHQ - 2 Score 0 6 0 0 0  Altered sleeping 3 3 - - -  Tired, decreased energy 2 3 - - -  Change in appetite 2 1 - - -  Feeling bad or failure about yourself  0 2 - - -  Trouble concentrating 0 1 - - -  Moving slowly or fidgety/restless 0 0 - - -  Suicidal thoughts 0 0 - - -  PHQ-9 Score 7 16 - - -    Today she reports contractions at night, occ RLQ pain. Contractions: Irregular. Vag. Bleeding: None.  Movement: Present. denies leaking of fluid. Review of Systems:   Pertinent items are noted in HPI Denies abnormal vaginal discharge w/ itching/odor/irritation, headaches, visual changes, shortness of breath, chest pain, abdominal pain, severe nausea/vomiting, or problems with urination or bowel movements unless otherwise stated above. Pertinent History Reviewed:  Reviewed past medical,surgical, social, obstetrical and family history.  Reviewed problem list, medications and allergies. Physical Assessment:   Vitals:   07/23/19 0833  BP: 111/73  Pulse: 81  Weight: 228 lb 6.4 oz (103.6 kg)  Body mass index is 41.77 kg/m.        Physical Examination:   General appearance: Well appearing, and in no distress  Mental status: Alert, oriented to person, place, and time  Skin: Warm & dry  Cardiovascular: Normal heart rate noted  Respiratory: Normal respiratory effort, no distress  Abdomen: Soft, gravid, nontender  Pelvic: Cervical exam performed  Dilation: 1.5 Effacement (%): 50 Station: -3  Extremities: Edema:  None  Fetal Status: Fetal Heart Rate (bpm): 140 Fundal Height: 36 cm Movement: Present Presentation: Vertex  Chaperone: [place]    Results for orders placed or performed in visit on 07/23/19 (from the past 24 hour(s))  POC Urinalysis Dipstick OB   Collection Time: 07/23/19  8:31 AM  Result Value Ref Range   Color, UA     Clarity, UA     Glucose, UA Negative Negative   Bilirubin, UA     Ketones, UA n    Spec Grav, UA     Blood, UA n    pH, UA     POC,PROTEIN,UA Negative Negative, Trace, Small (1+), Moderate (2+), Large (3+), 4+   Urobilinogen, UA     Nitrite, UA n    Leukocytes, UA Negative Negative   Appearance     Odor      Assessment & Plan:  1) Low-risk pregnancy G3P1011 at [redacted]w[redacted]d with an Estimated Date of Delivery: 08/17/19    Meds: No orders of the defined types were placed in this encounter.  Labs/procedures today: gbs, gc/ct, sve  Plan:  Continue routine obstetrical care  Next visit: prefers in person    Reviewed: Preterm labor symptoms and general obstetric precautions including but not limited to vaginal bleeding, contractions, leaking of fluid and fetal movement were reviewed in detail with the patient.  All questions were answered.  Follow-up: Return in about 1 week (around 07/30/2019) for [place], [place],  in person.  Orders Placed This Encounter  Procedures  . Strep Gp B NAA  . POC Urinalysis Dipstick OB   Roma Schanz CNM, [facility] 07/23/2019 9:10 AM

## 2019-07-23 NOTE — Patient Instructions (Signed)
Jody Hansen, I greatly value your feedback.  If you receive a survey following your visit with [place] today, we appreciate you taking the time to fill it out.  Thanks, [name], CNM, WHNP-BC  [facility] at [facility] ([place], [place] [address]) Entrance C, located off of [address]   Go to [email or web address] to register for FREE online childbirth classes    Call the office [phone]) or go to [facility] if:  You begin to have strong, frequent contractions  Your water breaks.  Sometimes it is a big gush of fluid, sometimes it is just a trickle that keeps getting your panties wet or running down your legs  You have vaginal bleeding.  It is normal to have a small amount of spotting if your cervix was checked.   You don't feel your baby moving like normal.  If you don't, get you something to eat and drink and lay down and focus on feeling your baby move.  You should feel at least 10 movements in 2 hours.  If you don't, you should call the office or go to [facility].   Call the office [phone]) or go to [facility] hospital for these signs of pre-eclampsia:  Severe headache that does not go away with Tylenol  Visual changes- seeing spots, double, blurred vision  Pain under your right breast or upper abdomen that does not go away with Tums or heartburn medicine  Nausea and/or vomiting  Severe swelling in your hands, feet, and face    Home Blood Pressure Monitoring for Patients   Your provider has recommended that you check your blood pressure (BP) at least once a week at home. If you do not have a blood pressure cuff at home, one will be provided for you. Contact your provider if you have not received your monitor within 1 week.   Helpful Tips for Accurate Home Blood Pressure Checks  . Don't smoke, exercise, or drink caffeine 30 minutes before checking your BP . Use the restroom before checking your BP (a full  bladder can raise your pressure) . Relax in a comfortable upright chair . Feet on the ground . Left arm resting comfortably on a flat surface at the level of your heart . Legs uncrossed . Back supported . Sit quietly and don't talk . Place the cuff on your bare arm . Adjust snuggly, so that only two fingertips can fit between your skin and the top of the cuff . Check 2 readings separated by at least one minute . Keep a log of your BP readings . For a visual, please reference this diagram: http://ccnc.care/bpdiagram  Provider Name: [facility]     Phone: [phone]  Zone 1: ALL CLEAR  Continue to monitor your symptoms:  . BP reading is less than 140 (top number) or less than 90 (bottom number)  . No right upper stomach pain . No headaches or seeing spots . No feeling nauseated or throwing up . No swelling in face and hands  Zone 2: CAUTION Call your doctor's office for any of the following:  . BP reading is greater than 140 (top number) or greater than 90 (bottom number)  . Stomach pain under your ribs in the middle or right side . Headaches or seeing spots . Feeling nauseated or throwing up . Swelling in face and hands  Zone 3: EMERGENCY  Seek immediate medical care if you have any of  the following:  . BP reading is greater than160 (top number) or greater than 110 (bottom number) . Severe headaches not improving with Tylenol . Serious difficulty catching your breath . Any worsening symptoms from Zone 2   Braxton Hicks Contractions Contractions of the uterus can occur throughout pregnancy, but they are not always a sign that you are in labor. You may have practice contractions called Braxton Hicks contractions. These false labor contractions are sometimes confused with true labor. What are [place] contractions? Braxton Hicks contractions are tightening movements that occur in the muscles of the uterus before labor. Unlike true labor contractions, these  contractions do not result in opening (dilation) and thinning of the cervix. Toward the end of pregnancy (32-34 weeks), Braxton Hicks contractions can happen more often and may become stronger. These contractions are sometimes difficult to tell apart from true labor because they can be very uncomfortable. You should not feel embarrassed if you go to the hospital with false labor. Sometimes, the only way to tell if you are in true labor is for your health care provider to look for changes in the cervix. The health care provider will do a physical exam and may monitor your contractions. If you are not in true labor, the exam should show that your cervix is not dilating and your water has not broken. If there are no other health problems associated with your pregnancy, it is completely safe for you to be sent home with false labor. You may continue to have Braxton Hicks contractions until you go into true labor. How to tell the difference between true labor and false labor True labor  Contractions last 30-70 seconds.  Contractions become very regular.  Discomfort is usually felt in the top of the uterus, and it spreads to the lower abdomen and low back.  Contractions do not go away with walking.  Contractions usually become more intense and increase in frequency.  The cervix dilates and gets thinner. False labor  Contractions are usually shorter and not as strong as true labor contractions.  Contractions are usually irregular.  Contractions are often felt in the front of the lower abdomen and in the groin.  Contractions may go away when you walk around or change positions while lying down.  Contractions get weaker and are shorter-lasting as time goes on.  The cervix usually does not dilate or become thin. Follow these instructions at home:  1. Take over-the-counter and prescription medicines only as told by your health care provider. 2. Keep up with your usual exercises and follow other  instructions from your health care provider. 3. Eat and drink lightly if you think you are going into labor. 4. If Braxton Hicks contractions are making you uncomfortable: ? Change your position from lying down or resting to walking, or change from walking to resting. ? Sit and rest in a tub of warm water. ? Drink enough fluid to keep your urine pale yellow. Dehydration may cause these contractions. ? Do slow and deep breathing several times an hour. 5. Keep all follow-up prenatal visits as told by your health care provider. This is important. Contact a health care provider if:  You have a fever.  You have continuous pain in your abdomen. Get help right away if:  Your contractions become stronger, more regular, and closer together.  You have fluid leaking or gushing from your vagina.  You pass blood-tinged mucus (bloody show).  You have bleeding from your vagina.  You have low back  pain that you never had before.  You feel your baby's head pushing down and causing pelvic pressure.  Your baby is not moving inside you as much as it used to. Summary  Contractions that occur before labor are called Braxton Hicks contractions, false labor, or practice contractions.  Braxton Hicks contractions are usually shorter, weaker, farther apart, and less regular than true labor contractions. True labor contractions usually become progressively stronger and regular, and they become more frequent.  Manage discomfort from [facility] contractions by changing position, resting in a warm bath, drinking plenty of water, or practicing deep breathing. This information is not intended to replace advice given to you by your health care provider. Make sure you discuss any questions you have with your health care provider. Document Revised: 02/03/2017 Document Reviewed: 07/07/2016 Elsevier Patient Education  [place].

## 2019-07-24 LAB — CERVICOVAGINAL ANCILLARY ONLY
Chlamydia: NEGATIVE
Comment: NEGATIVE
Comment: NORMAL
Neisseria Gonorrhea: NEGATIVE

## 2019-07-25 LAB — STREP GP B NAA: Strep Gp B NAA: NEGATIVE

## 2019-07-30 ENCOUNTER — Encounter: Payer: Self-pay | Admitting: Obstetrics & Gynecology

## 2019-07-30 ENCOUNTER — Ambulatory Visit (INDEPENDENT_AMBULATORY_CARE_PROVIDER_SITE_OTHER): Payer: Medicaid Other | Admitting: Obstetrics & Gynecology

## 2019-07-30 VITALS — BP 131/80 | HR 92 | Wt 231.0 lb

## 2019-07-30 DIAGNOSIS — Z3483 Encounter for supervision of other normal pregnancy, third trimester: Secondary | ICD-10-CM

## 2019-07-30 DIAGNOSIS — Z3A37 37 weeks gestation of pregnancy: Secondary | ICD-10-CM

## 2019-07-30 NOTE — Progress Notes (Signed)
   LOW-RISK PREGNANCY VISIT Patient name: Jody Hansen MRN [id number]  Date of birth: March 01, 1996 Chief Complaint:   Routine Prenatal Visit  History of Present Illness:   Jody Hansen is a 24 y.o. G3P1011 female at [redacted]w[redacted]d with an Estimated Date of Delivery: 08/17/19 being seen today for ongoing management of a low-risk pregnancy.  Depression screen [facility] 2/9 02/11/2019 12/14/2018 07/11/2018 06/13/2017 06/02/2016  Decreased Interest 0 3 0 0 0  Down, Depressed, Hopeless 0 3 0 0 0  PHQ - 2 Score 0 6 0 0 0  Altered sleeping 3 3 - - -  Tired, decreased energy 2 3 - - -  Change in appetite 2 1 - - -  Feeling bad or failure about yourself  0 2 - - -  Trouble concentrating 0 1 - - -  Moving slowly or fidgety/restless 0 0 - - -  Suicidal thoughts 0 0 - - -  PHQ-9 Score 7 16 - - -    Today she reports no complaints. Contractions: Irregular. Vag. Bleeding: None.  Movement: Present. denies leaking of fluid. Review of Systems:   Pertinent items are noted in HPI Denies abnormal vaginal discharge w/ itching/odor/irritation, headaches, visual changes, shortness of breath, chest pain, abdominal pain, severe nausea/vomiting, or problems with urination or bowel movements unless otherwise stated above. Pertinent History Reviewed:  Reviewed past medical,surgical, social, obstetrical and family history.  Reviewed problem list, medications and allergies. Physical Assessment:   Vitals:   07/30/19 1527  BP: 131/80  Pulse: 92  Weight: 231 lb (104.8 kg)  Body mass index is 42.25 kg/m.        Physical Examination:   General appearance: Well appearing, and in no distress  Mental status: Alert, oriented to person, place, and time  Skin: Warm & dry  Cardiovascular: Normal heart rate noted  Respiratory: Normal respiratory effort, no distress  Abdomen: Soft, gravid, nontender  Pelvic: Cervical exam deferred  Dilation: 2.5 Effacement (%): 50 Station: -3  Extremities: Edema: None  Fetal Status: Fetal  Heart Rate (bpm): 122 Fundal Height: 39 cm Movement: Present Presentation: Vertex  Chaperone: Amanda Rash    No results found for this or any previous visit (from the past 24 hour(s)).  Assessment & Plan:  1) Low-risk pregnancy G3P1011 at [redacted]w[redacted]d with an Estimated Date of Delivery: 08/17/19      Meds: No orders of the defined types were placed in this encounter.  Labs/procedures today:   Plan:  Continue routine obstetrical care  Next visit: prefers in person    Reviewed: Term labor symptoms and general obstetric precautions including but not limited to vaginal bleeding, contractions, leaking of fluid and fetal movement were reviewed in detail with the patient.  All questions were answered. Has home bp cuff. Rx faxed to . Check bp weekly, let us know if >140/90.   Follow-up: Return in about 1 week (around 08/06/2019) for [place].  No orders of the defined types were placed in this encounter.  Florian Buff  07/30/2019 4:29 PM

## 2019-08-03 ENCOUNTER — Other Ambulatory Visit: Payer: Self-pay

## 2019-08-03 ENCOUNTER — Inpatient Hospital Stay (EMERGENCY_DEPARTMENT_HOSPITAL)
Admission: AD | Admit: 2019-08-03 | Discharge: 2019-08-04 | Disposition: A | Discharge status: Home / Self Care | Payer: Medicaid Other | Source: Ambulatory Visit | Attending: Obstetrics and Gynecology | Admitting: Obstetrics and Gynecology

## 2019-08-03 DIAGNOSIS — O479 False labor, unspecified: Secondary | ICD-10-CM

## 2019-08-03 DIAGNOSIS — Z3A38 38 weeks gestation of pregnancy: Secondary | ICD-10-CM | POA: Insufficient documentation

## 2019-08-03 DIAGNOSIS — O471 False labor at or after 37 completed weeks of gestation: Secondary | ICD-10-CM | POA: Insufficient documentation

## 2019-08-04 ENCOUNTER — Encounter (HOSPITAL_COMMUNITY): Payer: Self-pay | Admitting: Obstetrics and Gynecology

## 2019-08-04 DIAGNOSIS — Z3A38 38 weeks gestation of pregnancy: Secondary | ICD-10-CM | POA: Diagnosis not present

## 2019-08-04 DIAGNOSIS — O471 False labor at or after 37 completed weeks of gestation: Secondary | ICD-10-CM | POA: Diagnosis not present

## 2019-08-04 NOTE — MAU Provider Note (Signed)
S: Ms. LAWSYN MAGADAN is a 24 y.o. G3P1011 at [redacted]w[redacted]d  who presents to MAU today for labor evaluation.     Cervical exam by RN:  Dilation: 3 Effacement (%): 50 Cervical Position: Posterior Station: Ballotable Presentation: Vertex Exam by:: Glena Norfolk, RN No change after one hour  Fetal Monitoring: Baseline: 145 Variability: average Accelerations: present Decelerations: absent Contractions: q 9-10 min  MDM Discussed patient with RN. NST reviewed.   A: SIUP at [redacted]w[redacted]d  False labor  P: Discharge home Labor precautions and kick counts included in AVS Patient to follow-up with office as scheduled  Patient may return to MAU as needed or when in labor   Seabron Spates, [place] 08/04/2019 1:34 AM

## 2019-08-04 NOTE — MAU Note (Signed)
Pt reports contractions that started tonight, now every 5 mins for the past hour. She denies LOF or vaginal bleeding. Reports good fetal movement. Cervix 2.5cm on Tuesday.

## 2019-08-04 NOTE — Discharge Instructions (Signed)

## 2019-08-05 ENCOUNTER — Inpatient Hospital Stay (HOSPITAL_COMMUNITY): Payer: Medicaid Other | Admitting: Anesthesiology

## 2019-08-05 ENCOUNTER — Other Ambulatory Visit: Payer: Self-pay

## 2019-08-05 ENCOUNTER — Inpatient Hospital Stay (HOSPITAL_COMMUNITY)
Admission: AD | Admit: 2019-08-05 | Discharge: 2019-08-07 | DRG: 805 | Disposition: A | Discharge status: Home / Self Care | Payer: Medicaid Other | Attending: Obstetrics and Gynecology | Admitting: Obstetrics and Gynecology

## 2019-08-05 ENCOUNTER — Encounter (HOSPITAL_COMMUNITY): Payer: Self-pay | Admitting: Obstetrics and Gynecology

## 2019-08-05 DIAGNOSIS — O9962 Diseases of the digestive system complicating childbirth: Secondary | ICD-10-CM | POA: Diagnosis not present

## 2019-08-05 DIAGNOSIS — Z3A38 38 weeks gestation of pregnancy: Secondary | ICD-10-CM | POA: Diagnosis not present

## 2019-08-05 DIAGNOSIS — U071 COVID-19: Secondary | ICD-10-CM | POA: Diagnosis present

## 2019-08-05 DIAGNOSIS — O99214 Obesity complicating childbirth: Secondary | ICD-10-CM | POA: Diagnosis present

## 2019-08-05 DIAGNOSIS — Z3483 Encounter for supervision of other normal pregnancy, third trimester: Secondary | ICD-10-CM

## 2019-08-05 DIAGNOSIS — O26893 Other specified pregnancy related conditions, third trimester: Secondary | ICD-10-CM | POA: Diagnosis present

## 2019-08-05 DIAGNOSIS — K219 Gastro-esophageal reflux disease without esophagitis: Secondary | ICD-10-CM | POA: Diagnosis not present

## 2019-08-05 DIAGNOSIS — O9852 Other viral diseases complicating childbirth: Secondary | ICD-10-CM | POA: Diagnosis present

## 2019-08-05 LAB — URINALYSIS, ROUTINE W REFLEX MICROSCOPIC
Bilirubin Urine: NEGATIVE
Glucose, UA: NEGATIVE mg/dL
Ketones, ur: 5 mg/dL — AB
Nitrite: NEGATIVE
Protein, ur: 100 mg/dL — AB
RBC / HPF: >50 RBC/hpf — ABNORMAL HIGH (ref 0–5)
Specific Gravity, Urine: 1.024 (ref 1.005–1.030)
WBC, UA: >50 WBC/hpf — ABNORMAL HIGH (ref 0–5)
pH: 5 (ref 5.0–8.0)

## 2019-08-05 LAB — SARS CORONAVIRUS 2 (TAT 6-24 HRS): SARS Coronavirus 2: POSITIVE — AB

## 2019-08-05 LAB — CBC
HCT: 35.5 % — ABNORMAL LOW (ref 36.0–46.0)
Hemoglobin: 11.8 g/dL — ABNORMAL LOW (ref 12.0–15.0)
MCH: 30.4 pg (ref 26.0–34.0)
MCHC: 33.2 g/dL (ref 30.0–36.0)
MCV: 91.5 fL (ref 80.0–100.0)
Platelets: 218 10*3/uL (ref 150–400)
RBC: 3.88 MIL/uL (ref 3.87–5.11)
RDW: 13 % (ref 11.5–15.5)
WBC: 12.5 10*3/uL — ABNORMAL HIGH (ref 4.0–10.5)
nRBC: 0 % (ref 0.0–0.2)

## 2019-08-05 LAB — TYPE AND SCREEN
ABO/RH(D): A POS
Antibody Screen: NEGATIVE

## 2019-08-05 LAB — ABO/RH: ABO/RH(D): A POS

## 2019-08-05 MED ORDER — FLEET ENEMA 7-19 GM/118ML RE ENEM: 1.0000 | ENEMA | RECTAL | Status: DC | PRN

## 2019-08-05 MED ORDER — FENTANYL CITRATE (PF) 100 MCG/2ML IJ SOLN
INTRAMUSCULAR | Status: AC
  Filled 2019-08-05: qty 2

## 2019-08-05 MED ORDER — PHENYLEPHRINE 40 MCG/ML (10ML) SYRINGE FOR IV PUSH (FOR BLOOD PRESSURE SUPPORT): 80.0000 ug | PREFILLED_SYRINGE | INTRAVENOUS | Status: DC | PRN

## 2019-08-05 MED ORDER — ACETAMINOPHEN 325 MG PO TABS
650.0000 mg | ORAL_TABLET | ORAL | Status: DC | PRN
  Administered 2019-08-05: 650 mg via ORAL
  Filled 2019-08-05: qty 2

## 2019-08-05 MED ORDER — TETANUS-DIPHTH-ACELL PERTUSSIS 5-2.5-18.5 LF-MCG/0.5 IM SUSP: 0.5000 mL | Freq: Once | INTRAMUSCULAR | Status: DC

## 2019-08-05 MED ORDER — LACTATED RINGERS IV SOLN: 500.0000 mL | Freq: Once | INTRAVENOUS | Status: DC

## 2019-08-05 MED ORDER — OXYTOCIN BOLUS FROM INFUSION
500.0000 mL | Freq: Once | INTRAVENOUS | Status: AC
  Administered 2019-08-05: 500 mL via INTRAVENOUS

## 2019-08-05 MED ORDER — ZOLPIDEM TARTRATE 5 MG PO TABS: 5.0000 mg | ORAL_TABLET | Freq: Every evening | ORAL | Status: DC | PRN

## 2019-08-05 MED ORDER — FENTANYL CITRATE (PF) 100 MCG/2ML IJ SOLN
100.0000 ug | Freq: Once | INTRAMUSCULAR | Status: AC
  Administered 2019-08-05: 100 ug via INTRAVENOUS

## 2019-08-05 MED ORDER — PROMETHAZINE HCL 25 MG/ML IJ SOLN
12.5000 mg | Freq: Once | INTRAMUSCULAR | Status: AC
  Administered 2019-08-05: 12.5 mg via INTRAMUSCULAR
  Filled 2019-08-05: qty 1

## 2019-08-05 MED ORDER — DIBUCAINE (PERIANAL) 1 % EX OINT: 1.0000 "application " | TOPICAL_OINTMENT | CUTANEOUS | Status: DC | PRN

## 2019-08-05 MED ORDER — ACETAMINOPHEN 325 MG PO TABS: 650.0000 mg | ORAL_TABLET | ORAL | Status: DC | PRN

## 2019-08-05 MED ORDER — MORPHINE SULFATE (PF) 4 MG/ML IV SOLN
2.0000 mg | Freq: Once | INTRAVENOUS | Status: AC
  Administered 2019-08-05: 2 mg via INTRAMUSCULAR
  Filled 2019-08-05: qty 1

## 2019-08-05 MED ORDER — IBUPROFEN 600 MG PO TABS
600.0000 mg | ORAL_TABLET | Freq: Four times a day (QID) | ORAL | Status: DC
  Administered 2019-08-05 – 2019-08-07 (×7): 600 mg via ORAL
  Filled 2019-08-05 (×7): qty 1

## 2019-08-05 MED ORDER — LIDOCAINE HCL (PF) 1 % IJ SOLN: 30.0000 mL | INTRAMUSCULAR | Status: DC | PRN

## 2019-08-05 MED ORDER — EPHEDRINE 5 MG/ML INJ: 10.0000 mg | INTRAVENOUS | Status: DC | PRN

## 2019-08-05 MED ORDER — LACTATED RINGERS IV SOLN: INTRAVENOUS | Status: DC

## 2019-08-05 MED ORDER — BENZOCAINE-MENTHOL 20-0.5 % EX AERO: 1.0000 "application " | INHALATION_SPRAY | CUTANEOUS | Status: DC | PRN

## 2019-08-05 MED ORDER — DIPHENHYDRAMINE HCL 50 MG/ML IJ SOLN: 12.5000 mg | INTRAMUSCULAR | Status: DC | PRN

## 2019-08-05 MED ORDER — SENNOSIDES-DOCUSATE SODIUM 8.6-50 MG PO TABS
2.0000 | ORAL_TABLET | ORAL | Status: DC
  Filled 2019-08-05 (×2): qty 2

## 2019-08-05 MED ORDER — ONDANSETRON HCL 4 MG/2ML IJ SOLN: 4.0000 mg | Freq: Four times a day (QID) | INTRAMUSCULAR | Status: DC | PRN

## 2019-08-05 MED ORDER — PHENYLEPHRINE 40 MCG/ML (10ML) SYRINGE FOR IV PUSH (FOR BLOOD PRESSURE SUPPORT)
80.0000 ug | PREFILLED_SYRINGE | INTRAVENOUS | Status: DC | PRN
  Filled 2019-08-05: qty 10

## 2019-08-05 MED ORDER — OXYCODONE-ACETAMINOPHEN 5-325 MG PO TABS: 2.0000 | ORAL_TABLET | ORAL | Status: DC | PRN

## 2019-08-05 MED ORDER — OXYTOCIN 40 UNITS IN NORMAL SALINE INFUSION - SIMPLE MED
INTRAVENOUS | Status: AC
  Filled 2019-08-05: qty 1000

## 2019-08-05 MED ORDER — ONDANSETRON HCL 4 MG PO TABS: 4.0000 mg | ORAL_TABLET | ORAL | Status: DC | PRN

## 2019-08-05 MED ORDER — SIMETHICONE 80 MG PO CHEW: 80.0000 mg | CHEWABLE_TABLET | ORAL | Status: DC | PRN

## 2019-08-05 MED ORDER — LACTATED RINGERS IV SOLN: 500.0000 mL | INTRAVENOUS | Status: DC | PRN

## 2019-08-05 MED ORDER — FENTANYL-BUPIVACAINE-NACL 0.5-0.125-0.9 MG/250ML-% EP SOLN
12.0000 mL/h | EPIDURAL | Status: DC | PRN
  Filled 2019-08-05: qty 250

## 2019-08-05 MED ORDER — SODIUM CHLORIDE (PF) 0.9 % IJ SOLN
INTRAMUSCULAR | Status: DC | PRN
  Administered 2019-08-05: 12 mL/h via EPIDURAL

## 2019-08-05 MED ORDER — LIDOCAINE HCL (PF) 1 % IJ SOLN
INTRAMUSCULAR | Status: DC | PRN
  Administered 2019-08-05: 11 mL via EPIDURAL

## 2019-08-05 MED ORDER — TERBUTALINE SULFATE 1 MG/ML IJ SOLN: 0.2500 mg | Freq: Once | INTRAMUSCULAR | Status: DC | PRN

## 2019-08-05 MED ORDER — SOD CITRATE-CITRIC ACID 500-334 MG/5ML PO SOLN: 30.0000 mL | ORAL | Status: DC | PRN

## 2019-08-05 MED ORDER — DIPHENHYDRAMINE HCL 25 MG PO CAPS: 25.0000 mg | ORAL_CAPSULE | Freq: Four times a day (QID) | ORAL | Status: DC | PRN

## 2019-08-05 MED ORDER — WITCH HAZEL-GLYCERIN EX PADS: 1.0000 "application " | MEDICATED_PAD | CUTANEOUS | Status: DC | PRN

## 2019-08-05 MED ORDER — COCONUT OIL OIL: 1.0000 "application " | TOPICAL_OIL | Status: DC | PRN

## 2019-08-05 MED ORDER — ONDANSETRON HCL 4 MG/2ML IJ SOLN: 4.0000 mg | INTRAMUSCULAR | Status: DC | PRN

## 2019-08-05 MED ORDER — PRENATAL MULTIVITAMIN CH
1.0000 | ORAL_TABLET | Freq: Every day | ORAL | Status: DC
  Administered 2019-08-06 – 2019-08-07 (×2): 1 via ORAL
  Filled 2019-08-05 (×2): qty 1

## 2019-08-05 MED ORDER — OXYTOCIN 40 UNITS IN NORMAL SALINE INFUSION - SIMPLE MED
1.0000 m[IU]/min | INTRAVENOUS | Status: DC
  Administered 2019-08-05: 2 m[IU]/min via INTRAVENOUS

## 2019-08-05 MED ORDER — OXYTOCIN 40 UNITS IN NORMAL SALINE INFUSION - SIMPLE MED
2.5000 [IU]/h | INTRAVENOUS | Status: DC
  Administered 2019-08-05: 2.5 [IU]/h via INTRAVENOUS

## 2019-08-05 MED ORDER — OXYCODONE-ACETAMINOPHEN 5-325 MG PO TABS: 1.0000 | ORAL_TABLET | ORAL | Status: DC | PRN

## 2019-08-05 NOTE — Consult Note (Signed)
Neonatology Note:   Attendance at Delivery:    I was asked by Dr. Glo Herring to attend this vaginal delviery at term for vacuum assist with shoulder dystocia. The mother is a G3P1011, GBS neg with good prenatal care complicated by Covid + [id number]. ROM 4h 95m prior to delivery, fluid clear. ~35min shoulder dystocia.  Infant vigorous with fairly good spontaneous cry and tone.  Significant peripheral vasoconstriction. Cord cut after ~30 sec and baby brought to warmer and dried and stimulated.  HR >100.  +cough and small amount fluid bulb suctioned from nares and mouth.  BBO2 given.  Stronger crying and regular and appropriate respirations ensued. Quickly pinked up.  Sao2 placed in mid 90s.  BBO2 removed and baby vitals remained stable and appropriate.  No WOB. Clavicles intact.  Spontaneous movement of both upper extremities.  Cephalhematoma. Alert, active.  Apgars 5/9.  Cord gas 7.12/66.  Introduced to father, family updated.  To CN to care of Pediatrician.  Monia Sabal Katherina Mires, MD

## 2019-08-05 NOTE — MAU Note (Signed)
Presents with ctxs every 5 minutes since 0600 this morning.  Denies LOF, reports bloody show.  Endorses +FM.

## 2019-08-05 NOTE — Anesthesia Procedure Notes (Signed)
Epidural Patient location during procedure: OB Start time: 08/05/2019 12:48 PM End time: 08/05/2019 12:55 PM  Staffing Anesthesiologist: Lynda Rainwater, MD Performed: anesthesiologist   Preanesthetic Checklist Completed: patient identified, IV checked, site marked, risks and benefits discussed, surgical consent, monitors and equipment checked, pre-op evaluation and timeout performed  Epidural Patient position: sitting Prep: ChloraPrep Patient monitoring: heart rate, cardiac monitor, continuous pulse ox and blood pressure Approach: midline Location: L2-L3 Injection technique: LOR saline  Needle:  Needle type: Tuohy  Needle gauge: 17 G Needle length: 9 cm Needle insertion depth: 9 cm Catheter type: closed end flexible Catheter size: 20 Guage Catheter at skin depth: 14 cm Test dose: negative  Assessment Events: blood not aspirated, injection not painful, no injection resistance, no paresthesia and negative IV test  Additional Notes Reason for block:procedure for pain

## 2019-08-05 NOTE — Anesthesia Preprocedure Evaluation (Signed)
Anesthesia Evaluation  Patient identified by MRN, date of birth, ID band Patient awake    Reviewed: Allergy & Precautions, NPO status , Patient's Chart, lab work & pertinent test results  History of Anesthesia Complications Negative for: history of anesthetic complications  Airway Mallampati: III  TM Distance: >3 FB Neck ROM: Full    Dental  (+) Dental Advisory Given   Pulmonary neg pulmonary ROS,    breath sounds clear to auscultation       Cardiovascular negative cardio ROS   Rhythm:Regular Rate:Normal     Neuro/Psych negative neurological ROS     GI/Hepatic Neg liver ROS, GERD  Poorly Controlled,  Endo/Other  Morbid obesity  Renal/GU negative Renal ROS     Musculoskeletal   Abdominal (+) + obese,   Peds  Hematology negative hematology ROS (+) plt 187K   Anesthesia Other Findings   Reproductive/Obstetrics (+) Pregnancy                             Anesthesia Physical  Anesthesia Plan  ASA: III  Anesthesia Plan: Epidural   Post-op Pain Management:    Induction:   PONV Risk Score and Plan:   Airway Management Planned: Natural Airway  Additional Equipment:   Intra-op Plan:   Post-operative Plan:   Informed Consent: I have reviewed the patients History and Physical, chart, labs and discussed the procedure including the risks, benefits and alternatives for the proposed anesthesia with the patient or authorized representative who has indicated his/her understanding and acceptance.     Dental advisory given  Plan Discussed with:   Anesthesia Plan Comments: (Patient identified. Risks/Benefits/Options discussed with patient including but not limited to bleeding, infection, nerve damage, paralysis, failed block, incomplete pain control, headache, blood pressure changes, nausea, vomiting, reactions to medication both or allergic, itching and postpartum back pain. Confirmed  with bedside nurse the patient's most recent platelet count. Confirmed with patient that they are not currently taking any anticoagulation, have any bleeding history or any family history of bleeding disorders. Patient expressed understanding and wished to proceed. All questions were answered.  )        Anesthesia Quick Evaluation

## 2019-08-05 NOTE — Progress Notes (Signed)
Labor Progress Note Jody Hansen is a 24 y.o. G3P1011 at [redacted]w[redacted]d presented for labor  S:  Comfortable with ctx. Feeling some pelvic pressure.  O:  BP 116/63   Pulse 85   Temp 98.1 F (36.7 C) (Oral)   Resp 18   Ht 5\' 2"  (1.575 m)   Wt 106.5 kg   LMP 11/03/2018 (Exact Date)   SpO2 97%   BMI 42.96 kg/m  EFM: baseline 125 bpm/ mod variability/ + accels/ variable decels  Toco/IUPC: q2-3 SVE: 9/100/-1 SROM- clear  A/P: 24 y.o. G3P1011 [redacted]w[redacted]d  1. Labor: protracted active 2. FWB: Cat II 3. Pain: epidural  Pt was thought to be fully dilated per RN, actively pushed for less than 10 min w/o descent and had prolonged decel. FHR recovered well. Cervix 9/100/-1, LOP. IUPC placed, will start Pitocin if not adequate and place in exagerated sims. Anticipate labor progress and SVD.  Julianne Handler, CNM 4:23 PM

## 2019-08-05 NOTE — H&P (Addendum)
OBSTETRIC ADMISSION HISTORY AND PHYSICAL  Jody Hansen is a 24 y.o. female G3P1011 with IUP at [redacted]w[redacted]d by 5 wk U/S presenting for SOL. She reports +FMs, No LOF, no VB, no blurry vision, headaches or peripheral edema, and RUQ pain.  She plans on breast and bottle feeding. She request POP for birth control. She received her prenatal care at [facility]   Dating: By [place] --->  Estimated Date of Delivery: 08/17/19  Sono:  @[redacted]w[redacted]d ,  normal anatomy, Cephalic presentation,  [id number] g, 83% EFW   Prenatal History/Complications: Obesity  Past Medical History: Past Medical History:  Diagnosis Date  . Kidney stones 2017  . Pregnant 01/23/2015  . Scoliosis     Past Surgical History: Past Surgical History:  Procedure Laterality Date  . NO PAST SURGERIES      Obstetrical History: OB History    Gravida  3   Para  1   Term  1   Preterm      AB  1   Living  1     SAB  1   TAB      Ectopic      Multiple  0   Live Births  1           Social History: Social History   Socioeconomic History  . Marital status: Significant Other    Spouse name: Not on file  . Number of children: 1  . Years of education: Not on file  . Highest education level: High school graduate  Occupational History  . Occupation: call center  Tobacco Use  . Smoking status: Never Smoker  . Smokeless tobacco: Never Used  Substance and Sexual Activity  . Alcohol use: No  . Drug use: No  . Sexual activity: Not Currently    Birth control/protection: None  Other Topics Concern  . Not on file  Social History Narrative  . Not on file   Social Determinants of Health   Financial Resource Strain: Low Risk   . Difficulty of Paying Living Expenses: Not hard at all  Food Insecurity: No Food Insecurity  . Worried About Charity fundraiser in the Last Year: Never true  . Ran Out of Food in the Last Year: Never true  Transportation Needs: No Transportation Needs  . Lack of Transportation (Medical): No   . Lack of Transportation (Non-Medical): No  Physical Activity: Inactive  . Days of Exercise per Week: 0 days  . Minutes of Exercise per Session: 0 min  Stress: No Stress Concern Present  . Feeling of Stress : Not at all  Social Connections: Somewhat Isolated  . Frequency of Communication with Friends and Family: More than three times a week  . Frequency of Social Gatherings with Friends and Family: Twice a week  . Attends Religious Services: Never  . Active Member of Clubs or Organizations: No  . Attends Archivist Meetings: Never  . Marital Status: Living with partner    Family History: Family History  Problem Relation Age of Onset  . Diabetes Paternal Grandfather   . Diabetes Paternal Grandmother   . Cancer Maternal Grandmother   . Cancer Maternal Grandfather   . Diabetes Father   . Diabetes Mother   . Diabetes Sister        borderline  . Lung disease Paternal Aunt     Allergies: Allergies  Allergen Reactions  . Depo-Provera [Medroxyprogesterone Acetate] Other (See Comments)    Weight gain; bruising; headaches  Medications Prior to Admission  Medication Sig Dispense Refill Last Dose  . Prenatal Vit-Fe Fumarate-FA (PNV PRENATAL PLUS MULTIVITAMIN) 27-1 MG TABS Take 1 tablet by mouth daily. 30 tablet 11 08/04/2019 at 1200  . Blood Pressure Monitor MISC For regular home bp monitoring during pregnancy 1 each 0      Review of Systems   All systems reviewed and negative except as stated in HPI  Blood pressure (!) 108/92, pulse 72, temperature 97.7 F (36.5 C), temperature source Oral, resp. rate 18, height 5\' 2"  (1.575 m), weight 106.5 kg, last menstrual period 11/03/2018, SpO2 99 %. General appearance: alert, cooperative and appears stated age Respiratory: breathing comfortably on room air Presentation: cephalic Fetal monitoringBaseline: 130 bpm, Variability: Good {> 6 bpm) and Accelerations: Reactive Uterine activityFrequency: Every 4   minutes Dilation: 9 Effacement (%): 100 Station: -1, 0 Exam by:: Tresa Moore, RN   Prenatal labs: ABO, Rh: --/--/A POS, A POS Performed at [facility], [address], [place], [place] [address]  [phone]) Antibody: NEG (05/31 0945) Rubella: 5.97 (12/01 1036) RPR: Non Reactive (03/08 0900)  HBsAg: Negative (12/01 1036)  HIV: Non Reactive (03/08 0900)  GBS: Negative/-- (05/18 1430)  2 hr Glucola normal Genetic screening  normal Anatomy US normal  Prenatal Transfer Tool  Maternal Diabetes: No Genetic Screening: Normal Maternal Ultrasounds/Referrals: Normal Fetal Ultrasounds or other Referrals:  None Maternal Substance Abuse:  No Significant Maternal Medications:  None Significant Maternal Lab Results: Group B Strep negative  Results for orders placed or performed during the hospital encounter of 08/05/19 (from the past 24 hour(s))  CBC   Collection Time: 08/05/19  9:45 AM  Result Value Ref Range   WBC 12.5 (H) 4.0 - 10.5 K/uL   RBC 3.88 3.87 - 5.11 MIL/uL   Hemoglobin 11.8 (L) 12.0 - 15.0 g/dL   HCT 35.5 (L) 36.0 - 46.0 %   MCV 91.5 80.0 - 100.0 fL   MCH 30.4 26.0 - 34.0 pg   MCHC 33.2 30.0 - 36.0 g/dL   RDW 13.0 11.5 - 15.5 %   Platelets 218 150 - 400 K/uL   nRBC 0.0 0.0 - 0.2 %  Type and screen [place]   Collection Time: 08/05/19  9:45 AM  Result Value Ref Range   ABO/RH(D) A POS    Antibody Screen NEG    Sample Expiration      08/08/2019,2359 Performed at [facility], [place] [address] [place], [place] [address]   ABO/Rh   Collection Time: 08/05/19  9:45 AM  Result Value Ref Range   ABO/RH(D)      A POS Performed at [facility] [address], [place], [place] [address]   Urinalysis, Routine w reflex microscopic   Collection Time: 08/05/19 12:00 PM  Result Value Ref Range   Color, Urine AMBER (A) YELLOW   APPearance CLOUDY (A) CLEAR   Specific Gravity, Urine 1.024 1.005 - 1.030   pH 5.0 5.0 - 8.0   Glucose,  UA NEGATIVE NEGATIVE mg/dL   Hgb urine dipstick LARGE (A) NEGATIVE   Bilirubin Urine NEGATIVE NEGATIVE   Ketones, ur 5 (A) NEGATIVE mg/dL   Protein, ur 100 (A) NEGATIVE mg/dL   Nitrite NEGATIVE NEGATIVE   Leukocytes,Ua MODERATE (A) NEGATIVE   RBC / HPF >50 (H) 0 - 5 RBC/hpf   WBC, UA >50 (H) 0 - 5 WBC/hpf   Bacteria, UA MANY (A) NONE SEEN   Squamous Epithelial /  LPF 11-20 0 - 5   Mucus PRESENT    Non Squamous Epithelial 0-5 (A) NONE SEEN    Patient Active Problem List   Diagnosis Date Noted  . Normal labor 08/05/2019  . Supervision of normal pregnancy 02/11/2019  . Migraines with aura 06/02/2016  . Ganglion cyst 08/10/2015  . Renal colic on right side [id number]    Assessment/Plan:  SABY SIMA is a 24 y.o. G3P1011 at [redacted]w[redacted]d here for SOL.  #Labor: Active stage. Expectant management.  Will permit time for epidural to set and then AROM. #Pain: Epidural #FWB: Cat I #ID:  GBS neg #MOF: Both #MOC:POP #Circ:  yes  Matilde Haymaker, MD  08/05/2019, 1:18 PM   Midwife attestation: I have seen and examined this patient; I agree with above documentation in the resident's note.   PE: Gen: calm comfortable, NAD Resp: normal effort and rate Abd: gravid  ROS, labs, PMH reviewed  Assessment/Plan: [redacted] weeks gestation Labor: active FWB: Cat I GBS: neg Admit to LD Anticipate SVD  Julianne Handler, CNM  08/05/2019, 3:17 PM

## 2019-08-05 NOTE — Anesthesia Postprocedure Evaluation (Signed)
Anesthesia Post Note  Patient: Jody Hansen  Procedure(s) Performed: AN AD [place]     Patient location during evaluation: Mother Baby Anesthesia Type: Epidural Level of consciousness: awake and alert Pain management: pain level controlled Vital Signs Assessment: post-procedure vital signs reviewed and stable Respiratory status: spontaneous breathing, nonlabored ventilation and respiratory function stable Cardiovascular status: stable Postop Assessment: no headache, no backache and epidural receding Anesthetic complications: no    Last Vitals:  Vitals:   08/05/19 1945 08/05/19 2000  BP: (!) 90/48 112/64  Pulse: 85 72  Resp: 18 18  Temp:    SpO2:      Last Pain:  Vitals:   08/05/19 1730  TempSrc:   PainSc: 0-No pain   Pain Goal:                Epidural/Spinal Function Cutaneous sensation: Tingles (08/05/19 1935), Patient able to flex knees: Yes (08/05/19 1935), Patient able to lift hips off bed: Yes (08/05/19 1935), Back pain beyond tenderness at insertion site: No (08/05/19 1935), Progressively worsening motor and/or sensory loss: No (08/05/19 1935), Bowel and/or bladder incontinence post epidural: No (08/05/19 1935)  ODDONO,ERNEST

## 2019-08-05 NOTE — Discharge Summary (Addendum)
Postpartum Discharge Summary   Patient Name: Jody Hansen DOB: 09-03-1995 MRN: [id number]  Date of admission: 08/05/2019 Delivery date:08/05/2019  Delivering provider: Jonnie Kind  Date of discharge: 08/07/2019  Admitting diagnosis: Normal labor [O80, Z37.9] Intrauterine pregnancy: [redacted]w[redacted]d    Secondary diagnosis:  Active Problems:   Normal labor   Vacuum-assisted vaginal delivery  Additional problems: Obesity    Discharge diagnosis: Term Pregnancy Delivered                                              Post partum procedures:none Augmentation: Pitocin Complications: None  Hospital course: Onset of Labor With Vaginal Delivery      24y.o. yo G3P1011 at 341w2das admitted in Active Labor on 08/05/2019. Patient had an complicated labor course as follows: VAVD with 1 min SD Membrane Rupture Time/Date: 2:40 PM ,08/05/2019   Delivery Method:Vaginal, Vacuum (Extractor)  Episiotomy: None  Lacerations:  None  Patient had an uncomplicated postpartum course.  She is ambulating, tolerating a regular diet, passing flatus, and urinating well. Patient is discharged home in stable condition on 08/07/19.  Newborn Data: Birth date:08/05/2019  Birth time:7:23 PM  Gender:Female  Living status:Living  Apgars:5 ,9  [id number]   Magnesium Sulfate received: No BMZ received: No Rhophylac:N/A MMR:N/A T-DaP:Given prenatally Flu: No Transfusion:No  Physical exam  Vitals:   08/06/19 0306 08/06/19 0740 08/06/19 1510 08/07/19 0530  BP: 130/66 130/72 123/71 125/72  Pulse:  80 92 93  Resp: '18 18 20   '$ Temp: 98.6 F (37 C) 98.2 F (36.8 C) 98.4 F (36.9 C) 98.2 F (36.8 C)  TempSrc: Oral Oral Oral Oral  SpO2: 100%   97%  Weight:      Height:       General: alert, cooperative and no distress Lochia: appropriate Uterine Fundus: firm Incision: N/A DVT Evaluation: No evidence of DVT seen on physical exam. Negative Homan's sign. No significant calf/ankle edema. Labs: Lab Results   Component Value Date   WBC 12.6 (H) 08/06/2019   HGB 10.1 (L) 08/06/2019   HCT 30.6 (L) 08/06/2019   MCV 92.7 08/06/2019   PLT 173 08/06/2019   CMP Latest Ref Rng & Units 08/24/2018  Glucose 70 - 99 mg/dL 100(H)  BUN 6 - 20 mg/dL 8  Creatinine 0.44 - 1.00 mg/dL 0.67  Sodium 135 - 145 mmol/L 137  Potassium 3.5 - 5.1 mmol/L 4.0  Chloride 98 - 111 mmol/L 102  CO2 22 - 32 mmol/L 25  Calcium 8.9 - 10.3 mg/dL 9.2  Total Protein 6.5 - 8.1 g/dL -  Total Bilirubin 0.3 - 1.2 mg/dL -  Alkaline Phos 38 - 126 U/L -  AST 15 - 41 U/L -  ALT 14 - 54 U/L -   Edinburgh Score: Edinburgh Postnatal Depression Scale Screening Tool 08/06/2019  I have been able to laugh and see the funny side of things. 0  I have looked forward with enjoyment to things. 0  I have blamed myself unnecessarily when things went wrong. 1  I have been anxious or worried for no good reason. 0  I have felt scared or panicky for no good reason. 0  Things have been getting on top of me. 0  I have been so unhappy that I have had difficulty sleeping. 0  I have felt sad or miserable. 0  I have been so unhappy that I have been crying. 0  The thought of harming myself has occurred to me. 0  Edinburgh Postnatal Depression Scale Total 1     After visit meds:  Allergies as of 08/07/2019      Reactions   Depo-provera [medroxyprogesterone Acetate] Other (See Comments)   Weight gain; bruising; headaches      Medication List    TAKE these medications   Blood Pressure Monitor Misc For regular home bp monitoring during pregnancy   ibuprofen 600 MG tablet Commonly known as: ADVIL Take 1 tablet (600 mg total) by mouth every 6 (six) hours.   PNV Prenatal Plus Multivitamin 27-1 MG Tabs Take 1 tablet by mouth daily.      Discharge home in stable condition Infant Feeding: Breast Infant Disposition:home with mother Discharge instruction: per After Visit Summary and Postpartum booklet. Activity: Advance as tolerated. Pelvic  rest for 6 weeks.  Diet: routine diet Future Appointments: Future Appointments  Date Time Provider [place]  09/10/2019 11:30 AM Roma Schanz, CNM [facility]   Follow up Visit: Please schedule this patient for a Virtual postpartum visit in 6 weeks with the following provider: Any provider. Additional Postpartum F/U:none  Low risk pregnancy complicated by: none Delivery mode:  Vaginal, Vacuum (Extractor)  Anticipated Birth Control:  POPs   08/07/2019 [name], MD  GME ATTESTATION:  I saw and evaluated the patient. I agree with the findings and the plan of care as documented in the resident's note.  Merilyn Baba, DO OB Fellow, [place] for [place] 08/07/2019 10:57 AM

## 2019-08-06 ENCOUNTER — Encounter (HOSPITAL_COMMUNITY): Payer: Self-pay | Admitting: Obstetrics and Gynecology

## 2019-08-06 LAB — CBC
HCT: 30.6 % — ABNORMAL LOW (ref 36.0–46.0)
Hemoglobin: 10.1 g/dL — ABNORMAL LOW (ref 12.0–15.0)
MCH: 30.6 pg (ref 26.0–34.0)
MCHC: 33 g/dL (ref 30.0–36.0)
MCV: 92.7 fL (ref 80.0–100.0)
Platelets: 173 10*3/uL (ref 150–400)
RBC: 3.3 MIL/uL — ABNORMAL LOW (ref 3.87–5.11)
RDW: 13.2 % (ref 11.5–15.5)
WBC: 12.6 10*3/uL — ABNORMAL HIGH (ref 4.0–10.5)
nRBC: 0 % (ref 0.0–0.2)

## 2019-08-06 LAB — RPR: RPR Ser Ql: NONREACTIVE

## 2019-08-06 NOTE — Progress Notes (Signed)
POSTPARTUM PROGRESS NOTE  Subjective: Jody Hansen is a 24 y.o. [id number] PPD#1 s/p VAVD at [redacted]w[redacted]d.  She reports she doing well. No acute events overnight. She denies any problems with ambulating, voiding or po intake. Denies nausea or vomiting. She has passed flatus. Pain is well controlled.  Lochia is appropriate.  Objective: Blood pressure 130/72, pulse 80, temperature 98.2 F (36.8 C), temperature source Oral, resp. rate 18, height 5\' 2"  (1.575 m), weight 106.5 kg, last menstrual period 11/03/2018, SpO2 100 %, unknown if currently breastfeeding.  Physical Exam:  General: alert, cooperative and no distress Chest: no respiratory distress Abdomen: soft, non-tender  Uterine Fundus: firm, appropriately tender Extremities: No calf swelling or tenderness  No edema  Recent Labs    08/05/19 0945 08/06/19 0501  HGB 11.8* 10.1*  HCT 35.5* 30.6*    Assessment/Plan: Jody Hansen is a 24 y.o. [id number] PPD#1 s/p VAVD at [redacted]w[redacted]d.  Routine Postpartum Care: Doing well, pain well-controlled.  -- Continue routine care, lactation support  -- Contraception: Pills -- Feeding: Breast -- Vaginal delivery complicated by shoulder dystocia: discussed with patient that this will put her a greater risk for shoulder dystocia in future.  Dispo: Plan for discharge tomorrow.  Jody Baba, DO OB/GYN Fellow, [facility] for [facility]

## 2019-08-06 NOTE — Lactation Note (Signed)
This note was copied from a baby's chart. Lactation Consultation Note  Patient Name: Boy Demetrice Pardy [id number] Date: 08/06/2019 Reason for consult: Initial assessment;Early term 37-38.6wks P2.  Mom breastfed her first baby for 2 months and then stopped when she returned to work.  Newborn is 101 hours old.  Mom chooses to breast and formula feed.  She reports breastfeeding is going well and baby latches easily.  Observed baby latch in cradle hold.  Stressed importance of putting baby to breast with feeding cues before giving formula.  Encouraged to call for assist prn.  Breastfeeding consultation services and support information given and reviewed.  Maternal Data Does the patient have breastfeeding experience prior to this delivery?: Yes  Feeding Feeding Type: Breast Fed  LATCH Score Latch: Grasps breast easily, tongue down, lips flanged, rhythmical sucking.  Audible Swallowing: A few with stimulation  Type of Nipple: Everted at rest and after stimulation  Comfort (Breast/Nipple): Soft / non-tender  Hold (Positioning): No assistance needed to correctly position infant at breast.  LATCH Score: 9  Interventions    Lactation Tools Discussed/Used     Consult Status Consult Status: Follow-up Date: 08/07/19 Follow-up type: In-patient    Ave Filter 08/06/2019, 2:22 PM

## 2019-08-07 ENCOUNTER — Encounter: Payer: Medicaid Other | Admitting: Family Medicine

## 2019-08-07 MED ORDER — IBUPROFEN 600 MG PO TABS: 600.0000 mg | ORAL_TABLET | Freq: Four times a day (QID) | ORAL | 0 refills | Status: DC

## 2019-08-07 NOTE — Lactation Note (Signed)
This note was copied from a baby's chart. Lactation Consultation Note  Patient Name: Jody Hansen [id number] Date: 08/07/2019  [name] now 33 hours old early term infant vacuum assist with shoulkder dystocia. . Mom reports she had planned to do both breastfeeding and formula feeding but now is only formula feeding. Mom denies need for lactation services at this time.  Mom reports she is no longer breastfeeding because he wanted to eat all the time.  Inquired about pumping.  Mom reports she does not have a pump/.  Mom being d/c today.  Gave RN manual breastpump to give to mom.  Discussed any breastmilk is good that it isn't an all or nothing thing. Mom has breastfeeding resource for home use. Maternal Data    Feeding    LATCH Score                   Interventions    Lactation Tools Discussed/Used     Consult Status      Bryella Diviney Thompson Caul 08/07/2019, 3:00 PM

## 2019-08-08 NOTE — Progress Notes (Signed)
Chart audit

## 2019-08-28 ENCOUNTER — Ambulatory Visit (INDEPENDENT_AMBULATORY_CARE_PROVIDER_SITE_OTHER): Payer: Medicaid Other | Admitting: Advanced Practice Midwife

## 2019-08-28 ENCOUNTER — Other Ambulatory Visit: Payer: Self-pay | Admitting: Women's Health

## 2019-08-28 ENCOUNTER — Encounter: Payer: Self-pay | Admitting: Advanced Practice Midwife

## 2019-08-28 VITALS — BP 122/79 | HR 79 | Ht 62.0 in | Wt 210.0 lb

## 2019-08-28 DIAGNOSIS — Z3202 Encounter for pregnancy test, result negative: Secondary | ICD-10-CM

## 2019-08-28 DIAGNOSIS — F53 Postpartum depression: Secondary | ICD-10-CM | POA: Insufficient documentation

## 2019-08-28 DIAGNOSIS — M5441 Lumbago with sciatica, right side: Secondary | ICD-10-CM

## 2019-08-28 DIAGNOSIS — O99345 Other mental disorders complicating the puerperium: Secondary | ICD-10-CM | POA: Diagnosis not present

## 2019-08-28 DIAGNOSIS — O99893 Other specified diseases and conditions complicating puerperium: Secondary | ICD-10-CM | POA: Diagnosis not present

## 2019-08-28 LAB — POCT URINALYSIS DIPSTICK
Blood, UA: NEGATIVE
Glucose, UA: NEGATIVE
Ketones, UA: NEGATIVE
Leukocytes, UA: NEGATIVE
Nitrite, UA: NEGATIVE
Protein, UA: NEGATIVE

## 2019-08-28 LAB — POCT URINE PREGNANCY: Preg Test, Ur: NEGATIVE

## 2019-08-28 MED ORDER — LEVONORGEST-ETH ESTRAD 91-DAY 0.15-0.03 &0.01 MG PO TABS: 1.0000 | ORAL_TABLET | Freq: Every day | ORAL | 4 refills | Status: DC

## 2019-08-28 MED ORDER — SERTRALINE HCL 25 MG PO TABS
25.0000 mg | ORAL_TABLET | Freq: Every day | ORAL | 3 refills | Status: DC
Start: 2019-08-28 — End: 2019-09-10

## 2019-08-28 NOTE — Patient Instructions (Signed)

## 2019-08-28 NOTE — Progress Notes (Signed)
GYN VISIT Patient name: Jody Hansen MRN [id number]  Date of birth: December 04, 1995 Chief Complaint:   Back Pain (since getting epidural; having sharp pains in right arm and right leg; wants to start birth control pills; feels angry and wants to stay home; crying alot )  History of Present Illness:   Jody Hansen is a 24 y.o. G9P2012 Caucasian female being seen today for multiple concerns including R mid-low back discomfort most of the time with pain shooting down her right arm once, and pain down her right leg x 3. Denies dysuria. Has tried scheduled Tylenol and Motrin for min relief. Had an epidural in labor. PHQ9 score of 21- denies SI/HI.  Is bottlefeeding and would like to try continuous COC (has taken before with good success). Has not had sex since delivery.  Depression screen [facility] 2/9 02/11/2019 12/14/2018 07/11/2018 06/13/2017 06/02/2016  Decreased Interest 0 3 0 0 0  Down, Depressed, Hopeless 0 3 0 0 0  PHQ - 2 Score 0 6 0 0 0  Altered sleeping 3 3 - - -  Tired, decreased energy 2 3 - - -  Change in appetite 2 1 - - -  Feeling bad or failure about yourself  0 2 - - -  Trouble concentrating 0 1 - - -  Moving slowly or fidgety/restless 0 0 - - -  Suicidal thoughts 0 0 - - -  PHQ-9 Score 7 16 - - -    No LMP recorded. The current method of family planning is abstinence.  Last pap April 2019. Results were:  normal Review of Systems:   Pertinent items are noted in HPI Denies fever/chills, dizziness, headaches, visual disturbances, fatigue, shortness of breath, chest pain, abdominal pain, vomiting, abnormal vaginal discharge/itching/odor/irritation, problems with periods, bowel movements, urination, or intercourse unless otherwise stated above.  Pertinent History Reviewed:  Reviewed past medical,surgical, social, obstetrical and family history.  Reviewed problem list, medications and allergies. Physical Assessment:   Vitals:   08/28/19 0956  BP: 122/79  Pulse: 79  Weight:  210 lb (95.3 kg)  Height: 5\' 2"  (1.575 m)  Body mass index is 38.41 kg/m.       Physical Examination:   General appearance: alert, well appearing, and in no distress  Mental status: alert, oriented to person, place, and time  Skin: warm & dry   Cardiovascular: normal heart rate noted  Respiratory: normal respiratory effort, no distress  Abdomen: soft, non-tender   Pelvic: examination not indicated  Extremities: no edema    Results for orders placed or performed in visit on 08/28/19 (from the past 24 hour(s))  POCT urine pregnancy   Collection Time: 08/28/19 10:03 AM  Result Value Ref Range   Preg Test, Ur Negative Negative  POCT Urinalysis Dipstick   Collection Time: 08/28/19 10:25 AM  Result Value Ref Range   Color, UA     Clarity, UA     Glucose, UA Negative Negative   Bilirubin, UA     Ketones, UA neg    Spec Grav, UA     Blood, UA neg    pH, UA     Protein, UA Negative Negative   Urobilinogen, UA     Nitrite, UA neg    Leukocytes, UA Negative Negative   Appearance     Odor      Assessment & Plan:  1) Right back pain 3wks post-delivery> reviewed center of gravity changes; rec stretches; offered chiropractor eval  2) Postpartum depression> offered  med/counseling combination; elects to start with meds and will let us know at [facility] visit if would like to add counseling  3) Continuous COCs> she is familiar with these; no sex until has finished 4 weeks of pills  Meds:  Meds ordered this encounter  Medications  . sertraline (ZOLOFT) 25 MG tablet    Sig: Take 1 tablet (25 mg total) by mouth daily.    Dispense:  30 tablet    Refill:  3    Order Specific Question:   Supervising Provider    Answer:   Jonnie Kind [[id number]]  . Levonorgestrel-Ethinyl Estradiol (AMETHIA) 0.15-0.03 &0.01 MG tablet    Sig: Take 1 tablet by mouth daily.    Dispense:  91 tablet    Refill:  4    Order Specific Question:   Supervising Provider    Answer:   Jonnie Kind [[id number]]     Orders Placed This Encounter  Procedures  . POCT urine pregnancy  . POCT Urinalysis Dipstick    Return for As scheduled.  Myrtis Ser CNM 08/28/2019 10:55 AM

## 2019-09-10 ENCOUNTER — Other Ambulatory Visit: Payer: Self-pay

## 2019-09-10 ENCOUNTER — Ambulatory Visit (INDEPENDENT_AMBULATORY_CARE_PROVIDER_SITE_OTHER): Payer: Medicaid Other | Admitting: Women's Health

## 2019-09-10 ENCOUNTER — Encounter: Payer: Self-pay | Admitting: Women's Health

## 2019-09-10 DIAGNOSIS — O99345 Other mental disorders complicating the puerperium: Secondary | ICD-10-CM | POA: Diagnosis not present

## 2019-09-10 DIAGNOSIS — Z8759 Personal history of other complications of pregnancy, childbirth and the puerperium: Secondary | ICD-10-CM | POA: Diagnosis not present

## 2019-09-10 DIAGNOSIS — F53 Postpartum depression: Secondary | ICD-10-CM | POA: Diagnosis not present

## 2019-09-10 DIAGNOSIS — R3989 Other symptoms and signs involving the genitourinary system: Secondary | ICD-10-CM

## 2019-09-10 MED ORDER — SERTRALINE HCL 50 MG PO TABS
50.0000 mg | ORAL_TABLET | Freq: Every day | ORAL | 11 refills | Status: DC
Start: 2019-09-10 — End: 2019-10-21

## 2019-09-10 NOTE — Progress Notes (Signed)
POSTPARTUM VISIT Patient name: Jody Hansen MRN [id number]  Date of birth: 15-Jul-1995 Chief Complaint:   Postpartum Care  History of Present Illness:   Jody Hansen is a 24 y.o. G53P2012 Caucasian female being seen today for a postpartum visit. She is 5 weeks postpartum following a vacuum-assisted vaginal birth at 38.2 gestational weeks. 63mn shoulder dystocia resolved by clockwise [place] and [place]. Anesthesia: epidural.  I have fully reviewed the prenatal and intrapartum course. Pregnancy uncomplicated. Postpartum course has been complicated by visit 64/28w/ back pain and depression- started on zoloft 258m Feels like dose needs to be increased. Denies SI/HI/II. Declines counseling right now. Does feel like she had depression even prior to pregnancy. Bleeding no bleeding. Bowel function is normal. Bladder function is feels pressure.  Patient is sexually active. Last sexual activity: 7/4. Already on COCs    No LMP recorded.  Baby's course has been uncomplicated. Baby is feeding by bottle    Edinburgh Postpartum Depression Screening:   Edinburgh Postnatal Depression Scale - 09/10/19 1129      Edinburgh Postnatal Depression Scale:  In the Past 7 Days   I have been able to laugh and see the funny side of things. 1    I have looked forward with enjoyment to things. 3    I have blamed myself unnecessarily when things went wrong. 2    I have been anxious or worried for no good reason. 3    I have felt scared or panicky for no good reason. 2    Things have been getting on top of me. 2    I have been so unhappy that I have had difficulty sleeping. 1    I have felt sad or miserable. 3    I have been so unhappy that I have been crying. 1    The thought of harming myself has occurred to me. 0    Edinburgh Postnatal Depression Scale Total 18          Review of Systems:   Pertinent items are noted in HPI Denies Abnormal vaginal discharge w/ itching/odor/irritation,  headaches, visual changes, shortness of breath, chest pain, abdominal pain, severe nausea/vomiting, or problems with urination or bowel movements. Pertinent History Reviewed:  Reviewed past medical,surgical, obstetrical and family history.  Reviewed problem list, medications and allergies. OB History  Gravida Para Term Preterm AB Living  _0 SAB TAB Ectopic Multiple Live Births  1     0 2    # Outcome Date GA Lbr Len/2nd Weight Sex Delivery Anes PTL Lv  3 Term 08/05/19 [id number] / 00:49 8 lb 2.3 oz (3.694 kg) M Vag-Vacuum EPI  LIV  2 SAB 08/2018          1 Term 09/19/15 [id number] / 00:46 7 lb 9.5 oz (3.445 kg) F Vag-Spont EPI N LIV   Physical Assessment:   Vitals:   09/10/19 1134  BP: 128/85  Pulse: 87  Weight: 209 lb (94.8 kg)  Height: _1  (1.575 m)  Body mass index is 38.23 kg/m.       Physical Examination:   General appearance: alert, well appearing, and in no distress  Mental status: alert, oriented to person, place, and time  Skin: warm & dry   Cardiovascular: normal heart rate noted   Respiratory: normal respiratory effort, no distress   Breasts: deferred, no complaints   Abdomen: soft, non-tender   Pelvic: exam declined by the  patient  Rectal: not examined  Extremities: no edema       No results found for this or any previous visit (from the past 24 hour(s)).  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  Mood and well being:  . Patient with positive depression screening today. . Pre-existing mental health disorders? No  . Patient does not use tobacco. . Substance use disorder? No    2. Infant care and feeding:  . Patient currently breastfeeding? No    . Childcare strategy if returning to work/school: baby's dad . Infant has a pediatrician/family doctor? Yes  . Recommended that all caregivers be immunized for flu, pertussis and other preventable communicable diseases . Pt does have material needs met such as stable housing, utilities, food and diapers. If not, referred to local resources for help obtaining these.  3. Sexuality, contraception and birth spacing . Provided guidance regarding sexuality, management of dyspareunia, and resumption of intercourse . Patient does not want a pregnancy in the near future.  Discussed avoiding interpregnancy interval <72m24m and recommended birth spacing of 18 months  4. Sleep and fatigue . Discussed coping options for fatigue and sleep disruption . Encouraged family/partner/community support of 4 hrs of uninterrupted sleep to help with mood and fatigue  5. Physical recovery  . Pt did not have a cesarean section . Patient had a periurethral laceration-no repair, perineal healing and pain reviewed.  . Patient has urinary incontinence? No, fecal incontinence? No  . Patient is safe to resume physical activity. Discussed attainment of healthy weight.  6.  Chronic disease management . Discussed pregnancy complications if any, and their implications for future childbearing and long-term maternal health. . Review recommendations for prevention of recurrent pregnancy complications, such as 17 hydroxyprogesterone caproate to reduce risk for recurrent PTB not applicable, or aspirin to reduce risk of preeclampsia not applicable. . Pt had GDM: No. . Referred for f/u w/ PCP or subspecialist providers as indicated: not applicable  7. Health maintenance . Last pap smear 06/13/17 and results were normal . Mammogram at 24yo or earlier if indicated  Meds:  Meds ordered this encounter  Medications  . sertraline (ZOLOFT) 50 MG tablet    Sig: Take 1 tablet (50 mg total) by mouth daily.    Dispense:  30 tablet     Refill:  11    Order Specific Question:   Supervising Provider    Answer:   EURFlorian [id number]]    Follow-up: Return in about 4 weeks (around 10/08/2019) for F/U, with me.   Orders Placed This Encounter  Procedures  . Urine Culture    [facility]6/2021 12:27 PM

## 2019-09-10 NOTE — Patient Instructions (Signed)

## 2019-09-12 LAB — URINE CULTURE

## 2019-09-24 ENCOUNTER — Other Ambulatory Visit: Payer: Self-pay

## 2019-09-27 ENCOUNTER — Encounter: Payer: Self-pay | Admitting: Nurse Practitioner

## 2019-10-08 ENCOUNTER — Ambulatory Visit: Payer: Medicaid Other | Admitting: Women's Health

## 2019-10-21 ENCOUNTER — Ambulatory Visit (INDEPENDENT_AMBULATORY_CARE_PROVIDER_SITE_OTHER): Payer: Medicaid Other | Admitting: Women's Health

## 2019-10-21 ENCOUNTER — Encounter: Payer: Self-pay | Admitting: Women's Health

## 2019-10-21 ENCOUNTER — Other Ambulatory Visit: Payer: Self-pay

## 2019-10-21 VITALS — BP 112/75 | HR 85 | Ht 62.0 in | Wt 206.0 lb

## 2019-10-21 DIAGNOSIS — O99345 Other mental disorders complicating the puerperium: Secondary | ICD-10-CM | POA: Diagnosis not present

## 2019-10-21 DIAGNOSIS — F53 Postpartum depression: Secondary | ICD-10-CM

## 2019-10-21 MED ORDER — SERTRALINE HCL 50 MG PO TABS
75.0000 mg | ORAL_TABLET | Freq: Every day | ORAL | 11 refills | Status: DC
Start: 2019-10-21 — End: 2019-11-22

## 2019-10-21 NOTE — Progress Notes (Signed)
° °  GYN VISIT Patient name: Jody Hansen MRN [id number]  Date of birth: 1995-11-17 Chief Complaint:   Follow-up (meds)  History of Present Illness:   Jody Hansen is a 24 y.o. G71P2012 Caucasian female being seen today for f/u on increase of zoloft to 50mg  on 7/6 for PPD.  Does not feel any improvement. Denies SI/HI/II. Discussed increasing to 75mg , pt wants to try this. She is also interested in therapy now.  Depression screen [facility] 2/9 10/21/2019 02/11/2019 12/14/2018 07/11/2018 06/13/2017  Decreased Interest 2 0 3 0 0  Down, Depressed, Hopeless 1 0 3 0 0  PHQ - 2 Score 3 0 6 0 0  Altered sleeping 3 3 3  - -  Tired, decreased energy 2 2 3  - -  Change in appetite 3 2 1  - -  Feeling bad or failure about yourself  2 0 2 - -  Trouble concentrating 0 0 1 - -  Moving slowly or fidgety/restless 1 0 0 - -  Suicidal thoughts 0 0 0 - -  PHQ-9 Score 14 7 16  - -  Difficult doing work/chores Extremely dIfficult - - - -    No LMP recorded. The current method of family planning is OCP (estrogen/progesterone).  Last pap 06/13/17. Results were:  normal Review of Systems:   Pertinent items are noted in HPI Denies fever/chills, dizziness, headaches, visual disturbances, fatigue, shortness of breath, chest pain, abdominal pain, vomiting, abnormal vaginal discharge/itching/odor/irritation, problems with periods, bowel movements, urination, or intercourse unless otherwise stated above.  Pertinent History Reviewed:  Reviewed past medical,surgical, social, obstetrical and family history.  Reviewed problem list, medications and allergies. Physical Assessment:   Vitals:   10/21/19 1142  BP: 112/75  Pulse: 85  Weight: 206 lb (93.4 kg)  Height: 5\' 2"  (1.575 m)  Body mass index is 37.68 kg/m.       Physical Examination:   General appearance: alert, well appearing, and in no distress  Mental status: alert, oriented to person, place, and time  Skin: warm & dry   Cardiovascular: normal heart rate  noted  Respiratory: normal respiratory effort, no distress  Abdomen: soft, non-tender   Pelvic: examination not indicated  Extremities: no edema   Chaperone: n/a    No results found for this or any previous visit (from the past 24 hour(s)).  Assessment & Plan:  1) PPD> no change w/ increase to 50mg  zoloft, will try increasing to 75mg  and [facility] order placed for therapy, f/u 4wks  Meds:  Meds ordered this encounter  Medications   sertraline (ZOLOFT) 50 MG tablet    Sig: Take 1.5 tablets (75 mg total) by mouth daily.    Dispense:  45 tablet    Refill:  11    Order Specific Question:   Supervising Provider    Answer:   Florian Buff [[id number]]    Orders Placed This Encounter  Procedures   Amb ref to [facility]    Return in about 4 weeks (around 11/18/2019) for F/U, in person, CNM.  Hopkins, G [facility] 10/21/2019 12:24 PM

## 2019-11-08 ENCOUNTER — Other Ambulatory Visit: Payer: Self-pay

## 2019-11-08 ENCOUNTER — Telehealth: Payer: Self-pay | Admitting: Family Medicine

## 2019-11-08 ENCOUNTER — Telehealth (INDEPENDENT_AMBULATORY_CARE_PROVIDER_SITE_OTHER): Payer: Medicaid Other | Admitting: Family Medicine

## 2019-11-08 DIAGNOSIS — J029 Acute pharyngitis, unspecified: Secondary | ICD-10-CM

## 2019-11-08 NOTE — Telephone Encounter (Signed)
Ms. Jody Hansen, Jody Hansen are scheduled for a virtual visit with your provider today.    Just as we do with appointments in the office, we must obtain your consent to participate.  Your consent will be active for this visit and any virtual visit you may have with one of our providers in the next 365 days.    If you have a MyChart account, I can also send a copy of this consent to you electronically.  All virtual visits are billed to your insurance company just like a traditional visit in the office.  As this is a virtual visit, video technology does not allow for your provider to perform a traditional examination.  This may limit your provider's ability to fully assess your condition.  If your provider identifies any concerns that need to be evaluated in person or the need to arrange testing such as labs, EKG, etc, we will make arrangements to do so.    Although advances in technology are sophisticated, we cannot ensure that it will always work on either your end or our end.  If the connection with a video visit is poor, we may have to switch to a telephone visit.  With either a video or telephone visit, we are not always able to ensure that we have a secure connection.   I need to obtain your verbal consent now.   Are you willing to proceed with your visit today?   Jody Hansen has provided verbal consent on 11/08/2019 for a virtual visit (video or telephone).   Vicente Males, LPN 3/0/9407  6:80 AM

## 2019-11-08 NOTE — Telephone Encounter (Signed)
Ms. idamae, coccia are scheduled for a virtual visit with your provider today.    Just as we do with appointments in the office, we must obtain your consent to participate.  Your consent will be active for this visit and any virtual visit you may have with one of our providers in the next 365 days.    If you have a MyChart account, I can also send a copy of this consent to you electronically.  All virtual visits are billed to your insurance company just like a traditional visit in the office.  As this is a virtual visit, video technology does not allow for your provider to perform a traditional examination.  This may limit your provider's ability to fully assess your condition.  If your provider identifies any concerns that need to be evaluated in person or the need to arrange testing such as labs, EKG, etc, we will make arrangements to do so.    Although advances in technology are sophisticated, we cannot ensure that it will always work on either your end or our end.  If the connection with a video visit is poor, we may have to switch to a telephone visit.  With either a video or telephone visit, we are not always able to ensure that we have a secure connection.   I need to obtain your verbal consent now.   Are you willing to proceed with your visit today?   JONIYAH MALLINGER has provided verbal consent on 11/08/2019 for a virtual visit (video or telephone).   Vicente Males, LPN 03/12/1094  0:45 AM

## 2019-11-08 NOTE — Progress Notes (Signed)
Patient ID: Jody Hansen, female    DOB: October 06, 1995, 24 y.o.   MRN: [id number]   Chief Complaint  Patient presents with  . Cough   Subjective:    HPI Pt having headache, cough and runny nose. Headache going on for about 2 days. Also having mucus but unable to get any up. Has taken Tylenol once.    Medical History Jody Hansen has a past medical history of Kidney stones (2017), Pregnant (01/23/2015), and Scoliosis.   Outpatient Encounter Medications as of 11/08/2019  Medication Sig  . Blood Pressure Monitor MISC For regular home bp monitoring during pregnancy  . QUASENSE 0.15-0.03 MG tablet TAKE ONE TABLET BY MOUTH DAILY.  Marland Kitchen sertraline (ZOLOFT) 50 MG tablet Take 1.5 tablets (75 mg total) by mouth daily.   No facility-administered encounter medications on file as of 11/08/2019.     Review of Systems  Constitutional: Negative for chills and fever.  HENT: Positive for congestion and sore throat.   Respiratory: Negative for shortness of breath.      Vitals There were no vitals taken for this visit.  Objective:   Physical Exam Constitutional:      Appearance: Normal appearance. She is not ill-appearing or toxic-appearing.  HENT:     Mouth/Throat:     Comments: Patient reports throat is raw from coughing. Will look in her throat to see if she has exudate and will do strep on her if she does. Pulmonary:     Effort: Pulmonary effort is normal.     Comments: No shortness of breath with conversation. Neurological:     Mental Status: She is alert and oriented to person, place, and time.  Psychiatric:        Behavior: Behavior normal.    This is a video visit. No exam done.   Assessment and Plan   1. Sore throat   Jody Hansen presents today via video visit. She is wanting to know if she is contagious. I explained I am unable to know that without testing. She is coming by today for a nurse visit for Covid testing and possibly strep testing if she has exudate on her throat.  Denies fever, changes in smell or taste, no known Covid exposure, she has a Covid test at home that she may send off today or stop  By the office for a nurse visit Covid test.   Agrees with plan of care discussed today. Understands warning signs to seek further care: fever, shortness of breath, or any respiratory symptoms. Understands to follow-up if symptoms do not improve or if anything changes. Quarantine guidelines given.   Your illness is likely caused by a virus. I recommend supportive therapy to help you to feel better.  1) Get lots of rest.  2) Take over the counter pain medication if needed, such as acetaminophen or ibuprofen. Read and follow instructions on the label and make sure not to combine other medications that may have same ingredients in it. It is important to not take too much of these ingredients.  3) Drink plenty of caffeine-free fluids. (If you have heart or kidney problems, follow the instructions of your specialist regarding amounts).  4) If you are hungry, eat a bland diet, such as the BRAT diet (bananas, rice, applesauce, toast).  5) Let us know if you are not feeling better in a week.  She will take Mucinex OTC and increase her water intake to assist with getting mucus up, and use salt water gargles for her  throat.    Chalmers Guest, NP 11/08/2019  Virtual Visit via Video Note  I connected with Jody Hansen on 11/08/19 at  9:50 AM EDT by a video enabled telemedicine application and verified that I am speaking with the correct person using two identifiers.  Location: Patient: home Provider: office   I discussed the limitations of evaluation and management by telemedicine and the availability of in person appointments. The patient expressed understanding and agreed to proceed.  History of Present Illness:    Observations/Objective:   Assessment and Plan:   Follow Up Instructions:    I discussed the assessment and treatment plan with the  patient. The patient was provided an opportunity to ask questions and all were answered. The patient agreed with the plan and demonstrated an understanding of the instructions.   The patient was advised to call back or seek an in-person evaluation if the symptoms worsen or if the condition fails to improve as anticipated.  I provided 15 minutes of non-face-to-face time during this encounter.

## 2019-11-10 ENCOUNTER — Encounter: Payer: Self-pay | Admitting: Family Medicine

## 2019-11-18 ENCOUNTER — Ambulatory Visit: Payer: Medicaid Other | Admitting: Women's Health

## 2019-11-22 ENCOUNTER — Other Ambulatory Visit: Payer: Self-pay

## 2019-11-22 ENCOUNTER — Other Ambulatory Visit: Payer: Self-pay | Admitting: Women's Health

## 2019-11-22 ENCOUNTER — Encounter: Payer: Self-pay | Admitting: Women's Health

## 2019-11-22 ENCOUNTER — Ambulatory Visit (INDEPENDENT_AMBULATORY_CARE_PROVIDER_SITE_OTHER): Payer: Medicaid Other | Admitting: Women's Health

## 2019-11-22 VITALS — BP 137/89 | HR 95 | Ht 62.0 in | Wt 207.8 lb

## 2019-11-22 DIAGNOSIS — F53 Postpartum depression: Secondary | ICD-10-CM | POA: Diagnosis not present

## 2019-11-22 DIAGNOSIS — Z3202 Encounter for pregnancy test, result negative: Secondary | ICD-10-CM | POA: Diagnosis not present

## 2019-11-22 DIAGNOSIS — O99345 Other mental disorders complicating the puerperium: Secondary | ICD-10-CM

## 2019-11-22 LAB — POCT URINE PREGNANCY: Preg Test, Ur: NEGATIVE

## 2019-11-22 MED ORDER — SERTRALINE HCL 100 MG PO TABS
100.0000 mg | ORAL_TABLET | Freq: Every day | ORAL | 6 refills | Status: DC
Start: 2019-11-22 — End: 2019-12-20

## 2019-11-22 NOTE — Progress Notes (Signed)
° °  GYN VISIT Patient name: Jody Hansen MRN [id number]  Date of birth: 1996/01/06 Chief Complaint:   Follow-up (on Zoloft; may need to be increased)  History of Present Illness:   Jody Hansen is a 24 y.o. G69P2012 Caucasian female being seen today for f/u on increase of zoloft to 75mg  53mth ago for PPD. Doesn't feel much improvement. Has virtual appt on Tues w/ therapist, will be 1st visit. Discussed switching meds vs increasing, pt wants to try increasing again. Denies SI/HI/II. Doesn't want to get out of house, not sleeping well, some anger.  Depression screen [facility] 2/9 10/21/2019 02/11/2019 12/14/2018 07/11/2018 06/13/2017  Decreased Interest 2 0 3 0 0  Down, Depressed, Hopeless 1 0 3 0 0  PHQ - 2 Score 3 0 6 0 0  Altered sleeping 3 3 3  - -  Tired, decreased energy 2 2 3  - -  Change in appetite 3 2 1  - -  Feeling bad or failure about yourself  2 0 2 - -  Trouble concentrating 0 0 1 - -  Moving slowly or fidgety/restless 1 0 0 - -  Suicidal thoughts 0 0 0 - -  PHQ-9 Score 14 7 16  - -  Difficult doing work/chores Extremely dIfficult - - - -    No LMP recorded. (Menstrual status: Oral contraceptives). The current method of family planning is OCP (estrogen/progesterone).  Last pap 06/13/17. Results were:  normal Review of Systems:   Pertinent items are noted in HPI Denies fever/chills, dizziness, headaches, visual disturbances, fatigue, shortness of breath, chest pain, abdominal pain, vomiting, abnormal vaginal discharge/itching/odor/irritation, problems with periods, bowel movements, urination, or intercourse unless otherwise stated above.  Pertinent History Reviewed:  Reviewed past medical,surgical, social, obstetrical and family history.  Reviewed problem list, medications and allergies. Physical Assessment:   Vitals:   11/22/19 1007  BP: 137/89  Pulse: 95  Weight: 207 lb 12.8 oz (94.3 kg)  Height: 5\' 2"  (1.575 m)  Body mass index is 38.01 kg/m.       Physical Examination:     General appearance: alert, well appearing, and in no distress  Mental status: alert, oriented to person, place, and time  Skin: warm & dry   Cardiovascular: normal heart rate noted  Respiratory: normal respiratory effort, no distress  Abdomen: soft, non-tender   Pelvic: examination not indicated  Extremities: no edema   Chaperone: n/a    No results found for this or any previous visit (from the past 24 hour(s)).  Assessment & Plan:  1) PPD> no improvement on increase of zoloft. Discussed switching meds vs increasing, pt wants to try increasing again. Has 1st appt w/ therapist on Tues. To try getting out of house, walking around neighborhood, change of scenery and exercise can be beneficial. F/U 4wks, let me know if decides to change meds before next visit.    Meds:  Meds ordered this encounter  Medications   sertraline (ZOLOFT) 100 MG tablet    Sig: Take 1 tablet (100 mg total) by mouth daily.    Dispense:  30 tablet    Refill:  6    Order Specific Question:   Supervising Provider    Answer:   Elonda Husky, LUTHER H [[id number]]    No orders of the defined types were placed in this encounter.   Return in about 4 weeks (around 12/20/2019) for F/U, with me.  [place], [facility] 11/22/2019 10:41 AM

## 2019-12-04 ENCOUNTER — Encounter: Payer: Self-pay | Admitting: Family Medicine

## 2019-12-05 NOTE — Telephone Encounter (Signed)
Discussed with pt. Pt verbalized understanding. Ifbot kit at front window for pt to pick up.

## 2019-12-05 NOTE — Telephone Encounter (Signed)
It is hard to know Sometimes this can be related to what the person is eating.  Sometimes this can be due to other reasons.  Stool test for blood would be reasonable.  The patient can come by to pick up the kit do this test at home then bring it back for our review.  Also setting up a office visit with Jody Hansen our nurse practitioner for next week would be a good idea.

## 2019-12-09 NOTE — BH Specialist Note (Deleted)
Pt did not arrive to video visit and did not answer the phone ; Left HIPPA-compliant message to call back Guadalupe Kerekes from [facility] at [phone] (main office) or [phone] (Estelene Carmack's office).  ; left MyChart message for patient.      

## 2019-12-20 ENCOUNTER — Telehealth (INDEPENDENT_AMBULATORY_CARE_PROVIDER_SITE_OTHER): Payer: Medicaid Other | Admitting: Women's Health

## 2019-12-20 ENCOUNTER — Encounter: Payer: Self-pay | Admitting: Women's Health

## 2019-12-20 DIAGNOSIS — F418 Other specified anxiety disorders: Secondary | ICD-10-CM | POA: Diagnosis not present

## 2019-12-20 DIAGNOSIS — F53 Postpartum depression: Secondary | ICD-10-CM | POA: Diagnosis not present

## 2019-12-20 DIAGNOSIS — O99345 Other mental disorders complicating the puerperium: Secondary | ICD-10-CM | POA: Diagnosis not present

## 2019-12-20 MED ORDER — ESCITALOPRAM OXALATE 10 MG PO TABS: 10.0000 mg | ORAL_TABLET | Freq: Every day | ORAL | 6 refills | Status: DC

## 2019-12-20 NOTE — Progress Notes (Signed)
[place] VISIT ENCOUNTER NOTE Patient name: Jody Hansen MRN [id number]  Date of birth: 1995/05/12  I connected with patient on 12/20/19 at  9:10 AM EDT by mychart and verified that I am speaking with the correct person using two identifiers.  Pt is not currently in the office, she is at work.  Provider is in the office.    I discussed the limitations, risks, security and privacy concerns of performing an evaluation and management service by telephone and the availability of in person appointments. I also discussed with the patient that there may be a patient responsible charge related to this service. The patient expressed understanding and agreed to proceed.   Chief Complaint:   Follow-up (postprtum depression/ zoloft)  History of Present Illness:   Jody Hansen is a 24 y.o. G108P2012 Caucasian female being evaluated today for f/u on PPD. Zoloft was increased to 100mg  at last visit, states she quit taking it b/c it was causing excessive sweating and wasn't helping. EPDS today 20, was 18 on 9/17. She is about 80mths postpartum. Denies SI/HI/II. 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 Missed appt w/ Roselyn Reef d/t training she was having to do for the job, but would like to reschedule therapy appt. Does want to try another medicine.     Depression screen [facility] 2/9 10/21/2019 02/11/2019 12/14/2018 07/11/2018 06/13/2017  Decreased Interest 2 0 3 0 0  Down, Depressed, Hopeless 1 0 3 0 0  PHQ - 2 Score 3 0 6 0 0  Altered sleeping 3 3 3  - -  Tired, decreased energy 2 2 3  - -  Change in appetite 3 2 1  - -  Feeling bad or failure about yourself  2 0 2 - -  Trouble concentrating 0 0 1 - -  Moving slowly or fidgety/restless 1 0 0 - -  Suicidal thoughts 0 0 0 - -  PHQ-9 Score 14 7 16  - -  Difficult doing work/chores Extremely dIfficult - - - -    No LMP recorded. (Menstrual status: Oral contraceptives). The current method of family planning is OCP  (estrogen/progesterone).  Last pap 06/13/17. Results were:  normal Review of Systems:   Pertinent items are noted in HPI Denies fever/chills, dizziness, headaches, visual disturbances, fatigue, shortness of breath, chest pain, abdominal pain, vomiting, abnormal vaginal discharge/itching/odor/irritation, problems with periods, bowel movements, urination, or intercourse unless otherwise stated above.  Pertinent History Reviewed:  Reviewed past medical,surgical, social, obstetrical and family history.  Reviewed problem list, medications and allergies. Physical Assessment:  There were no vitals filed for this visit.There is no height or weight on file to calculate BMI.       Physical Examination:   General:  Alert, oriented and cooperative.   Mental Status: Normal mood and affect perceived. Normal judgment and thought content.  Physical exam deferred due to nature of the encounter  No results found for this or any previous visit (from the past 24 hour(s)).  Assessment & Plan:  1) PPD/anxiety> rx lexapro 10mg , f/u 4wks, note routed to [facility] to reschedule therapy appt   Meds:  Meds ordered this encounter  Medications  . escitalopram (LEXAPRO) 10 MG tablet    Sig: Take 1 tablet (10 mg total) by mouth daily.    Dispense:  30 tablet    Refill:  6    Order Specific Question:   Supervising Provider    Answer:   Tania Ade H [[id number]]  No orders of the defined types were placed in this encounter.   I discussed the assessment and treatment plan with the patient. The patient was provided an opportunity to ask questions and all were answered. The patient agreed with the plan and demonstrated an understanding of the instructions.   The patient was advised to call back or seek an in-person evaluation/go to the ED if the symptoms worsen or if the condition fails to improve as anticipated.  I provided 15 minutes of non-face-to-face time during this encounter.   Return in about 4 weeks (around  01/17/2020) for F/U, with me, MyChart Video.  [place], [facility] 12/20/2019 10:01 AM

## 2019-12-24 NOTE — BH Specialist Note (Addendum)
Integrated Behavioral Health via Telemedicine Phone [facility]) Visit  12/24/2019 SARON VANORMAN [id number]  Number of [place] visits: 1 Session Start time: 3:15  Session End time: 4:13 Total time: 58 minutes  Referring Provider: Wells Guiles, CNM Type of Service: Individual Patient/Family location: Home [facility] Provider location: Center for [place] [facility]  All persons participating in visit: Patient Jody Hansen and [place]     I connected with SHIARA MCGOUGH a video enabled telemedicine application ([place]) and verified that I am speaking with the correct person using two identifiers.   Discussed confidentiality: Yes   Confirmed demographics & insurance:  Yes   I discussed that engaging in this virtual visit, they consent to the provision of behavioral healthcare and the services will be billed under their insurance.   Patient and/or legal guardian expressed understanding and consented to virtual visit: Yes   PRESENTING CONCERNS: Patient and/or family reports the following symptoms/concerns: Pt states her primary concern today is ongoing depression and anxiety, with an increase in irritability and panic postpartum; pt is open to learning self-coping strategy today, along with referral to psychiatry.  Duration of problem: Ongoing, with increase postpartum; Severity of problem: severe  STRENGTHS (Protective Factors/Coping Skills): Self-awareness and motivated for change  ASSESSMENT: Patient currently experiencing Mood disorder.    GOALS ADDRESSED: Patient will: 1.  Reduce symptoms of: anxiety, depression and insomnia  2.  Increase knowledge and/or ability of: self-management skills  3.  Demonstrate ability to: Increase healthy adjustment to current life circumstances and Increase motivation to adhere to plan of care   Progress of Goals: Ongoing  INTERVENTIONS: Interventions  utilized:  Mindfulness or Psychologist, educational, Medication Monitoring and Psychoeducation and/or Health Education Standardized Assessments completed & reviewed: GAD-7 and PHQ 9 and MDQ  OUTCOME: Patient Response: Pt agrees to treatment plan   PLAN: 1. Follow up with behavioral health clinician on : Two weeks 2. Behavioral recommendations:  -Continue taking Lexapro as prescribed -Accept referral to psychiatry -CALM relaxation breathing exercise twice daily (morning; at bedtime) -Read educational materials regarding coping with symptoms of depression, anxiety with panic -Consider apps (on AVS) for additional self-coping  3. Referral(s): Integrated Orthoptist (In Clinic) and [employer] Resources:  New mom support  I discussed the assessment and treatment plan with the patient and/or parent/guardian. They were provided an opportunity to ask questions and all were answered. They agreed with the plan and demonstrated an understanding of the instructions.   They were advised to call back or seek an in-person evaluation as appropriate.  I discussed that the purpose of this visit is to provide behavioral health care while limiting exposure to the novel coronavirus.  Discussed there is a possibility of technology failure and discussed alternative modes of communication if that failure occurs.  Caroleen Hamman Keena Heesch  Depression screen [facility] 2/9 01/02/2020 10/21/2019 02/11/2019 12/14/2018 07/11/2018  Decreased Interest 3 2 0 3 0  Down, Depressed, Hopeless 3 1 0 3 0  PHQ - 2 Score 6 3 0 6 0  Altered sleeping 3 3 3 3  -  Tired, decreased energy 3 2 2 3  -  Change in appetite 3 3 2 1  -  Feeling bad or failure about yourself  3 2 0 2 -  Trouble concentrating 1 0 0 1 -  Moving slowly or fidgety/restless 3 1 0 0 -  Suicidal thoughts 0 0 0 0 -  PHQ-9 Score 22 14 7 16  -  Difficult  doing work/chores - Extremely dIfficult - - -   GAD 7 : Generalized Anxiety Score 01/02/2020 12/14/2018  Nervous,  Anxious, on Edge 3 0  Control/stop worrying 3 1  Worry too much - different things 3 2  Trouble relaxing 3 1  Restless 1 0  Easily annoyed or irritable 3 3  Afraid - awful might happen 3 0  Total GAD 7 Score 19 7

## 2020-01-02 ENCOUNTER — Ambulatory Visit (INDEPENDENT_AMBULATORY_CARE_PROVIDER_SITE_OTHER): Payer: Medicaid Other | Admitting: Clinical

## 2020-01-02 DIAGNOSIS — F39 Unspecified mood [affective] disorder: Secondary | ICD-10-CM

## 2020-01-02 DIAGNOSIS — F32A Depression, unspecified: Secondary | ICD-10-CM | POA: Diagnosis not present

## 2020-01-02 DIAGNOSIS — F419 Anxiety disorder, unspecified: Secondary | ICD-10-CM

## 2020-01-02 NOTE — Patient Instructions (Signed)
Center for [facility] [place], [place] [address] [phone] (main office) [phone] ([place] office)  New mom online support groups: [email or web address].net   Coping with Panic Attacks   What is a panic attack?  You may have had a panic attack if you experienced four or more of the symptoms listed below coming on abruptly and peaking in about 10 minutes.  Panic Symptoms   . Pounding heart  . Sweating  . Trembling or shaking  . Shortness of breath  . Feeling of choking  . Chest pain  . Nausea or abdominal distress    . Feeling dizzy, unsteady, lightheaded, or faint  . Feelings of unreality or being detached from yourself  . Fear of losing control or going crazy  . Fear of dying  . Numbness or tingling  . Chills or hot flashes      Panic attacks are sometimes accompanied by avoidance of certain places or situations. These are often situations that would be difficult to escape from or in which help might not be available. Examples might include crowded shopping malls, public transportation, restaurants, or driving.   Why do panic attacks occur?   Panic attacks are the body's alarm system gone awry. All of [place] have a built-in alarm system, powered by adrenaline, which increases our heart rate, breathing, and blood flow in response to danger. Ordinarily, this 'danger response system' works well. In some people, however, the response is either out of proportion to whatever stress is going on, or may come out of the blue without any stress at all.   For example, if you are walking in the woods and see a bear coming your way, a variety of changes occur in your body to prepare you to either fight the danger or flee from the situation. Your heart rate will increase to get more blood flow around your body, your breathing rate will quicken so that more oxygen is available, and your muscles will tighten in order to be ready to fight or run.  You may feel nauseated as blood flow leaves your stomach area and moves into your limbs. These bodily changes are all essential to helping you survive the dangerous situation. After the danger has passed, your body functions will begin to go back to normal. This is because your body also has a system for "recovering" by bringing your body back down to a normal state when the danger is over.   As you can see, the emergency response system is adaptive when there is, in fact, a "true" or "real" danger (e.g., bear). However, sometimes people find that their emergency response system is triggered in "everyday" situations where there really is no true physical danger (e.g., in a meeting, in the grocery store, while driving in normal traffic, etc.).   What triggers a panic attack?  Sometimes particularly stressful situations can trigger a panic attack. For example, an argument with your spouse or stressors at work can cause a stress response (activating the emergency response system) because you perceive it as threatening or overwhelming, even if there is no direct risk to your survival.  Sometimes panic attacks don't seem to be triggered by anything in particular- they may "come out of the blue". Somehow, the natural "fight or flight" emergency response system has gotten activated when there is no real danger. Why does the body go into "emergency mode" when there is no real danger?   Often, people with panic attacks are  frightened or alarmed by the physical sensations of the emergency response system. First, unexpected physical sensations are experienced (tightness in your chest or some shortness of breath). This then leads to feeling fearful or alarmed by these symptoms ("Something's wrong!", "Am I having a heart attack?", "Am I going to faint?") The mind perceives that there is a danger even though no real danger exists. This, in turn, activates the emergency response system ("fight or flight"), leading to a  "full blown" panic attack. In summary, panic attacks occur when we misinterpret physical symptoms as signs of impending death, craziness, loss of control, embarrassment, or fear of fear. Sometimes you may be aware of thoughts of danger that activate the emergency response system (for example, thinking "I'm having a heart attack" when you feel chest pressure or increased heart rate). At other times, however, you may not be aware of such thoughts. After several incidences of being afraid of physical sensations, anxiety and panic can occur in response to the initial sensations without conscious thoughts of danger. Instead, you just feel afraid or alarmed. In other words, the panic or fear may seem to occur "automatically" without you consciously telling yourself anything.   After having had one or more panic attacks, you may also become more focused on what is going on inside your body. You may scan your body and be more vigilant about noticing any symptoms that might signal the start of a panic attack. This makes it easier for panic attacks to happen again because you pick up on sensations you might otherwise not have noticed, and misinterpret them as something dangerous. A panic attack may then result.      How do I cope with panic attacks?  An important part of overcoming panic attacks involves re-interpreting your body's physical reactions and teaching yourself ways to decrease the physical arousal. This can be done through practicing the cognitive and behavioral interventions below.   Research has found that over half of people who have panic attacks show some signs of hyperventilation or overbreathing. This can produce initial sensations that alarm you and lead to a panic attack. Overbreathing can also develop as part of the panic attack and make the symptoms worse. When people hyperventilate, certain blood vessels in the body become narrower. In particular, the brain may get slightly less oxygen. This  can lead to the symptoms of dizziness, confusion, and lightheadedness that often occur during panic attacks. Other parts of the body may also get a bit less oxygen, which may lead to numbness or tingling in the hands or feet or the sensation of cold, clammy hands. It also may lead the heart to pump harder. Although these symptoms may be frightening and feel unpleasant, it is important to remember that hyperventilating is not dangerous. However, you can help overcome the unpleasantness of overbreathing by practicing Breathing Retraining.   Practice this basic technique three times a day, every day:  . Inhale. With your shoulders relaxed, inhale as slowly and deeply as you can while you count to six. If you can, use your diaphragm to fill your lungs with air.  . Hold. Keep the air in your lungs as you slowly count to four.  . Exhale. Slowly breath out as you count to six.  . Repeat. Do the inhale-hold-exhale cycle several times. Each time you do it, exhale for longer counts.  Like any new skill, Breathing Retraining requires practice. Try practicing this skill twice a day for several minutes. Initially, do not try  this technique in specific situations or when you become frightened or have a panic attack. Begin by practicing in a quiet environment to build up your skill level so that you can later use it in time of "emergency."   2. Decreasing Avoidance  Regardless of whether you can identify why you began having panic attacks or whether they seemed to come out of the blue, the places where you began having panic attacks often can become triggers themselves. It is not uncommon for individuals to begin to avoid the places where they have had panic attacks. Over time, the individual may begin to avoid more and more places, thereby decreasing their activities and often negatively impacting their quality of life. To break the cycle of avoidance, it is important to first identify the places or situations that are  being avoided, and then to do some "relearning."  To begin this intervention, first create a list of locations or situations that you tend to avoid. Then choose an avoided location or situation that you would like to target first. Now develop an "exposure hierarchy" for this situation or location. An "exposure hierarchy" is a list of actions that make you feel anxious in this situation. Order these actions from least to most anxiety-producing. It is often helpful to have the first item on your hierarchy involve thinking or imagining part of the feared/avoided situation.   Here is an example of an exposure hierarchy for decreasing avoidance of the grocery store. Note how it is ordered from the least amount of anxiety (at the top) to the most anxiety (at the bottom):  Marland Kitchen Think about going to the grocery store alone.  . Go to the grocery store with a friend or family member.  . Go to the grocery store alone to pick up a few small items (5-10 minutes in the store).  . Shopping for 10-20 minutes in the store alone.  . Doing the shopping for the week by myself (20-30 minutes in the store).   Your homework is to "expose" yourself to the lowest item on your hierarchy and use your breathing relaxation and coping statements (see below) to help you remain in the situation. Practice this several times during the upcoming week. Once you have mastered each item with minimal anxiety, move on to the next higher action on your list.   Cognitive Interventions  1. Identify your negative self-talk Anxious thoughts can increase anxiety symptoms and panic. The first step in changing anxious thinking is to identify your own negative, alarming self-talk. Some common alarming thoughts:  . I'm having a heart attack.            . I must be going crazy. . I think I'm dying. Marland Kitchen People will think I'm crazy. . I'm going to pass our.  . Oh no- here it comes.  . I can't stand this.  Rene Paci got to get out of here!  2. Use  positive coping statements Changing or disrupting a pattern of anxious thoughts by replacing them with more calming or supportive statements can help to divert a panic attack. Some common helpful coping statements:  . This is not an emergency.  . I don't like feeling this way, but I can accept it.  . I can feel like this and still be okay.  . This has happened before, and I was okay. I'll be okay this time, too.  . I can be anxious and still deal with this situation.   /Emotional Wellbeing  Apps and Websites Here are a few free apps meant to help you to help yourself.  To find, try searching on the internet to see if the app is offered on Apple/Android devices. If your first choice doesn't come up on your device, the good news is that there are many choices! Play around with different apps to see which ones are helpful to you.    Calm This is an app meant to help increase calm feelings. Includes info, strategies, and tools for tracking your feelings.      Calm Harm  This app is meant to help with self-harm. Provides many 5-minute or 15-min coping strategies for doing instead of hurting yourself.       [place] is a problem-solving tool to help deal with emotions and cope with stress you encounter wherever you are.      MindShift This app can help people cope with anxiety. Rather than trying to avoid anxiety, you can make an important shift and face it.      MY3  MY3 features a support system, safety plan and resources with the goal of offering a tool to use in a time of need.       My Life My Voice  This mood journal offers a simple solution for tracking your thoughts, feelings and moods. Animated emoticons can help identify your mood.       Relax Melodies Designed to help with sleep, on this app you can mix sounds and meditations for relaxation.      Smiling Mind Smiling Mind is meditation made easy: it's a simple tool that helps put a smile on your  mind.        Stop, Breathe & Think  A friendly, simple guide for people through meditations for mindfulness and compassion.  Stop, Breathe and Think Kids Enter your current feelings and choose a "mission" to help you cope. Offers videos for certain moods instead of just sound recordings.       Team Orange The goal of this tool is to help teens change how they think, act, and react. This app helps you focus on your own good feelings and experiences.      The [place] Box The [place] Box (VHB) contains simple tools to help patients with coping, relaxation, distraction, and positive thinking.

## 2020-01-02 NOTE — Addendum Note (Signed)
Addended by: Vesta Mixer C on: 01/02/2020 05:37 PM   Modules accepted: Orders

## 2020-01-15 ENCOUNTER — Encounter: Payer: Self-pay | Admitting: Women's Health

## 2020-01-15 ENCOUNTER — Telehealth (INDEPENDENT_AMBULATORY_CARE_PROVIDER_SITE_OTHER): Payer: Medicaid Other | Admitting: Women's Health

## 2020-01-15 VITALS — Ht 62.0 in | Wt 202.0 lb

## 2020-01-15 DIAGNOSIS — F418 Other specified anxiety disorders: Secondary | ICD-10-CM

## 2020-01-15 DIAGNOSIS — F419 Anxiety disorder, unspecified: Secondary | ICD-10-CM | POA: Diagnosis not present

## 2020-01-15 DIAGNOSIS — O99345 Other mental disorders complicating the puerperium: Secondary | ICD-10-CM

## 2020-01-15 DIAGNOSIS — F53 Postpartum depression: Secondary | ICD-10-CM | POA: Diagnosis not present

## 2020-01-15 MED ORDER — ESCITALOPRAM OXALATE 20 MG PO TABS: 20.0000 mg | ORAL_TABLET | Freq: Every day | ORAL | 6 refills | Status: DC

## 2020-01-15 NOTE — Progress Notes (Signed)
[facility] VIRTUAL [facility] VISIT ENCOUNTER NOTE Patient name: Jody Hansen MRN [id number]  Date of birth: 05-14-1995  I connected with patient on 01/15/20 at  9:50 AM EST by MyChart video and verified that I am speaking with the correct person using two identifiers.  Pt is not currently in the office, she is at work.  Provider is in the office.    I discussed the limitations, risks, security and privacy concerns of performing an evaluation and management service by telephone and the availability of in person appointments. I also discussed with the patient that there may be a patient responsible charge related to this service. The patient expressed understanding and agreed to proceed.   Chief Complaint:   Follow-up (on postpartum depression)  History of Present Illness:   Jody Hansen is a 24 y.o. G14P2012 female 39mths s/p SVB, being evaluated today for PPD/anxiety.  Zoloft was switched to Lexapro 10mg  last visit. States she is doing much better. Feels like maybe increasing it to 20mg  may help though.  Had appt w/ Roselyn Reef on 10/28, states she was referred to psychiatry, got call from them but was unable to answer and hasn't called them back yet. EPDS today 5, was 20 on 12/20/19.  Depression screen [facility] 2/9 01/02/2020 10/21/2019 02/11/2019 12/14/2018 07/11/2018  Decreased Interest 3 2 0 3 0  Down, Depressed, Hopeless 3 1 0 3 0  PHQ - 2 Score 6 3 0 6 0  Altered sleeping 3 3 3 3  -  Tired, decreased energy 3 2 2 3  -  Change in appetite 3 3 2 1  -  Feeling bad or failure about yourself  3 2 0 2 -  Trouble concentrating 1 0 0 1 -  Moving slowly or fidgety/restless 3 1 0 0 -  Suicidal thoughts 0 0 0 0 -  PHQ-9 Score 22 14 7 16  -  Difficult doing work/chores - Extremely dIfficult - - -    No LMP recorded. (Menstrual status: Oral contraceptives). The current method of family planning is OCP (estrogen/progesterone).  Last pap 06/13/17. Results were:  normal Review of Systems:   Pertinent items are  noted in HPI Denies fever/chills, dizziness, headaches, visual disturbances, fatigue, shortness of breath, chest pain, abdominal pain, vomiting, abnormal vaginal discharge/itching/odor/irritation, problems with periods, bowel movements, urination, or intercourse unless otherwise stated above.  Pertinent History Reviewed:  Reviewed past medical,surgical, social, obstetrical and family history.  Reviewed problem list, medications and allergies. Physical Assessment:   Vitals:   01/15/20 1011  Weight: 202 lb (91.6 kg)  Height: 5\' 2"  (1.575 m)  Body mass index is 36.95 kg/m.       Physical Examination:   General:  Alert, oriented and cooperative.   Mental Status: Normal mood and affect perceived. Normal judgment and thought content.  Physical exam deferred due to nature of the encounter  No results found for this or any previous visit (from the past 24 hour(s)).  Assessment & Plan:  1) PPD/anxiety> doing much better on Lexapro 10mg , feels like increase to 20mg  may be beneficial. New rx sent. Call psychiatry back for appt.    Meds:  Meds ordered this encounter  Medications  . escitalopram (LEXAPRO) 20 MG tablet    Sig: Take 1 tablet (20 mg total) by mouth daily.    Dispense:  30 tablet    Refill:  6    Order Specific Question:   Supervising Provider    Answer:   Tania Ade H [[id number]]  No orders of the defined types were placed in this encounter.   I discussed the assessment and treatment plan with the patient. The patient was provided an opportunity to ask questions and all were answered. The patient agreed with the plan and demonstrated an understanding of the instructions.   The patient was advised to call back or seek an in-person evaluation/go to the ED if the symptoms worsen or if the condition fails to improve as anticipated.  I provided 15 minutes of non-face-to-face time during this encounter.   Return in about 1 year (around 01/14/2021) for Physical.  [place], [facility] 01/15/2020 10:57 AM

## 2020-01-28 ENCOUNTER — Telehealth: Payer: Self-pay | Admitting: Family Medicine

## 2020-01-28 DIAGNOSIS — G479 Sleep disorder, unspecified: Secondary | ICD-10-CM

## 2020-01-28 NOTE — Addendum Note (Signed)
Addended by: Dairl Ponder on: 01/28/2020 10:27 AM   Modules accepted: Orders

## 2020-01-28 NOTE — Telephone Encounter (Signed)
Patient notified and is ok proceeding with referral. Referral ordered in [place].

## 2020-01-28 NOTE — Telephone Encounter (Signed)
Nurses This patient is under the care of behavioral health.  They state that she is having some sleep issues.  They recommended trying to get a sleep study. Please communicate with patient if she is interested in moving forward with this please see the following  Please see their notation noted below.  Please go ahead with referral to sleep medicine doctor and let them figure out what would be the best approach ( Dr Rexene Alberts)  Hassel Neth, Caroleen Hamman, LCSW  Bayler Nehring, Elayne Snare, MD; Roma Schanz, CNM Hello Dr. Wolfgang Phoenix, I'm the [facility] [place] [facility]. This pt says she was referred to do a sleep study that was cancelled because of COVID and is requesting another referral. She says her boyfriend noticed she "hyperventilates in sleep" and stops breathing. I'm unable to put in this kind of referral, but sounds like this would be beneficial to her. (This may be contributing to her mood issue if she is not sleeping well). Wells Guiles, CNM, will be seeing Ms. Lindquist on 01/15/20.

## 2020-03-16 ENCOUNTER — Encounter: Payer: Self-pay | Admitting: Neurology

## 2020-03-16 ENCOUNTER — Institutional Professional Consult (permissible substitution): Payer: Medicaid Other | Admitting: Neurology

## 2020-03-16 ENCOUNTER — Telehealth: Payer: Self-pay

## 2020-03-16 NOTE — Telephone Encounter (Signed)
Pt did not show for their appt with Dr. Athar today.  

## 2020-03-25 ENCOUNTER — Encounter: Payer: Self-pay | Admitting: Family Medicine

## 2020-04-02 ENCOUNTER — Encounter: Payer: Self-pay | Admitting: Family Medicine

## 2020-04-02 ENCOUNTER — Ambulatory Visit (INDEPENDENT_AMBULATORY_CARE_PROVIDER_SITE_OTHER): Payer: Medicaid Other | Admitting: Family Medicine

## 2020-04-02 ENCOUNTER — Emergency Department (HOSPITAL_COMMUNITY)
Admission: EM | Admit: 2020-04-02 | Discharge: 2020-04-02 | Disposition: A | Discharge status: Home / Self Care | Payer: Medicaid Other | Attending: Emergency Medicine | Admitting: Emergency Medicine

## 2020-04-02 ENCOUNTER — Emergency Department (HOSPITAL_COMMUNITY): Payer: Medicaid Other

## 2020-04-02 ENCOUNTER — Encounter (HOSPITAL_COMMUNITY): Payer: Self-pay | Admitting: *Deleted

## 2020-04-02 ENCOUNTER — Other Ambulatory Visit: Payer: Self-pay

## 2020-04-02 VITALS — HR 91 | Temp 97.6°F | Resp 16

## 2020-04-02 DIAGNOSIS — R1031 Right lower quadrant pain: Secondary | ICD-10-CM | POA: Insufficient documentation

## 2020-04-02 DIAGNOSIS — R197 Diarrhea, unspecified: Secondary | ICD-10-CM | POA: Diagnosis not present

## 2020-04-02 DIAGNOSIS — R109 Unspecified abdominal pain: Secondary | ICD-10-CM | POA: Diagnosis not present

## 2020-04-02 DIAGNOSIS — R110 Nausea: Secondary | ICD-10-CM | POA: Insufficient documentation

## 2020-04-02 LAB — COMPREHENSIVE METABOLIC PANEL WITH GFR
ALT: 24 U/L (ref 0–44)
AST: 24 U/L (ref 15–41)
Albumin: 3.9 g/dL (ref 3.5–5.0)
Alkaline Phosphatase: 56 U/L (ref 38–126)
Anion gap: 6 (ref 5–15)
BUN: 12 mg/dL (ref 6–20)
CO2: 23 mmol/L (ref 22–32)
Calcium: 8.8 mg/dL — ABNORMAL LOW (ref 8.9–10.3)
Chloride: 108 mmol/L (ref 98–111)
Creatinine, Ser: 0.77 mg/dL (ref 0.44–1.00)
GFR, Estimated: >60 mL/min
Glucose, Bld: 94 mg/dL (ref 70–99)
Potassium: 4.3 mmol/L (ref 3.5–5.1)
Sodium: 137 mmol/L (ref 135–145)
Total Bilirubin: 0.5 mg/dL (ref 0.3–1.2)
Total Protein: 7.8 g/dL (ref 6.5–8.1)

## 2020-04-02 LAB — CBC
HCT: 43.2 % (ref 36.0–46.0)
Hemoglobin: 14.6 g/dL (ref 12.0–15.0)
MCH: 30.9 pg (ref 26.0–34.0)
MCHC: 33.8 g/dL (ref 30.0–36.0)
MCV: 91.5 fL (ref 80.0–100.0)
Platelets: 296 K/uL (ref 150–400)
RBC: 4.72 MIL/uL (ref 3.87–5.11)
RDW: 12.5 % (ref 11.5–15.5)
WBC: 6.6 K/uL (ref 4.0–10.5)
nRBC: 0 % (ref 0.0–0.2)

## 2020-04-02 LAB — WET PREP, GENITAL
Clue Cells Wet Prep HPF POC: NONE SEEN
Sperm: NONE SEEN
Trich, Wet Prep: NONE SEEN
Yeast Wet Prep HPF POC: NONE SEEN

## 2020-04-02 LAB — URINALYSIS, ROUTINE W REFLEX MICROSCOPIC
Bilirubin Urine: NEGATIVE
Glucose, UA: NEGATIVE mg/dL
Hgb urine dipstick: NEGATIVE
Ketones, ur: NEGATIVE mg/dL
Nitrite: NEGATIVE
Protein, ur: NEGATIVE mg/dL
Specific Gravity, Urine: 1.026 (ref 1.005–1.030)
WBC, UA: >50 WBC/hpf — ABNORMAL HIGH (ref 0–5)
pH: 7 (ref 5.0–8.0)

## 2020-04-02 LAB — LIPASE, BLOOD: Lipase: 41 U/L (ref 11–51)

## 2020-04-02 LAB — POC URINE PREG, ED: Preg Test, Ur: NEGATIVE

## 2020-04-02 MED ORDER — NAPROXEN 500 MG PO TABS: 500.0000 mg | ORAL_TABLET | Freq: Two times a day (BID) | ORAL | 0 refills | Status: DC

## 2020-04-02 MED ORDER — ONDANSETRON 4 MG PO TBDP: 4.0000 mg | ORAL_TABLET | Freq: Three times a day (TID) | ORAL | 0 refills | Status: DC | PRN

## 2020-04-02 MED ORDER — IOHEXOL 300 MG/ML  SOLN
100.0000 mL | Freq: Once | INTRAMUSCULAR | Status: AC | PRN
  Administered 2020-04-02: 100 mL via INTRAVENOUS

## 2020-04-02 NOTE — ED Triage Notes (Signed)
Right lower quadrant pain for over a week, referred by PCP

## 2020-04-02 NOTE — ED Provider Notes (Signed)
[facility] EMERGENCY DEPARTMENT Provider Note   CSN: [id number] Arrival date & time: 04/02/20  1625     History Chief Complaint  Patient presents with  . Abdominal Pain    Jody Hansen is a 25 y.o. female with no significant past medical history presents to the ED due to right lower quadrant pain x1 week.  Patient was evaluated by her PCP prior to arrival and sent to the ED for further evaluation to rule out appendicitis.  Patient states pain has been present for the past week associated with nausea and one episode of nonbloody diarrhea. No emesis. She described pain as an achy and dull sensation that started generalized and became focalized to RLQ over the past few days. Denies fever and chills. Admits to decrease in appetite. Denies vaginal and urinary symptoms.  She is currently sexually active with one partner without protection.  No concern for STDs.  Patient states she has a had an ovarian cysts before that caused similar pain. No previous abdominal operations. No treatment prior to arrival. No aggravating or alleviating factors.   History obtained from patient and past medical records. No interpreter used during encounter.      Past Medical History:  Diagnosis Date  . Kidney stones 2017  . Pregnant 01/23/2015  . Scoliosis     Patient Active Problem List   Diagnosis Date Noted  . Right lower quadrant abdominal pain 04/02/2020  . Nausea 04/02/2020  . Sore throat 11/08/2019  . History of shoulder dystocia in prior pregnancy 09/10/2019  . Postpartum depression 08/28/2019  . Migraines with aura 06/02/2016  . Ganglion cyst 08/10/2015  . Renal colic on right side [id number]    Past Surgical History:  Procedure Laterality Date  . NO PAST SURGERIES       OB History    Gravida  3   Para  2   Term  2   Preterm      AB  1   Living  2     SAB  1   IAB      Ectopic      Multiple  0   Live Births  2           Family History  Problem Relation  Age of Onset  . Diabetes Paternal Grandfather   . Diabetes Paternal Grandmother   . Cancer Maternal Grandmother   . Cancer Maternal Grandfather   . Diabetes Father   . Diabetes Mother   . Diabetes Sister        borderline  . Lung disease Paternal Aunt     Social History   Tobacco Use  . Smoking status: Never Smoker  . Smokeless tobacco: Never Used  Vaping Use  . Vaping Use: Never used  Substance Use Topics  . Alcohol use: No  . Drug use: No    Home Medications Prior to Admission medications   Medication Sig Start Date End Date Taking? Authorizing Provider  naproxen (NAPROSYN) 500 MG tablet Take 1 tablet (500 mg total) by mouth 2 (two) times daily. 04/02/20  Yes Lauramae Kneisley C, PA-C  ondansetron (ZOFRAN ODT) 4 MG disintegrating tablet Take 1 tablet (4 mg total) by mouth every 8 (eight) hours as needed for nausea or vomiting. 04/02/20  Yes Suzy Bouchard, PA-C  Blood Pressure Monitor MISC For regular home bp monitoring during pregnancy 02/11/19   Roma Schanz, CNM  escitalopram (LEXAPRO) 20 MG tablet Take 1 tablet (20 mg total) by mouth  daily. 01/15/20   Roma Schanz, CNM  QUASENSE 0.15-0.03 MG tablet TAKE ONE TABLET BY MOUTH DAILY. 08/28/19   Roma Schanz, CNM    Allergies    Depo-provera [medroxyprogesterone acetate]  Review of Systems   Review of Systems  Constitutional: Negative for chills and fever.  Respiratory: Negative for shortness of breath.   Cardiovascular: Negative for chest pain.  Gastrointestinal: Positive for abdominal pain, diarrhea and nausea. Negative for vomiting.  Genitourinary: Negative for dysuria and vaginal discharge.  All other systems reviewed and are negative.   Physical Exam Updated Vital Signs BP 119/67   Pulse 85   Temp 98.1 F (36.7 C)   Resp 16   Ht 5\' 2"  (1.575 m)   Wt 92.1 kg   SpO2 99%   BMI 37.13 kg/m   Physical Exam Vitals and nursing note reviewed.  Constitutional:      General: She is not in  acute distress.    Appearance: She is not ill-appearing.  HENT:     Head: Normocephalic.  Eyes:     Pupils: Pupils are equal, round, and reactive to light.  Cardiovascular:     Rate and Rhythm: Normal rate and regular rhythm.     Pulses: Normal pulses.     Heart sounds: Normal heart sounds. No murmur heard. No friction rub. No gallop.   Pulmonary:     Effort: Pulmonary effort is normal.     Breath sounds: Normal breath sounds.  Abdominal:     General: Abdomen is flat. Bowel sounds are normal. There is no distension.     Palpations: Abdomen is soft.     Tenderness: There is abdominal tenderness. There is no guarding or rebound.     Comments: Very mild tenderness in RLQ. No rebound or guarding. Negative psoas sign. Negative obturator sign.   Genitourinary:    Comments: Pelvic exam performed with chaperone in room.  Mild amount of white discharge.  No CMT.  No adnexal masses or tenderness. Musculoskeletal:        General: Normal range of motion.     Cervical back: Neck supple.  Skin:    General: Skin is warm and dry.  Neurological:     General: No focal deficit present.     Mental Status: She is alert.  Psychiatric:        Mood and Affect: Mood normal.        Behavior: Behavior normal.     ED Results / Procedures / Treatments   Labs (all labs ordered are listed, but only abnormal results are displayed) Labs Reviewed  WET PREP, GENITAL - Abnormal; Notable for the following components:      Result Value   WBC, Wet Prep HPF POC MANY (*)    All other components within normal limits  COMPREHENSIVE METABOLIC PANEL - Abnormal; Notable for the following components:   Calcium 8.8 (*)    All other components within normal limits  URINALYSIS, ROUTINE W REFLEX MICROSCOPIC - Abnormal; Notable for the following components:   APPearance HAZY (*)    Leukocytes,Ua MODERATE (*)    WBC, UA >50 (*)    Bacteria, UA RARE (*)    All other components within normal limits  LIPASE, BLOOD  CBC   POC URINE PREG, ED  GC/CHLAMYDIA PROBE AMP ([place]) NOT AT [facility]    EKG None  Radiology CT ABDOMEN PELVIS W CONTRAST  Result Date: 04/02/2020 CLINICAL DATA:  Right lower quadrant abdominal pain and nausea for  1 week. EXAM: CT ABDOMEN AND PELVIS WITH CONTRAST TECHNIQUE: Multidetector CT imaging of the abdomen and pelvis was performed using the standard protocol following bolus administration of intravenous contrast. CONTRAST:  151mL OMNIPAQUE IOHEXOL 300 MG/ML  SOLN COMPARISON:  06/30/2012 FINDINGS: Lower chest: Lung bases are clear. Hepatobiliary: No focal liver abnormality is seen. No gallstones, gallbladder wall thickening, or biliary dilatation. Pancreas: Unremarkable. No pancreatic ductal dilatation or surrounding inflammatory changes. Spleen: Normal in size without focal abnormality. Adrenals/Urinary Tract: No adrenal gland nodules. Stone in the lower pole right kidney measuring 3 mm diameter. No hydronephrosis or hydroureter. Nephrograms are homogeneous and symmetrical. Bladder is normal. Stomach/Bowel: Stomach is within normal limits. Appendix appears normal. No evidence of bowel wall thickening, distention, or inflammatory changes. Vascular/Lymphatic: No significant vascular findings are present. No enlarged abdominal or pelvic lymph nodes. Reproductive: Uterus and bilateral adnexa are unremarkable. Other: No abdominal wall hernia or abnormality. No abdominopelvic ascites. Musculoskeletal: No acute or significant osseous findings. IMPRESSION: 1. No acute process demonstrated in the abdomen or pelvis. No evidence of bowel obstruction or inflammation. Appendix is normal. 2. Nonobstructing stone in the lower pole right kidney. Electronically Signed   By: Lucienne Capers M.D.   On: 04/02/2020 20:53    Procedures Procedures   Medications Ordered in ED Medications  iohexol (OMNIPAQUE) 300 MG/ML solution 100 mL (100 mLs Intravenous Contrast Given 04/02/20 2049)    ED Course  I have  reviewed the triage vital signs and the nursing notes.  Pertinent labs & imaging results that were available during my care of the patient were reviewed by me and considered in my medical decision making (see chart for details).  Clinical Course as of 04/02/20 2129  Thu Apr 02, 2020  1910 Preg Test, Ur: NEGATIVE [CA]  1910 WBC, Wet Prep HPF POC(!): MANY [CA]    Clinical Course User Index [CA] Suzy Bouchard, PA-C   MDM Rules/Calculators/A&P                         25 year old female presents to the ED from PCP due to right lower quadrant abdominal pain to rule out appendicitis.  Vitals all within normal limits.  Patient is afebrile, not tachycardic or hypoxic.  Patient in no acute distress and nonill appearing.  Physical exam significant for very mild tenderness to palpation in right lower quadrant without rebound or guarding.  Negative psoas and obturator sign.  Routine labs ordered to rule out infectious etiology and electrolyte abnormalities.  UA to rule out acute cystitis. CT abdomen to rule out appendicitis given PCP concern; however, suspicion is lower given exam findings.  Pelvic exam performed with chaperone in the room which demonstrated mild amount of white discharge.  No CMT or adnexal masses/tenderness.  Doubt PID.  CBC unremarkable no leukocytosis and normal hemoglobin.  Lipase normal at 41.  Doubt pancreatitis.  UA significant for moderate leukocytes and rare bacteria.  Wet prep significant for many white blood cells, but otherwise unremarkable.  CMP reassuring with normal renal function no major electrolyte derangements.  Pregnancy test negative.  Gonorrhea/Chlamydia pending.  CT abdomen personally reviewed which demonstrates: IMPRESSION:  1. No acute process demonstrated in the abdomen or pelvis. No  evidence of bowel obstruction or inflammation. Appendix is normal.  2. Nonobstructing stone in the lower pole right kidney.   Patient discharged with naproxen and zofran.   Follow-up with PCP symptoms not improved within the next week. Strict ED precautions discussed with  patient. Patient states understanding and agrees to plan. Patient discharged home in no acute distress and stable vitals Final Clinical Impression(s) / ED Diagnoses Final diagnoses:  RLQ abdominal pain    Rx / DC Orders ED Discharge Orders         Ordered    naproxen (NAPROSYN) 500 MG tablet  2 times daily        04/02/20 2103    ondansetron (ZOFRAN ODT) 4 MG disintegrating tablet  Every 8 hours PRN        04/02/20 2128           Karie Kirks 04/02/20 2129    Noemi Chapel, MD 04/03/20 [phone]

## 2020-04-02 NOTE — Progress Notes (Signed)
   Patient ID: Jody Hansen, female    DOB: 01/23/1996, 25 y.o.   MRN: [id number]   No chief complaint on file.  Subjective:  CC: "Stomach ache "  This is a new problem.  Presents today for an acute visit with a complaint of "stomachache "describes as achy feeling, painful in the middle around bellybutton.  Symptoms have been present for 1 week.  Denies fever, chills.  Reports she is on birth control, has tested for pregnancy x2 with a negative result.  She is able to eat, drink adequately no vomiting.    stomach ache for over one week. Pain in mid abdomen. Had diarrhea on one day. Has not tried any treatments.    Medical History [place] has a past medical history of Kidney stones (2017), Pregnant (01/23/2015), and Scoliosis.   Outpatient Encounter Medications as of 04/02/2020  Medication Sig  . Blood Pressure Monitor MISC For regular home bp monitoring during pregnancy  . escitalopram (LEXAPRO) 20 MG tablet Take 1 tablet (20 mg total) by mouth daily.  . QUASENSE 0.15-0.03 MG tablet TAKE ONE TABLET BY MOUTH DAILY.   No facility-administered encounter medications on file as of 04/02/2020.     Review of Systems  Constitutional: Positive for fatigue. Negative for chills and fever.  HENT: Positive for congestion. Negative for sore throat.   Respiratory: Negative for cough and shortness of breath.   Cardiovascular: Negative for chest pain.  Gastrointestinal: Positive for abdominal pain, diarrhea and nausea. Negative for vomiting.  Genitourinary: Negative for dysuria.  Musculoskeletal: Positive for back pain.       Sore   Neurological: Positive for headaches. Negative for dizziness.     Vitals Pulse 91   Temp 97.6 F (36.4 C)   Resp 16   SpO2 98%   Objective:   Physical Exam Vitals reviewed.  Constitutional:      General: She is not in acute distress.    Appearance: Normal appearance.  Cardiovascular:     Rate and Rhythm: Normal rate and regular rhythm.     Heart  sounds: Normal heart sounds.  Pulmonary:     Effort: Pulmonary effort is normal.     Breath sounds: Normal breath sounds.  Abdominal:     Tenderness: There is abdominal tenderness in the right lower quadrant. There is no rebound.  Skin:    General: Skin is warm and dry.  Neurological:     General: No focal deficit present.     Mental Status: She is alert.  Psychiatric:        Behavior: Behavior normal.      Assessment and Plan   1. Right lower quadrant abdominal pain  2. Nausea   Right lower quadrant pain/tenderness upon palpation Nausea/diarrhea Low appetite Symptoms x 1 week One birth control, pregnancy test negative x 2  Recommend [facility] ED for possible appendicitis. Nurse to notify triage. Will take child for care before going to ED.  Agrees with plan of care discussed today. Understands warning signs to seek further care: chest pain, shortness of breath, any significant change in health.  Understands to follow-up at the emergency department for possible appendicitis.  She has her daughter with her at the office, she will take daughter to family member for care prior to going to the emergency department.    Jody Ades, NP 04/02/2020

## 2020-04-02 NOTE — Discharge Instructions (Signed)
As discussed, your CT scan was normal.  All of your labs are reassuring today.  I am sending you home with a pain medication.  Take as needed.  Please follow-up with PCP if symptoms not improved within the next week.  Return to the ER for new or worsening symptoms.

## 2020-04-03 ENCOUNTER — Telehealth: Payer: Self-pay

## 2020-04-03 LAB — GC/CHLAMYDIA PROBE AMP (~~LOC~~) NOT AT ARMC
Chlamydia: NEGATIVE
Comment: NEGATIVE
Comment: NORMAL
Neisseria Gonorrhea: NEGATIVE

## 2020-04-03 NOTE — Telephone Encounter (Signed)
Transition Care Management Unsuccessful Follow-up Telephone Call  Date of discharge and from where:  04/02/2020  Attempts:  1st Attempt  Reason for unsuccessful TCM follow-up call:  Left voice message

## 2020-04-03 NOTE — Telephone Encounter (Signed)
Transition Care Management Follow-up Telephone Call  Date of discharge and from where: 04/02/2020 [facility]  How have you been since you were released from the hospital? Doing okay.   Any questions or concerns? No  Items Reviewed:  Did the pt receive and understand the discharge instructions provided? Yes   Medications obtained and verified? Yes   Other? No   Any new allergies since your discharge? No   Dietary orders reviewed? Yes  Do you have support at home? Yes    Functional Questionnaire: (I = Independent and D = Dependent) ADLs: I  Bathing/Dressing- I  Meal Prep- I  Eating- I  Maintaining continence- I  Transferring/Ambulation- I  Managing Meds- I  Follow up appointments reviewed:   PCP Hospital f/u appt confirmed? No  Patient stated no appt needed right now. Confirmed Sallee Lange, MD PCP will contact for appt if needed.  [facility] f/u appt confirmed? No    Are transportation arrangements needed? No   If their condition worsens, is the pt aware to call PCP or go to the Emergency Dept.? Yes  Was the patient provided with contact information for the PCP's office or ED? Yes  Was to pt encouraged to call back with questions or concerns? Yes

## 2020-04-04 LAB — SPECIMEN STATUS REPORT

## 2020-04-04 LAB — NOVEL CORONAVIRUS, NAA: SARS-CoV-2, NAA: NOT DETECTED

## 2020-04-04 LAB — SARS-COV-2, NAA 2 DAY TAT

## 2020-04-24 ENCOUNTER — Encounter: Payer: Self-pay | Admitting: Family Medicine

## 2020-04-28 ENCOUNTER — Encounter: Payer: Self-pay | Admitting: Women's Health

## 2020-04-28 ENCOUNTER — Other Ambulatory Visit: Payer: Self-pay

## 2020-04-28 ENCOUNTER — Encounter: Payer: Self-pay | Admitting: *Deleted

## 2020-04-28 ENCOUNTER — Telehealth (INDEPENDENT_AMBULATORY_CARE_PROVIDER_SITE_OTHER): Payer: Medicaid Other | Admitting: Women's Health

## 2020-04-28 ENCOUNTER — Telehealth: Payer: Self-pay | Admitting: Clinical

## 2020-04-28 ENCOUNTER — Other Ambulatory Visit: Payer: Self-pay | Admitting: Women's Health

## 2020-04-28 DIAGNOSIS — F39 Unspecified mood [affective] disorder: Secondary | ICD-10-CM

## 2020-04-28 DIAGNOSIS — F418 Other specified anxiety disorders: Secondary | ICD-10-CM | POA: Diagnosis not present

## 2020-04-28 DIAGNOSIS — F53 Postpartum depression: Secondary | ICD-10-CM | POA: Diagnosis not present

## 2020-04-28 DIAGNOSIS — O99345 Other mental disorders complicating the puerperium: Secondary | ICD-10-CM | POA: Diagnosis not present

## 2020-04-28 DIAGNOSIS — R454 Irritability and anger: Secondary | ICD-10-CM | POA: Diagnosis not present

## 2020-04-28 MED ORDER — CITALOPRAM HYDROBROMIDE 20 MG PO TABS: ORAL_TABLET | ORAL | 2 refills | Status: DC

## 2020-04-28 NOTE — Progress Notes (Signed)
[place] VIRTUAL GYN VISIT ENCOUNTER NOTE Patient name: Jody Hansen MRN [id number]  Date of birth: Dec 21, 1995  I connected with patient on 04/28/20 at  4:10 PM EST by MyChart video  and verified that I am speaking with the correct person using two identifiers.  Pt is not currently in the office, she is at home.  Provider is in the office.    I discussed the limitations, risks, security and privacy concerns of performing an evaluation and management service by telephone and the availability of in person appointments. I also discussed with the patient that there may be a patient responsible charge related to this service. The patient expressed understanding and agreed to proceed.   Chief Complaint:   Depression  History of Present Illness:   Jody Hansen is a 25 y.o. [id number] Caucasian female being evaluated today for f/u on dep/anx. 15mhs postpartum. Started on Lexapro in Oct, increased to 252min No12-06-2024was doing well until recently. Feels dep/anx/anger has worsened and lexapro not helping at all. Denies SI/HI/II, but just wonders 'why she's here'. Eats a lot, sleeps a lot, does not find joy in things she used to. Father died in No12/06/24and feels it has made things worse. Thinks she could be bipolar. Met w/ Jamie/IBHin Oct, was going to refer to psychiatry. Pt didn't keep appt. Does want f/u now.  [facility] 04/28[facility]2020 01/02/2020 10/21/2019 02/11/2019 12/14/2018  Decreased Interest _0 0 3  Down, Depressed, Hopeless _1 0 3  PHQ - 2 Score _2 0 6  Altered sleeping _3 Tired, decreased energy _4 Change in appetite _5 Feeling bad or failure about yourself  _6 0 2  Trouble concentrating 3 1 0 0 1  Moving slowly or fidgety/restless _7 0 0  Suicidal thoughts 2 0 0 0 0  PHQ-9 Score _8 Difficult doing work/chores - - Extremely dIfficult - -    No LMP recorded. (Menstrual status: Oral contraceptives). The current method of  family planning is OCP (estrogen/progesterone).  Last pap 06/13/17. Results were:  NILM w/ HRHPV not done Review of Systems:   Pertinent items are noted in HPI Denies fever/chills, dizziness, headaches, visual disturbances, fatigue, shortness of breath, chest pain, abdominal pain, vomiting, abnormal vaginal discharge/itching/odor/irritation, problems with periods, bowel movements, urination, or intercourse unless otherwise stated above.  Pertinent History Reviewed:  Reviewed past medical,surgical, social, obstetrical and family history.  Reviewed problem list, medications and allergies. Physical Assessment:  There were no vitals filed for this visit.There is no height or weight on file to calculate BMI.       Physical Examination:   General:  Alert, oriented and cooperative.   Mental Status: Flat affect. Normal judgment and thought content.  Physical exam deferred due to nature of the encounter  No results found for this or any previous visit (from the past 24 hour(s)).  Assessment & Plan:  1) Dep/anx/anger> stop lexapro, rx celexa, f/u 4wks. Note sent to Jamie/IBH to reach back out to pt/get in w/ psychiatry for med management, etc  Meds:  Meds ordered this encounter  Medications  . citalopram (CELEXA) 20 MG tablet    Sig: Take 1024m1/2 tablet) daily x 1 week, then 95m25m tablet) daily thereafter    Dispense:  30 tablet    Refill:  2  Order Specific Question:   Supervising Provider    Answer:   Tania Ade H [[id number]]    No orders of the defined types were placed in this encounter.   I discussed the assessment and treatment plan with the patient. The patient was provided an opportunity to ask questions and all were answered. The patient agreed with the plan and demonstrated an understanding of the instructions.   The patient was advised to call back or seek an in-person evaluation/go to the ED if the symptoms worsen or if the condition fails to improve as anticipated.  I  provided 15 minutes of non-face-to-face time during this encounter.   Return in about 4 weeks (around 05/26/2020) for F/U, MyChart Video, with me.  [place], [facility] 04/28/2020 4:53 PM

## 2020-04-28 NOTE — Telephone Encounter (Signed)
Pt agrees to putting in another referral to psychiatry,  and is aware she may go to [facility] 24/7, if needed. Also agrees with virtual visit with [facility] Cornelious Diven on 05/14/20 at 1:15pm.

## 2020-05-01 NOTE — BH Specialist Note (Signed)
Pt did not arrive to video visit and did not answer the phone; Left HIPPA-compliant message to call back Annarose Ouellet from [facility] at  [phone] (Emmry Hinsch's office).  ?; left MyChart message for patient.  ? ?

## 2020-05-14 ENCOUNTER — Ambulatory Visit: Payer: Medicaid Other | Admitting: Clinical

## 2020-05-14 DIAGNOSIS — Z5329 Procedure and treatment not carried out because of patient's decision for other reasons: Secondary | ICD-10-CM

## 2020-05-14 DIAGNOSIS — Z91199 Patient's noncompliance with other medical treatment and regimen due to unspecified reason: Secondary | ICD-10-CM

## 2020-05-19 ENCOUNTER — Other Ambulatory Visit: Payer: Self-pay

## 2020-05-19 ENCOUNTER — Ambulatory Visit (HOSPITAL_COMMUNITY)
Admission: EM | Admit: 2020-05-19 | Discharge: 2020-05-19 | Disposition: A | Discharge status: Home / Self Care | Payer: Medicaid Other | Attending: Urgent Care | Admitting: Urgent Care

## 2020-05-19 ENCOUNTER — Encounter (HOSPITAL_COMMUNITY): Payer: Self-pay

## 2020-05-19 ENCOUNTER — Ambulatory Visit (INDEPENDENT_AMBULATORY_CARE_PROVIDER_SITE_OTHER): Payer: Medicaid Other

## 2020-05-19 DIAGNOSIS — M25571 Pain in right ankle and joints of right foot: Secondary | ICD-10-CM

## 2020-05-19 DIAGNOSIS — M7989 Other specified soft tissue disorders: Secondary | ICD-10-CM | POA: Diagnosis not present

## 2020-05-19 DIAGNOSIS — S99911A Unspecified injury of right ankle, initial encounter: Secondary | ICD-10-CM

## 2020-05-19 DIAGNOSIS — S93401A Sprain of unspecified ligament of right ankle, initial encounter: Secondary | ICD-10-CM

## 2020-05-19 MED ORDER — NAPROXEN 500 MG PO TABS: 500.0000 mg | ORAL_TABLET | Freq: Two times a day (BID) | ORAL | 0 refills | Status: DC

## 2020-05-19 NOTE — ED Triage Notes (Signed)
Pt reports falling today and hearing a crack. Pt states it hurts to move her right ankle.

## 2020-05-19 NOTE — ED Provider Notes (Signed)
[place]   MRN: [id number] DOB: April 29, 1995  Subjective:   Jody Hansen is a 25 y.o. female presenting for 1 day history of acute onset right ankle pain and swelling. She was working, turned her ankle and fell from pain, heard a pop. Has not been able to bear weight. Has not tried any medications.   No current facility-administered medications for this encounter.  Current Outpatient Medications:  .  Blood Pressure Monitor MISC, For regular home bp monitoring during pregnancy (Patient not taking: Reported on 04/28/2020), Disp: 1 each, Rfl: 0 .  citalopram (CELEXA) 20 MG tablet, Take 10mg  (1/2 tablet) daily x 1 week, then 20mg  (1 tablet) daily thereafter, Disp: 30 tablet, Rfl: 2 .  ondansetron (ZOFRAN ODT) 4 MG disintegrating tablet, Take 1 tablet (4 mg total) by mouth every 8 (eight) hours as needed for nausea or vomiting. (Patient not taking: Reported on 04/28/2020), Disp: 20 tablet, Rfl: 0 .  QUASENSE 0.15-0.03 MG tablet, TAKE ONE TABLET BY MOUTH DAILY., Disp: 91 tablet, Rfl: 3   Allergies  Allergen Reactions  . Depo-Provera [Medroxyprogesterone Acetate] Other (See Comments)    Weight gain; bruising; headaches    Past Medical History:  Diagnosis Date  . Kidney stones 2017  . Pregnant 01/23/2015  . Scoliosis      Past Surgical History:  Procedure Laterality Date  . NO PAST SURGERIES      Family History  Problem Relation Age of Onset  . Diabetes Paternal Grandfather   . Diabetes Paternal Grandmother   . Cancer Maternal Grandmother   . Cancer Maternal Grandfather   . Diabetes Father   . Diabetes Mother   . Diabetes Sister        borderline  . Lung disease Paternal Aunt     Social History   Tobacco Use  . Smoking status: Never Smoker  . Smokeless tobacco: Never Used  Vaping Use  . Vaping Use: Never used  Substance Use Topics  . Alcohol use: No  . Drug use: No    ROS   Objective:   Vitals: BP 101/60 (BP Location: Right Arm)    Pulse 99   Temp 99 F (37.2 C) (Oral)   Resp 18   SpO2 100%   Physical Exam Constitutional:      General: She is not in acute distress.    Appearance: Normal appearance. She is well-developed. She is not ill-appearing, toxic-appearing or diaphoretic.  HENT:     Head: Normocephalic and atraumatic.     Nose: Nose normal.     Mouth/Throat:     Mouth: Mucous membranes are moist.     Pharynx: Oropharynx is clear.  Eyes:     General: No scleral icterus.       Right eye: No discharge.        Left eye: No discharge.     Extraocular Movements: Extraocular movements intact.     Conjunctiva/sclera: Conjunctivae normal.     Pupils: Pupils are equal, round, and reactive to light.  Cardiovascular:     Rate and Rhythm: Normal rate.  Pulmonary:     Effort: Pulmonary effort is normal.  Musculoskeletal:     Right ankle: Swelling present. No deformity, ecchymosis or lacerations. Tenderness present over the lateral malleolus, ATF ligament and AITF ligament. Decreased range of motion.     Right Achilles Tendon: No tenderness or defects.  Skin:    General: Skin is warm and dry.  Neurological:     General:  No focal deficit present.     Mental Status: She is alert and oriented to person, place, and time.     Motor: No weakness.     Coordination: Coordination normal.     Gait: Gait normal.     Deep Tendon Reflexes: Reflexes normal.  Psychiatric:        Mood and Affect: Mood normal.        Behavior: Behavior normal.        Thought Content: Thought content normal.        Judgment: Judgment normal.     DG Ankle Complete Right  Result Date: 05/19/2020 CLINICAL DATA:  Right ankle injury.  Pain. EXAM: RIGHT ANKLE - COMPLETE 3+ VIEW COMPARISON:  None. FINDINGS: No fracture. No subluxation or dislocation. Soft tissue swelling evident. IMPRESSION: Soft tissue swelling without acute bony abnormality. Electronically Signed   By: Misty Stanley M.D.   On: 05/19/2020 19:41   Right ankle wrapped using 4"  Ace wrap in figure-8 method.   Assessment and Plan :   PDMP not reviewed this encounter.  1. Sprain of right ankle, unspecified ligament, initial encounter   2. Acute right ankle pain     Will manage for ankle sprain with rice method, NSAID. Counseled patient on potential for adverse effects with medications prescribed/recommended today, ER and return-to-clinic precautions discussed, patient verbalized understanding.   Jaynee Eagles, PA-C 05/22/20 1011

## 2020-05-25 ENCOUNTER — Other Ambulatory Visit: Payer: Self-pay

## 2020-05-25 ENCOUNTER — Encounter: Payer: Self-pay | Admitting: Women's Health

## 2020-05-25 ENCOUNTER — Telehealth (INDEPENDENT_AMBULATORY_CARE_PROVIDER_SITE_OTHER): Payer: Medicaid Other | Admitting: Women's Health

## 2020-05-25 DIAGNOSIS — F418 Other specified anxiety disorders: Secondary | ICD-10-CM | POA: Diagnosis not present

## 2020-05-25 NOTE — Progress Notes (Signed)
   [place] VISIT ENCOUNTER NOTE Patient name: Jody Hansen MRN [id number]  Date of birth: 11/14/1995  I connected with patient on 05/25/20 at  1:50 PM EDT by phone (unable to do video visit right now) and verified that I am speaking with the correct person using two identifiers.  Pt is not currently in the office, she is in her car, working as Development worker, community carrier.  Provider is in the office.    I discussed the limitations, risks, security and privacy concerns of performing an evaluation and management service by telephone and the availability of in person appointments. I also discussed with the patient that there may be a patient responsible charge related to this service. The patient expressed understanding and agreed to proceed.   Chief Complaint:   Follow-up  History of Present Illness:   Jody Hansen is a 25 y.o. [phone] female being evaluated today for f/u .    Depression screen [facility] 2/9 05/25/2020 04/28/2020 01/02/2020 10/21/2019 02/11/2019  Decreased Interest 0 3 3 2  0  Down, Depressed, Hopeless 0 3 3 1  0  PHQ - 2 Score 0 6 6 3  0  Altered sleeping 0 3 3 3 3   Tired, decreased energy 1 3 3 2 2   Change in appetite 0 1 3 3 2   Feeling bad or failure about yourself  0 3 3 2  0  Trouble concentrating 0 3 1 0 0  Moving slowly or fidgety/restless 0 3 3 1  0  Suicidal thoughts 0 2 0 0 0  PHQ-9 Score 1 24 22 14 7   Difficult doing work/chores - - - Extremely dIfficult -    No LMP recorded. (Menstrual status: Oral contraceptives). The current method of family planning is OCP (estrogen/progesterone).  Last pap 06/13/17. Results were:  NILM w/ HRHPV not done Review of Systems:   Pertinent items are noted in HPI Denies fever/chills, dizziness, headaches, visual disturbances, fatigue, shortness of breath, chest pain, abdominal pain, vomiting, abnormal vaginal discharge/itching/odor/irritation, problems with periods, bowel movements, urination, or intercourse unless otherwise stated  above.  Pertinent History Reviewed:  Reviewed past medical,surgical, social, obstetrical and family history.  Reviewed problem list, medications and allergies. Physical Assessment:  There were no vitals filed for this visit.There is no height or weight on file to calculate BMI.       Physical Examination:   General:  Alert, oriented and cooperative.   Mental Status: Normal mood and affect perceived. Normal judgment and thought content.  Physical exam deferred due to nature of the encounter  No results found for this or any previous visit (from the past 24 hour(s)).  Assessment & Plan:  1) Dep/anx> much improved on celexa 20mg , continue  Meds: No orders of the defined types were placed in this encounter.   No orders of the defined types were placed in this encounter.   I discussed the assessment and treatment plan with the patient. The patient was provided an opportunity to ask questions and all were answered. The patient agreed with the plan and demonstrated an understanding of the instructions.   The patient was advised to call back or seek an in-person evaluation/go to the ED if the symptoms worsen or if the condition fails to improve as anticipated.  I provided 10 minutes of non-face-to-face time during this encounter.   Return in about 4 weeks (around 06/22/2020) for Pap & physical.  Roma Schanz CNM, [facility] 05/25/2020 2:08 PM

## 2020-05-28 ENCOUNTER — Telehealth: Payer: Self-pay

## 2020-05-28 NOTE — Telephone Encounter (Signed)
Pt called stating that she is on day 3 of he placebo BC pills and her cycle has not started yet, pt also stated that she doesn't consisently take her BC pills and that she did have intercourse near the time that she that she missed pills. Pt states she is unsure if she may be pregnant, but is not having normal menstrual cycle symptoms such as cramping and bloating.  Pt also stated that she messaged [name] and has not gotten a response and I informed the pt that Maudie Mercury was not in the office

## 2020-06-01 ENCOUNTER — Other Ambulatory Visit: Payer: Self-pay

## 2020-06-01 ENCOUNTER — Ambulatory Visit: Payer: Medicaid Other | Admitting: Family Medicine

## 2020-06-01 ENCOUNTER — Encounter: Payer: Self-pay | Admitting: Family Medicine

## 2020-06-01 VITALS — BP 139/83 | HR 73 | Temp 97.9°F | Wt 205.4 lb

## 2020-06-01 DIAGNOSIS — G4452 New daily persistent headache (NDPH): Secondary | ICD-10-CM | POA: Diagnosis not present

## 2020-06-01 DIAGNOSIS — J349 Unspecified disorder of nose and nasal sinuses: Secondary | ICD-10-CM

## 2020-06-01 NOTE — Patient Instructions (Signed)
If you have any symptoms with vision changes or loss of sensation (or anything that concerns you) GO TO THE EMERGENCY ROOM IMMEDIATELY FOR EVALUATION.    General Headache Without Cause A headache is pain or discomfort felt around the head or neck area. The specific cause of a headache may not be found. There are many causes and types of headaches. A few common ones are:  Tension headaches.  Migraine headaches.  Cluster headaches.  Chronic daily headaches. Follow these instructions at home: Watch your condition for any changes. Let your health care provider know about them. Take these steps to help with your condition: Managing pain  Take over-the-counter and prescription medicines only as told by your health care provider.  Lie down in a dark, quiet room when you have a headache.  If directed, put ice on your head and neck area: ? Put ice in a plastic bag. ? Place a towel between your skin and the bag. ? Leave the ice on for 20 minutes, 2-3 times per day.  If directed, apply heat to the affected area. Use the heat source that your health care provider recommends, such as a moist heat pack or a heating pad. ? Place a towel between your skin and the heat source. ? Leave the heat on for 20-30 minutes. ? Remove the heat if your skin turns bright red. This is especially important if you are unable to feel pain, heat, or cold. You may have a greater risk of getting burned.  Keep lights dim if bright lights bother you or make your headaches worse.      Eating and drinking  Eat meals on a regular schedule.  If you drink alcohol: ? Limit how much you use to:  0-1 drink a day for women.  0-2 drinks a day for men. ? Be aware of how much alcohol is in your drink. In the [place] one drink equals one 12 oz bottle of beer (355 mL), one 5 oz glass of wine (148 mL), or one 1 oz glass of hard liquor (44 mL).  Stop drinking caffeine, or decrease the amount of caffeine you drink. General  instructions  Keep a headache journal to help find out what may trigger your headaches. For example, write down: ? What you eat and drink. ? How much sleep you get. ? Any change to your diet or medicines.  Try massage or other relaxation techniques.  Limit stress.  Sit up straight, and do not tense your muscles.  Do not use any products that contain nicotine or tobacco, such as cigarettes, e-cigarettes, and chewing tobacco. If you need help quitting, ask your health care provider.  Exercise regularly as told by your health care provider.  Sleep on a regular schedule. Get 7-9 hours of sleep each night, or the amount recommended by your health care provider.  Keep all follow-up visits as told by your health care provider. This is important.   Contact a health care provider if:  Your symptoms are not helped by medicine.  You have a headache that is different from the usual headache.  You have nausea or you vomit.  You have a fever. Get help right away if:  Your headache becomes severe quickly.  Your headache gets worse after moderate to intense physical activity.  You have repeated vomiting.  You have a stiff neck.  You have a loss of vision.  You have problems with speech.  You have pain in the eye or ear.  You have muscular weakness or loss of muscle control.  You lose your balance or have trouble walking.  You feel faint or pass out.  You have confusion.  You have a seizure. Summary  A headache is pain or discomfort felt around the head or neck area.  There are many causes and types of headaches. In some cases, the cause may not be found.  Keep a headache journal to help find out what may trigger your headaches. Watch your condition for any changes. Let your health care provider know about them.  Contact a health care provider if you have a headache that is different from the usual headache, or if your symptoms are not helped by medicine.  Get help  right away if your headache becomes severe, you vomit, you have a loss of vision, you lose your balance, or you have a seizure. This information is not intended to replace advice given to you by your health care provider. Make sure you discuss any questions you have with your health care provider. Document Revised: 09/11/2017 Document Reviewed: 09/11/2017 Elsevier Patient Education  [place].

## 2020-06-01 NOTE — Progress Notes (Signed)
Pt states she lost vision in her left eye on Friday while at work. Pt states she has had a migraine since then. Vision has not went out since Friday. Migraine is on the back and side of head and sometimes feels like someone is tapping on top of her head. Pt did take 800 mg Ibuprofen for Migraine at one time. Pt did feel dizzy also.   Pt sprained right ankle at work. Did go to Urgent Care. Pt states that her ankle still hurts.     Patient ID: Jody Hansen, female    DOB: 01/06/1996, 25 y.o.   MRN: [id number]   Chief Complaint  Patient presents with  . Migraine   Subjective:  CC: new onset headache with vision loss for 10-15 minutes on Friday   This is a new problem.  Presents today with a complaint of new onset headache with complete loss of vision in left eye.  Symptoms started on Friday while at work, informed her employer, she was sent on another nail route.  She did not go to the emergency department.  Her vision loss lasted about 10 to 15 minutes, and her headache started immediately with the vision loss.  She reports that as an infant she had a history migraine headaches.  Has not had a headache in a while until last Friday.  Reports that she has had a headache every day since.  Goes to sleep with a headache, wakes up with a headache.  She is on oral contraceptive estrogen therapy.  Denies fever, endorses chills and the sensation that she cannot catch her breath.    Medical History Gimena has a past medical history of Kidney stones (2017), Pregnant (01/23/2015), and Scoliosis.   Outpatient Encounter Medications as of 06/01/2020  Medication Sig  . citalopram (CELEXA) 20 MG tablet Take 10mg  (1/2 tablet) daily x 1 week, then 20mg  (1 tablet) daily thereafter  . QUASENSE 0.15-0.03 MG tablet TAKE ONE TABLET BY MOUTH DAILY.  . [DISCONTINUED] Blood Pressure Monitor MISC For regular home bp monitoring during pregnancy (Patient not taking: No sig reported)  . [DISCONTINUED] naproxen  (NAPROSYN) 500 MG tablet Take 1 tablet (500 mg total) by mouth 2 (two) times daily with a meal. (Patient not taking: Reported on 05/25/2020)  . [DISCONTINUED] ondansetron (ZOFRAN ODT) 4 MG disintegrating tablet Take 1 tablet (4 mg total) by mouth every 8 (eight) hours as needed for nausea or vomiting. (Patient not taking: No sig reported)   No facility-administered encounter medications on file as of 06/01/2020.     Review of Systems  Constitutional: Positive for chills. Negative for fever.  Eyes: Positive for visual disturbance. Negative for photophobia, pain, discharge and itching.       Was in a dark room and unsure if lights made it worse.   Respiratory: Positive for shortness of breath.        Felt like cannot catch breath once yesterday, this resolved today.   Cardiovascular: Negative for chest pain.  Gastrointestinal: Negative for nausea and vomiting.  Neurological: Positive for dizziness, light-headedness and headaches.       Has had headache everyday since Friday, goes to sleep and wakes up with headache too.      Vitals BP 139/83   Pulse 73   Temp 97.9 F (36.6 C)   Wt 205 lb 6.4 oz (93.2 kg)   SpO2 98%   BMI 37.57 kg/m   Objective:   Physical Exam Vitals reviewed.  Constitutional:  Appearance: Normal appearance.  Cardiovascular:     Rate and Rhythm: Normal rate and regular rhythm.     Heart sounds: Normal heart sounds.  Pulmonary:     Effort: Pulmonary effort is normal.     Breath sounds: Normal breath sounds.  Skin:    General: Skin is warm and dry.  Neurological:     General: No focal deficit present.     Mental Status: She is alert.     Cranial Nerves: No cranial nerve deficit.     Motor: No weakness.     Coordination: Romberg sign positive. Coordination normal. Finger-Nose-Finger Test and Heel to [facility] Test normal.     Comments: Obvious swaying with Romberg (felt the sensation of swaying). 5/5 upper and lower extremity strength.  Psychiatric:         Behavior: Behavior normal.      Assessment and Plan   1. New daily persistent headache - MR Brain Wo Contrast     New onset headache with complete vision loss in left eye for 10-15 minutes on Friday. Headache followed vision loss. Did not go to the emergency.  Neurological exam negative today,  except for sensation of swaying during Romberg.  Will order MRI, send for insurance approval, with strict instructions that if any symptoms recur that involve vision, slurred speech, any neurological symptoms, or any symptoms  that are concerning to report to the emergency room immediately.  She is also instructed to contact her GYN to come off of oral contraceptive estrogen therapy.   Agrees with plan of care discussed today. Understands warning signs to seek further care: chest pain, shortness of breath, any significant change in health.  Understands to follow-up at emergency department with any symptoms while we await insurance for MRI. Make appointment with GYN for alternates for birth control (should not be on estrogen therapy).  Once MRI is completed, if appropriate, will initiate migraine therapy.  Must be sure this is not neurological before migraines can be treated.   Chalmers Guest, NP 06/01/2020

## 2020-06-04 ENCOUNTER — Encounter: Payer: Self-pay | Admitting: Family Medicine

## 2020-06-14 DIAGNOSIS — J301 Allergic rhinitis due to pollen: Secondary | ICD-10-CM | POA: Diagnosis not present

## 2020-06-15 ENCOUNTER — Other Ambulatory Visit: Payer: Self-pay | Admitting: Women's Health

## 2020-06-15 MED ORDER — NORETHINDRONE 0.35 MG PO TABS: 1.0000 | ORAL_TABLET | Freq: Every day | ORAL | 11 refills | Status: DC

## 2020-06-23 ENCOUNTER — Other Ambulatory Visit: Payer: Medicaid Other | Admitting: Women's Health

## 2020-06-25 ENCOUNTER — Ambulatory Visit (HOSPITAL_COMMUNITY)
Admission: RE | Admit: 2020-06-25 | Discharge: 2020-06-25 | Disposition: A | Discharge status: Home / Self Care | Payer: Medicaid Other | Source: Ambulatory Visit | Attending: Family Medicine | Admitting: Family Medicine

## 2020-06-25 ENCOUNTER — Other Ambulatory Visit: Payer: Self-pay

## 2020-06-25 DIAGNOSIS — J019 Acute sinusitis, unspecified: Secondary | ICD-10-CM | POA: Diagnosis not present

## 2020-06-25 DIAGNOSIS — R519 Headache, unspecified: Secondary | ICD-10-CM | POA: Diagnosis not present

## 2020-06-25 DIAGNOSIS — G4452 New daily persistent headache (NDPH): Secondary | ICD-10-CM | POA: Diagnosis present

## 2020-06-29 NOTE — Addendum Note (Signed)
Addended by: Dairl Ponder on: 06/29/2020 11:10 AM   Modules accepted: Orders

## 2020-07-08 ENCOUNTER — Encounter: Payer: Self-pay | Admitting: Family Medicine

## 2020-07-09 ENCOUNTER — Encounter: Payer: Self-pay | Admitting: Family Medicine

## 2020-07-14 ENCOUNTER — Ambulatory Visit (INDEPENDENT_AMBULATORY_CARE_PROVIDER_SITE_OTHER): Payer: Medicaid Other | Admitting: Clinical

## 2020-07-14 DIAGNOSIS — F331 Major depressive disorder, recurrent, moderate: Secondary | ICD-10-CM

## 2020-07-14 DIAGNOSIS — F53 Postpartum depression: Secondary | ICD-10-CM

## 2020-07-19 DIAGNOSIS — F331 Major depressive disorder, recurrent, moderate: Secondary | ICD-10-CM | POA: Insufficient documentation

## 2020-07-19 NOTE — Progress Notes (Signed)
Comprehensive Clinical Assessment (CCA) Note  07/14/2020 Jody Hansen [id number]   Virtual Visit via Video Note  I connected with Jody Hansen on 07/14/2020 at  1:00 PM EDT by a video enabled telemedicine application and verified that I am speaking with the correct person using two identifiers.  Location: Patient: work Provider: office   I discussed the limitations of evaluation and management by telemedicine and the availability of in person appointments. The patient expressed understanding and agreed to proceed.   Follow Up Instructions: I discussed the assessment and treatment plan with the patient. The patient was provided an opportunity to ask questions and all were answered. The patient agreed with the plan and demonstrated an understanding of the instructions.   The patient was advised to call back or seek an in-person evaluation if the symptoms worsen or if the condition fails to improve as anticipated.  I provided  35 minutes of non-face-to-face time during this encounter.   Bernestine Amass, LCSW   Chief Complaint:  Chief Complaint  Patient presents with  . Depression  . Anxiety   Visit Diagnosis:   Major depressive disorder, recurrent episode, moderate w/ anxious distress  Interpretive Summary:   Client is a 25 year old female presenting to [facility] for outpatient behavioral health services. Client presents by referral of her OBGYN for a clinical assessment. Client presents with a history of postpartum depression, and mood swings. Client reported for years she has struggled with "emotions change very quickly". Client reported her range of emotions have negatively impacted her relationship as her fianc has made comments such as "I think something is wrong with you". Client reported she gets upset, irritated and her fianc' has reported they argue a lot because of how she acts. Client reported following birth she was diagnosed with postpartum by her OBGYN who is  currently treating her symptoms with Celexa. Client endorses variation of being happy, sad, and angry within a 20 to 30 min time frame daily, argumentative, irritability, overthinking, change in appetite, sleeping too much, and loss of interests in doing things once enjoyed. Client reported no history of prior inpatient and/ or outpatient services for mental health reasons. Client denied substance use history. Client presented oriented times five, appropriately dressed, and friendly. Client denied hallucinations and delusions. Client was screened for Pain, Nutrition, [place] Suicide Severity and the following SDOH:  GAD 7 : Generalized Anxiety Score 07/14/2020 01/02/2020 12/14/2018  Nervous, Anxious, on Edge 2 3 0  Control/stop worrying 3 3 1   Worry too much - different things 3 3 2   Trouble relaxing 3 3 1   Restless 3 1 0  Easily annoyed or irritable 3 3 3   Afraid - awful might happen 3 3 0  Total GAD 7 Score 20 19 7   Anxiety Difficulty Extremely difficult - Medical laboratory scientific officer Row Counselor from 07/14/2020 in [place]  PHQ-9 Total Score 19       Treatment recommendations: Psychiatric evaluation. Client declined individual therapy at this time.  Therapist provided information on format of appointment (virtual or face to face).  The client was advised to call back or seek an in-person evaluation if the symptoms worsen or if the condition fails to improve as anticipated before the next scheduled appointment. Client was in agreement with treatment recommendations.    CCA Biopsychosocial Intake/Chief Complaint:  Client reported she is referred for outpatient services due to ongoing mood swings that have impacted her occupational, social, and interpersonal functioning. Client reported her  mood swings have been occurring for years.  Current Symptoms/Problems: Client reported mood swings, irritability, depressed mood, over sleeping, racing thoughts, over thinking, and  decreased appetite   Patient Reported Schizophrenia/Schizoaffective Diagnosis in Past: No   Strengths: No data recorded Preferences: No data recorded Abilities: No data recorded  Type of Services Patient Feels are Needed: psychiatric evaluation   Initial Clinical Notes/Concerns: No data recorded  Mental Health Symptoms Depression:  Change in energy/activity; Irritability; Sleep (too much or little)   Duration of Depressive symptoms: Greater than two weeks   Mania:  None   Anxiety:   Irritability; Tension   Psychosis:  None   Duration of Psychotic symptoms: No data recorded  Trauma:  None   Obsessions:  None   Compulsions:  None   Inattention:  None   Hyperactivity/Impulsivity:  N/A   Oppositional/Defiant Behaviors:  None   Emotional Irregularity:  None   Other Mood/Personality Symptoms:  No data recorded   Mental Status Exam Appearance and self-care  Stature:  Average   Weight:  Average weight   Clothing:  Casual   Grooming:  Normal   Cosmetic use:  Age appropriate   Posture/gait:  Normal   Motor activity:  Not Remarkable   Sensorium  Attention:  Normal   Concentration:  Normal   Orientation:  X5   Recall/memory:  Normal   Affect and Mood  Affect:  Congruent   Mood:  Euthymic   Relating  Eye contact:  Normal   Facial expression:  Responsive   Attitude toward examiner:  Cooperative   Thought and Language  Speech flow: Clear and Coherent   Thought content:  Appropriate to Mood and Circumstances   Preoccupation:  None   Hallucinations:  None   Organization:  No data recorded  [employer] of Knowledge:  Good   Intelligence:  Average   Abstraction:  Normal   Judgement:  Good   Reality Testing:  Adequate   Insight:  Good   Decision Making:  Normal   Social Functioning  Social Maturity:  Responsible   Social Judgement:  Normal   Stress  Stressors:  Family conflict   Coping Ability:  Resilient    Skill Deficits:  Activities of daily living; Communication; Interpersonal   Supports:  Family     Religion: Religion/Spirituality Are You A Religious Person?: No  Leisure/Recreation: Leisure / Recreation Do You Have Hobbies?: Yes  Exercise/Diet: Exercise/Diet Do You Exercise?: No Have You Gained or Lost A Significant Amount of Weight in the Past Six Months?: No Do You Follow a Special Diet?: No Do You Have Any Trouble Sleeping?: Yes   CCA Employment/Education Employment/Work Situation: Employment / Work Situation Employment situation: Employed 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 How long has patient been employed?: 9 months Patient's job has been impacted by current illness: Yes Describe how patient's job has been impacted: calling out a lot  Education: Education Did Teacher, adult education From [employer]?: Yes   CCA Family/Childhood History Family and Relationship History: Family history Marital status: Long term relationship Long term relationship, how long?: 7 years, now engaged Does patient have children?: Yes How many children?: 2  Childhood History:  Childhood History By whom was/is the patient raised?: Mother Additional childhood history information: Client reported she was raised by her mother. Client reported during her childhood they were poor; her dad was in and out of jail a lot he was not around. Client reported not having a close relationship with her  mother. Client reported her father passed away last year. Does patient have siblings?: Yes Did patient suffer any verbal/emotional/physical/sexual abuse as a child?: No Did patient suffer from severe childhood neglect?: No Has patient ever been sexually abused/assaulted/raped as an adolescent or adult?: No Was the patient ever a victim of a crime or a disaster?: No Witnessed domestic violence?: No Has patient been affected by domestic violence as an adult?: No  Child/Adolescent  Assessment:     CCA Substance Use Alcohol/Drug Use: Alcohol / Drug Use History of alcohol / drug use?: No history of alcohol / drug abuse                         ASAM's:  Six Dimensions of Multidimensional Assessment  Dimension 1:  Acute Intoxication and/or Withdrawal Potential:      Dimension 2:  Biomedical Conditions and Complications:      Dimension 3:  Emotional, Behavioral, or Cognitive Conditions and Complications:     Dimension 4:  Readiness to Change:     Dimension 5:  Relapse, Continued use, or Continued Problem Potential:     Dimension 6:  Recovery/Living Environment:     ASAM Severity Score:    ASAM Recommended Level of Treatment:     Substance use Disorder (SUD)    Recommendations for Services/Supports/Treatments: Recommendations for Services/Supports/Treatments Recommendations For Services/Supports/Treatments: Medication Management  DSM5 Diagnoses: Patient Active Problem List   Diagnosis Date Noted  . New daily persistent headache 06/01/2020  . Right lower quadrant abdominal pain 04/02/2020  . Sore throat 11/08/2019  . History of shoulder dystocia in prior pregnancy 09/10/2019  . Postpartum depression 08/28/2019  . Migraines with aura 06/02/2016  . Ganglion cyst 08/10/2015  . Renal colic on right side 03/15/3233    Patient Centered Plan: Patient is on the following Treatment Plan(s):  Depression   Referrals to Alternative Service(s): Referred to Alternative Service(s):   Place:   Date:   Time:    Referred to Alternative Service(s):   Place:   Date:   Time:    Referred to Alternative Service(s):   Place:   Date:   Time:    Referred to Alternative Service(s):   Place:   Date:   Time:     Bernestine Amass, LCSW

## 2020-07-24 ENCOUNTER — Encounter: Payer: Self-pay | Admitting: Family Medicine

## 2020-07-29 ENCOUNTER — Encounter: Payer: Self-pay | Admitting: Emergency Medicine

## 2020-07-29 ENCOUNTER — Ambulatory Visit
Admission: EM | Admit: 2020-07-29 | Discharge: 2020-07-29 | Disposition: A | Discharge status: Home / Self Care | Payer: Medicaid Other | Attending: Family Medicine | Admitting: Family Medicine

## 2020-07-29 ENCOUNTER — Other Ambulatory Visit: Payer: Self-pay

## 2020-07-29 DIAGNOSIS — J069 Acute upper respiratory infection, unspecified: Secondary | ICD-10-CM | POA: Diagnosis not present

## 2020-07-29 NOTE — Discharge Instructions (Addendum)
Your COVID and Influenza tests are pending.  You should self quarantine until the test results are back.    Take Tylenol or ibuprofen as needed for fever or discomfort.  Rest and keep yourself hydrated.    Follow-up with your primary care provider if your symptoms are not improving.

## 2020-07-29 NOTE — ED Provider Notes (Signed)
[facility]    CSN: [id number] Arrival date & time: 07/29/20  1102      History   Chief Complaint No chief complaint on file.   HPI Jody Hansen is a 25 y.o. female.   Reports cough, runny nose, sneezing, sore throat for the last 2 days.  Reports that both of her kids are also sick.  Has had positive history of COVID.  Has not completed COVID vaccines.  Has not completed flu vaccine.  Has been taking OTC cough and cold with no relief.  Denies abdominal pain, chills, muscle aches, nausea, vomiting, diarrhea, rash, fever, other symptoms.  ROS per HPI   The history is provided by the patient.    Past Medical History:  Diagnosis Date  . Kidney stones 2017  . Pregnant 01/23/2015  . Scoliosis     Patient Active Problem List   Diagnosis Date Noted  . Major depressive disorder, recurrent episode, moderate with anxious distress ([place]) 07/19/2020  . New daily persistent headache 06/01/2020  . Right lower quadrant abdominal pain 04/02/2020  . Sore throat 11/08/2019  . History of shoulder dystocia in prior pregnancy 09/10/2019  . Postpartum depression 08/28/2019  . Migraines with aura 06/02/2016  . Ganglion cyst 08/10/2015  . Renal colic on right side 54/27/0623    Past Surgical History:  Procedure Laterality Date  . NO PAST SURGERIES      OB History    Gravida  3   Para  2   Term  2   Preterm      AB  1   Living  2     SAB  1   IAB      Ectopic      Multiple  0   Live Births  2            Home Medications    Prior to Admission medications   Medication Sig Start Date End Date Taking? Authorizing Provider  citalopram (CELEXA) 20 MG tablet Take 10mg  (1/2 tablet) daily x 1 week, then 20mg  (1 tablet) daily thereafter 04/28/20   Roma Schanz, CNM  norethindrone (MICRONOR) 0.35 MG tablet Take 1 tablet (0.35 mg total) by mouth daily. 06/15/20   Roma Schanz, CNM    Family History Family History  Problem Relation Age of  Onset  . Diabetes Paternal Grandfather   . Diabetes Paternal Grandmother   . Cancer Maternal Grandmother   . Cancer Maternal Grandfather   . Diabetes Father   . Diabetes Mother   . Diabetes Sister        borderline  . Lung disease Paternal Aunt     Social History Social History   Tobacco Use  . Smoking status: Never Smoker  . Smokeless tobacco: Never Used  Vaping Use  . Vaping Use: Never used  Substance Use Topics  . Alcohol use: No  . Drug use: No     Allergies   Depo-provera [medroxyprogesterone acetate]   Review of Systems Review of Systems   Physical Exam Triage Vital Signs ED Triage Vitals [07/29/20 1114]  Enc Vitals Group     BP 117/79     Pulse Rate 89     Resp 18     Temp 98.6 F (37 C)     Temp Source Oral     SpO2 98 %     Weight      Height      Head Circumference      Peak  Flow      Pain Score 0     Pain Loc      Pain Edu?      Excl. in [place]?    No data found.  Updated Vital Signs BP 117/79 (BP Location: Right Arm)   Pulse 89   Temp 98.6 F (37 C) (Oral)   Resp 18   LMP 07/24/2020   SpO2 98%   Visual Acuity Right Eye Distance:   Left Eye Distance:   Bilateral Distance:    Right Eye Near:   Left Eye Near:    Bilateral Near:     Physical Exam Vitals and nursing note reviewed.  Constitutional:      General: She is not in acute distress.    Appearance: Normal appearance. She is well-developed and normal weight. She is not ill-appearing.  HENT:     Head: Normocephalic and atraumatic.     Right Ear: Tympanic membrane, ear canal and external ear normal.     Left Ear: Tympanic membrane and ear canal normal.     Nose: Congestion present.     Mouth/Throat:     Mouth: Mucous membranes are moist.     Pharynx: Oropharynx is clear.  Eyes:     Extraocular Movements: Extraocular movements intact.     Conjunctiva/sclera: Conjunctivae normal.     Pupils: Pupils are equal, round, and reactive to light.  Cardiovascular:     Rate and  Rhythm: Normal rate and regular rhythm.     Heart sounds: Normal heart sounds. No murmur heard.   Pulmonary:     Effort: Pulmonary effort is normal. No respiratory distress.     Breath sounds: Normal breath sounds. No stridor. No wheezing, rhonchi or rales.  Chest:     Chest wall: No tenderness.  Abdominal:     General: Bowel sounds are normal. There is no distension.     Palpations: Abdomen is soft. There is no mass.     Tenderness: There is no abdominal tenderness. There is no right CVA tenderness, left CVA tenderness, guarding or rebound.     Hernia: No hernia is present.  Musculoskeletal:        General: Normal range of motion.     Cervical back: Normal range of motion and neck supple.  Lymphadenopathy:     Cervical: Cervical adenopathy present.  Skin:    General: Skin is warm and dry.     Capillary Refill: Capillary refill takes less than 2 seconds.  Neurological:     General: No focal deficit present.     Mental Status: She is alert and oriented to person, place, and time.  Psychiatric:        Mood and Affect: Mood normal.        Behavior: Behavior normal.        Thought Content: Thought content normal.      UC Treatments / Results  Labs (all labs ordered are listed, but only abnormal results are displayed) Labs Reviewed  COVID-19, FLU A+B NAA    EKG   Radiology No results found.  Procedures Procedures (including critical care time)  Medications Ordered in UC Medications - No data to display  Initial Impression / Assessment and Plan / UC Course  I have reviewed the triage vital signs and the nursing notes.  Pertinent labs & imaging results that were available during my care of the patient were reviewed by me and considered in my medical decision making (see chart for details).  Viral URI with cough  May continue with over-the-counter treatments Push fluids and get plenty of rest Covid and flu swab obtained in office today.   Patient instructed to  quarantine until results are back and negative.   If results are negative, patient may resume daily schedule as tolerated once they are fever free for 24 hours without the use of antipyretic medications.   If results are positive, patient instructed to quarantine for at least 5 days from symptom onset.  If after 5 days symptoms have resolved, may return to work with a well fitting mask for the next 5 days. If symptomatic after day 5, isolation should be extended to 10 days. Patient instructed to follow-up with primary care or with this office as needed.   Patient instructed to follow-up in the ER for trouble swallowing, trouble breathing, other concerning symptoms.   Final Clinical Impressions(s) / UC Diagnoses   Final diagnoses:  Viral URI with cough     Discharge Instructions     Your COVID and Influenza tests are pending.  You should self quarantine until the test results are back.    Take Tylenol or ibuprofen as needed for fever or discomfort.  Rest and keep yourself hydrated.    Follow-up with your primary care provider if your symptoms are not improving.        ED Prescriptions    None     PDMP not reviewed this encounter.   Faustino Congress, NP 07/29/20 1910

## 2020-07-29 NOTE — ED Triage Notes (Signed)
Cough, runny nose, sneezing x 2 days, sore throat

## 2020-07-30 LAB — COVID-19, FLU A+B NAA
Influenza A, NAA: NOT DETECTED
Influenza B, NAA: NOT DETECTED
SARS-CoV-2, NAA: NOT DETECTED

## 2020-07-31 ENCOUNTER — Encounter: Payer: Self-pay | Admitting: Family Medicine

## 2020-08-04 NOTE — Telephone Encounter (Signed)
Nurses Please connect with patient.  Have the front go ahead and do a note for her for any days that she is asking for.  If you need to verbally run this by me please do but otherwise please go ahead and cover for the patient thank you

## 2020-08-05 ENCOUNTER — Encounter: Payer: Self-pay | Admitting: Family Medicine

## 2020-08-05 NOTE — Telephone Encounter (Signed)
It could take several more days to get over this  May have a work note through Sunday with return to work on Monday  May use OTC decongestant cough medications if any ongoing troubles or problems notify us

## 2020-08-06 ENCOUNTER — Encounter: Payer: Self-pay | Admitting: Family Medicine

## 2020-08-07 ENCOUNTER — Encounter (HOSPITAL_COMMUNITY): Payer: Self-pay | Admitting: Psychiatry

## 2020-08-07 ENCOUNTER — Other Ambulatory Visit: Payer: Self-pay | Admitting: Nurse Practitioner

## 2020-08-07 ENCOUNTER — Ambulatory Visit (INDEPENDENT_AMBULATORY_CARE_PROVIDER_SITE_OTHER): Payer: Medicaid Other | Admitting: Psychiatry

## 2020-08-07 DIAGNOSIS — F411 Generalized anxiety disorder: Secondary | ICD-10-CM

## 2020-08-07 DIAGNOSIS — F332 Major depressive disorder, recurrent severe without psychotic features: Secondary | ICD-10-CM | POA: Diagnosis not present

## 2020-08-07 MED ORDER — CITALOPRAM HYDROBROMIDE 40 MG PO TABS: ORAL_TABLET | ORAL | 2 refills | Status: DC

## 2020-08-07 MED ORDER — HYDROXYZINE HCL 10 MG PO TABS: 10.0000 mg | ORAL_TABLET | Freq: Three times a day (TID) | ORAL | 2 refills | Status: DC | PRN

## 2020-08-07 MED ORDER — AZITHROMYCIN 250 MG PO TABS: ORAL_TABLET | ORAL | 0 refills | Status: DC

## 2020-08-07 NOTE — Progress Notes (Signed)
Psychiatric Initial Adult Assessment  Virtual Visit via Video Note  I connected with Jody Hansen on 08/07/20 at  9:00 AM EDT by a video enabled telemedicine application and verified that I am speaking with the correct person using two identifiers.  Location: Patient: Home Provider: Clinic   I discussed the limitations of evaluation and management by telemedicine and the availability of in person appointments. The patient expressed understanding and agreed to proceed.  I provided 45 minutes of non-face-to-face time during this encounter.    Patient Identification: Jody Hansen MRN:  [id number] Date of Evaluation:  08/07/2020 Referral Source: Sallee Lange, MD Chief Complaint:  "Im not been feeling well" Visit Diagnosis:    ICD-10-CM   1. Severe episode of recurrent major depressive disorder, without psychotic features ([place])  F33.2 citalopram (CELEXA) 40 MG tablet  2. Generalized anxiety disorder  F41.1 citalopram (CELEXA) 40 MG tablet    hydrOXYzine (ATARAX/VISTARIL) 10 MG tablet    History of Present Illness:  25 year old female seen today for initial psychiatric evaluation. She was referred to outpatient psychiatry by her PCP for medication management. 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 She is currently managed on Celexa 20 mg. She notes her medications are somewhat effective in managing her psychiatric conditions.  Today she is well groomed, pleasant, cooperate, engaged in conversation, and maintained eye contact. Her affect was flat and her speech was slow. She note that she has not been feeling her best and reports she feels down most days. Today provider conducted a GAD 7 and patient scored a 21. She notes that she worries about everything. Provider also conducted a PHQ 9 and she scored a 23. She notes that she sleeps 8-12 hours nightly. She notes that her mood fluctuates daily, has increased irritability, distractibility, and racing  thoughts. At times she notes that she is paranoid. She notes that she avoids leaving her home because she believes people will judge her or that they will do something to harm her. She notes that she has her groceries delivered to avoid going out. Today she denies SI/HI/VAH.  Patient notes that she is tired most days.She informed Probation officer that she works at [employer] and notes that it is stressful. Patient notes she wants to improve her mental health for herself and her two children who are one and 26 years old. She notes that she plans to attend her 7 year olds graduation today.  Today she is agreeable to increase Celexa 20 mg to 40 mg to help manage anxiety and depression. She is also agreeable to staring hydroxyzine 10 mg three times daily to help manage anxiety. If paranoia continues after medication adjustments Abilify or another antipsychotic potentially may be added. Potential side effects of medication and risks vs benefits of treatment vs non-treatment were explained and discussed. All questions were answered.She will follow up with outpatient counseling for therapy. No other concerns noted at this time.    Associated Signs/Symptoms: Depression Symptoms:  depressed mood, anhedonia, hypersomnia, psychomotor agitation, psychomotor retardation, feelings of worthlessness/guilt, difficulty concentrating, hopelessness, anxiety, panic attacks, loss of energy/fatigue, weight gain, increased appetite, (Hypo) Manic Symptoms:  Distractibility, Elevated Mood, Irritable Mood, Anxiety Symptoms:  Excessive Worry, Psychotic Symptoms:  Paranoia, PTSD Symptoms: NA  Past Psychiatric History: Depression Previous Psychotropic Medications: Celexa  Substance Abuse History in the last 12 months:  No.  Consequences of Substance Abuse: NA  Past Medical History:  Past Medical History:  Diagnosis Date  .  Kidney stones 2017  . Pregnant 01/23/2015  . Scoliosis     Past Surgical  History:  Procedure Laterality Date  . NO PAST SURGERIES      Family Psychiatric History: Denies  Family History:  Family History  Problem Relation Age of Onset  . Diabetes Paternal Grandfather   . Diabetes Paternal Grandmother   . Cancer Maternal Grandmother   . Cancer Maternal Grandfather   . Diabetes Father   . Diabetes Mother   . Diabetes Sister        borderline  . Lung disease Paternal Aunt     Social History:   Social History   Socioeconomic History  . Marital status: Significant Other    Spouse name: Not on file  . Number of children: 1  . Years of education: Not on file  . Highest education level: High school graduate  Occupational History  . Occupation: call center  Tobacco Use  . Smoking status: Never Smoker  . Smokeless tobacco: Never Used  Vaping Use  . Vaping Use: Never used  Substance and Sexual Activity  . Alcohol use: No  . Drug use: No  . Sexual activity: Yes    Birth control/protection: Pill  Other Topics Concern  . Not on file  Social History Narrative  . Not on file   Social Determinants of Health   Financial Resource Strain: Not on file  Food Insecurity: Not on file  Transportation Needs: Not on file  Physical Activity: Not on file  Stress: Not on file  Social Connections: Not on file    Additional Social History: Patient resides in [place] with her fiances and two children (1 and 4). She works at [employer]. She denies tobacco, alcohol, and illegal drug use.  Allergies:   Allergies  Allergen Reactions  . Depo-Provera [Medroxyprogesterone Acetate] Other (See Comments)    Weight gain; bruising; headaches    Metabolic Disorder Labs: No results found for: HGBA1C, MPG No results found for: PROLACTIN No results found for: CHOL, TRIG, HDL, CHOLHDL, VLDL, LDLCALC Lab Results  Component Value Date   TSH 2.070 03/18/2019    Therapeutic Level Labs: No results found for: LITHIUM No results found for: CBMZ No  results found for: VALPROATE  Current Medications: Current Outpatient Medications  Medication Sig Dispense Refill  . hydrOXYzine (ATARAX/VISTARIL) 10 MG tablet Take 1 tablet (10 mg total) by mouth 3 (three) times daily as needed. 90 tablet 2  . citalopram (CELEXA) 40 MG tablet Take 40 mg daily 30 tablet 2  . norethindrone (MICRONOR) 0.35 MG tablet Take 1 tablet (0.35 mg total) by mouth daily. 28 tablet 11   No current facility-administered medications for this visit.    Musculoskeletal: Strength & Muscle Tone: Unable to assess due to telehealth visit Mier: Unable to assess due to telehalth visit Patient leans: N/A  Psychiatric Specialty Exam: Review of Systems  Last menstrual period 07/24/2020, not currently breastfeeding.There is no height or weight on file to calculate BMI.  General Appearance: Well Groomed  Eye Contact:  Good  Speech:  Clear and Coherent and Slow  Volume:  Decreased  Mood:  Anxious and Depressed  Affect:  Appropriate and Congruent  Thought Process:  Coherent, Goal Directed and Linear  Orientation:  Full (Time, Place, and Person)  Thought Content:  Logical and Paranoid Ideation  Suicidal Thoughts:  No  Homicidal Thoughts:  No  Memory:  Immediate;   Good Recent;   Good Remote;   Good  Judgement:  Good  Insight:  Good  Psychomotor Activity:  Normal  Concentration:  Concentration: Good and Attention Span: Good  Recall:  Good  Fund of Knowledge:Good  Language: Good  Akathisia:  No  Handed:  Right  AIMS (if indicated):  Not done  Assets:  Communication Skills Desire for Improvement Financial Resources/Insurance Housing Intimacy Physical Health Social Support Vocational/Educational  ADL's:  Intact  Cognition: WNL  Sleep:  Fair   Screenings: GAD-7   Personnel officer Visit from 08/07/2020 in [facility] Counselor from 07/14/2020 in [place] from  01/02/2020 in [facility] [place] [facility] Office Visit from 12/14/2018 in [place]  Total GAD-7 Score 21 20 19 7     PHQ2-9   [place] Office Visit from 08/07/2020 in [facility] Counselor from 07/14/2020 in [facility] Video Visit from 05/25/2020 in [facility] Video Visit from 04/28/2020 in [place] from 01/02/2020 in [place] [facility] [place] [facility]  PHQ-2 Total Score 5 6 0 6 6  PHQ-9 Total Score 23 19 1 24 22     [place] Office Visit from 08/07/2020 in [facility] from 07/29/2020 in [facility] [place] from 07/14/2020 in [place] Error: Question 6 not populated No Risk No Risk      Assessment and Plan: Patient endorse symptoms of anxietym depression, and paranoia. Today she is agreeable to increase Celexa 20 mg to 40 mg to help manage anxiety and depression. She is also agreeable to staring hydroxyzine 10 mg three times daily to help manage anxiety. If paranoia continues after medication adjustments Abilify or another antipsychotic potentially may be added.   1. Generalized anxiety disorder  Increased- citalopram (CELEXA) 40 MG tablet; Take 40 mg daily  Dispense: 30 tablet; Refill: 2 Start- hydrOXYzine (ATARAX/VISTARIL) 10 MG tablet; Take 1 tablet (10 mg total) by mouth 3 (three) times daily as needed.  Dispense: 90 tablet; Refill: 2  2. Severe episode of recurrent major depressive disorder, without psychotic features ([place])  Increased- citalopram (CELEXA) 40 MG tablet; Take 40 mg daily  Dispense: 30 tablet; Refill: 2  Follow up in 3 months Follow up with therapy  Salley Slaughter, NP 6/3/20229:25 AM

## 2020-08-09 ENCOUNTER — Encounter: Payer: Self-pay | Admitting: Family Medicine

## 2020-08-11 ENCOUNTER — Encounter: Payer: Self-pay | Admitting: Family Medicine

## 2020-08-11 MED ORDER — MUPIROCIN 2 % EX OINT: TOPICAL_OINTMENT | CUTANEOUS | 0 refills | Status: DC

## 2020-08-31 DIAGNOSIS — S335XXA Sprain of ligaments of lumbar spine, initial encounter: Secondary | ICD-10-CM | POA: Diagnosis not present

## 2020-09-16 ENCOUNTER — Ambulatory Visit: Payer: Medicaid Other | Admitting: Family Medicine

## 2020-10-19 ENCOUNTER — Encounter: Payer: Self-pay | Admitting: Family Medicine

## 2020-10-19 ENCOUNTER — Other Ambulatory Visit: Payer: Self-pay

## 2020-10-19 ENCOUNTER — Ambulatory Visit: Payer: Medicaid Other | Admitting: Family Medicine

## 2020-10-19 VITALS — BP 134/86 | Ht 60.0 in | Wt 200.0 lb

## 2020-10-19 DIAGNOSIS — R319 Hematuria, unspecified: Secondary | ICD-10-CM | POA: Diagnosis not present

## 2020-10-19 DIAGNOSIS — M533 Sacrococcygeal disorders, not elsewhere classified: Secondary | ICD-10-CM | POA: Diagnosis not present

## 2020-10-19 DIAGNOSIS — D2122 Benign neoplasm of connective and other soft tissue of left lower limb, including hip: Secondary | ICD-10-CM

## 2020-10-19 LAB — POCT URINALYSIS DIPSTICK
Spec Grav, UA: 1.02 (ref 1.010–1.025)
pH, UA: 6 (ref 5.0–8.0)

## 2020-10-19 NOTE — Progress Notes (Signed)
   Subjective:    Patient ID: Jody Hansen, female    DOB: Jul 21, 1995, 25 y.o.   MRN: [id number]  HPI Patient arrives with bump on side of left leg since son was born. Patient now getting an new bump on other leg.  These bumps seem to start off with small skin lesions then turned into a big thick bump  Patient also has a brown spot on up back concerning her and low back pain and cramps in her buttocks daily.  She relates pain in the lower back on the right side it radiates into the leg intermittently present over the past several months.  Works at the [employer] does a lot of lifting  Hematuria- went on for 2 days off and on for 2 days around July has not hads since has not had any true kidney stone pain she states this occurred around the time of her cycle but she did not think it was vaginal bleeding  Lower back pain began around the same time around the 15th of July, tender to the touch  Possibly a fibroma left leg and right leg  Mole on her back   Review of Systems     Objective:   Physical Exam  General-in no acute distress Eyes-no discharge Lungs-respiratory rate normal, CTA CV-no murmurs,RRR Extremities skin warm dry no edema Neuro grossly normal Behavior normal, alert Negative straight leg raise Fibroma on the left lower leg small 1 on the right lower leg Mole on her upper back Tenderness in her lumbar spine on the right side negative straight leg raise Urinalysis no blood seen, microscopic negative     Assessment & Plan:  1. Fibroma of left lower leg May do the compresses as needed if they become sore otherwise see dermatology possible intralesional steroid injection?  Possible biopsy - Ambulatory referral to Dermatology  2. Sacro-iliac pain Stretching exercises recommended.  Orthopedic surgery will see her in more than likely do physical therapy - Ambulatory referral to Orthopedic Surgery  3. Hematuria, unspecified type No findings of bleeding on  today's exam if this reoccurs will need urology work-up - POCT urinalysis dipstick  Follow-up as needed

## 2020-10-23 ENCOUNTER — Ambulatory Visit (INDEPENDENT_AMBULATORY_CARE_PROVIDER_SITE_OTHER): Payer: Medicaid Other

## 2020-10-23 ENCOUNTER — Telehealth: Payer: Self-pay | Admitting: Radiology

## 2020-10-23 ENCOUNTER — Encounter: Payer: Self-pay | Admitting: Family

## 2020-10-23 ENCOUNTER — Ambulatory Visit: Payer: Medicaid Other | Admitting: Family

## 2020-10-23 ENCOUNTER — Other Ambulatory Visit: Payer: Self-pay

## 2020-10-23 DIAGNOSIS — M545 Low back pain, unspecified: Secondary | ICD-10-CM

## 2020-10-23 DIAGNOSIS — M461 Sacroiliitis, not elsewhere classified: Secondary | ICD-10-CM

## 2020-10-23 MED ORDER — PREDNISONE 10 MG PO TABS: ORAL_TABLET | ORAL | 0 refills | Status: DC

## 2020-10-23 NOTE — Patient Instructions (Signed)
Sacroiliac Joint Dysfunction °Sacroiliac joint dysfunction is a condition that causes inflammation on one or both sides of the sacroiliac (SI) joint. The SI joint is the joint between two bones of the pelvis called the sacrum and the ilium. The sacrum is the bone at the base of the spine. The ilium is the large bone that forms the hip. This condition causes deep aching or burning pain in the low back. In some cases, the pain may also spread into one or both buttocks, hips, or thighs. °What are the causes? °This condition may be caused by: °Pregnancy. During pregnancy, extra stress is put on the SI joints because the pelvis widens. °Injury, such as: °Injuries from car crashes. °Sports-related injuries. °Work-related injuries. °Having one leg that is shorter than the other. °Conditions that affect the joints, such as: °Rheumatoid arthritis. °Gout. °Psoriatic arthritis. °Joint infection (septic arthritis). °Sometimes, the cause of SI joint dysfunction is not known. °What are the signs or symptoms? °Symptoms of this condition include: °Aching or burning pain in the lower back. The pain may also spread to other areas, such as: °Buttocks. °Groin. °Thighs. °Muscle spasms in or around the painful areas. °Increased pain when standing, walking, running, stair climbing, bending, or lifting. °How is this diagnosed? °This condition is diagnosed with a physical exam and your medical history. During the exam, the health care provider may move one or both of your legs to different positions to check for pain. Various tests may be done to confirm the diagnosis, including: °Imaging tests to look for other causes of pain. These may include: °MRI. °CT scan. °Bone scan. °Diagnostic injection. A numbing medicine is injected into the SI joint using a needle. If your pain is temporarily improved or stopped after the injection, this can indicate that SI joint dysfunction is the problem. °How is this treated? °Treatment depends on the cause  and severity of your condition. Treatment options can be noninvasive and may include: °Ice or heat applied to the lower back area after an injury. This may help reduce pain and muscle spasms. °Medicines to relieve pain or inflammation or to relax the muscles. °Wearing a back brace (sacroiliac brace) to help support the joint while your back is healing. °Physical therapy to increase muscle strength around the joint and flexibility at the joint. This may also involve learning proper body positions and ways of moving to relieve stress on the joint. °Direct manipulation of the SI joint. °Use of a device that provides electrical stimulation to help reduce pain at the joint. °Other treatments may include: °Injections of steroid medicine into the joint to reduce pain and swelling. °Radiofrequency ablation. This treatment uses heat to burn away nerves that are carrying pain messages from the joint. °Surgery to put in screws and plates that limit or prevent joint motion. This is rare. °Follow these instructions at home: °Medicines °Take over-the-counter and prescription medicines only as told by your health care provider. °Ask your health care provider if the medicine prescribed to you: °Requires you to avoid driving or using machinery. °Can cause constipation. You may need to take these actions to prevent or treat constipation: °Drink enough fluid to keep your urine pale yellow. °Take over-the-counter or prescription medicines. °Eat foods that are high in fiber, such as beans, whole grains, and fresh fruits and vegetables. °Limit foods that are high in fat and processed sugars, such as fried or sweet foods. °If you have a brace: °Wear the brace as told by your health care provider. Remove   it only as told by your health care provider. °Keep the brace clean. °If the brace is not waterproof: °Do not let it get wet. °Cover it with a watertight covering when you take a bath or a shower. °Managing pain, stiffness, and swelling °   °Icing can help with pain and swelling. Heat may help with muscle tension or spasms. Ask your health care provider if you should use ice or heat. °If directed, put ice on the affected area: °If you have a removable brace, remove it as told by your health care provider. °Put ice in a plastic bag. °Place a towel between your skin and the bag. °Leave the ice on for 20 minutes, 2-3 times a day. °Remove the ice if your skin turns bright red. This is very important. If you cannot feel pain, heat, or cold, you have a greater risk of damage to the area. °If directed, apply heat to the affected area as often as told by your health care provider. Use the heat source that your health care provider recommends, such as a moist heat pack or a heating pad. °Place a towel between your skin and the heat source. °Leave the heat on for 20-30 minutes. °Remove the heat if your skin turns bright red. This is especially important if you are unable to feel pain, heat, or cold. You may have a greater risk of getting burned. °General instructions °Rest as needed. Return to your normal activities as told by your health care provider. Ask your health care provider what activities are safe for you. °Do exercises as told by your health care provider or physical therapist. °Keep all follow-up visits. This is important. °Contact a health care provider if: °Your pain is not controlled with medicine. °You have a fever. °Your pain is getting worse. °Get help right away if: °You have weakness, numbness, or tingling in your legs or feet. °You lose control of your bladder or bowels. °Summary °Sacroiliac (SI) joint dysfunction is a condition that causes inflammation on one or both sides of the SI joint. °This condition causes deep aching or burning pain in the low back. In some cases, the pain may also spread into one or both buttocks, hips, or thighs. °Treatment depends on the cause and severity of your condition. It may include medicines to reduce  pain and swelling or to relax muscles. °This information is not intended to replace advice given to you by your health care provider. Make sure you discuss any questions you have with your health care provider. °Document Revised: 07/04/2019 Document Reviewed: 07/04/2019 °Elsevier Patient Education © 2022 [employer] Inc. ° °

## 2020-10-23 NOTE — Progress Notes (Addendum)
Office Visit Note   Patient: Jody Hansen           Date of Birth: Dec 14, 1995           MRN: [id number] Visit Date: 10/23/2020              Requested by: Kathyrn Drown, MD [place],  [place] [address] PCP: Kathyrn Drown, MD  No chief complaint on file.     HPI: The patient is a 25 year old female seen today for evaluation of right-sided low back pain.  She does not have any injury she can recall however she was driving to the beach and near the end of her drive began having right-sided pain that was radiating from her low back into her buttocks this is burning and shooting.  This does not extend into her hip groin or leg.  She does not have any associated numbness is painful to press on her low back  She has been seen by primary care for the same  Assessment & Plan: Visit Diagnoses:  1. Sacroiliitis, not elsewhere classified ([place])   2. Low back pain, unspecified back pain laterality, unspecified chronicity, unspecified whether sciatica present     Plan: We will trial a prednisone course.  Hope that this will provide her with some relief we will follow-up with her in 3 to 4 weeks if no change  Follow-Up Instructions: Return in about 4 weeks (around 11/20/2020).   Right Hip Exam   Tenderness  The patient is experiencing no tenderness.    Back Exam   Tenderness  The patient is experiencing tenderness in the sacroiliac.  Range of Motion  The patient has normal back ROM.  Muscle Strength  The patient has normal back strength.  Tests  Straight leg raise right: negative Straight leg raise left: negative     Patient is alert, oriented, no adenopathy, well-dressed, normal affect, normal respiratory effort.   Imaging: No results found. No images are attached to the encounter.  Labs: Lab Results  Component Value Date   REPTSTATUS 01/27/2019 FINAL 01/25/2019   CULT (A) 01/25/2019    <10,000 COLONIES/mL INSIGNIFICANT  GROWTH Performed at [facility] [address] [place],  [address]    LABORGA ESCHERICHIA COLI 05/29/2015   LABORGA ENTEROCOCCUS SPECIES 05/29/2015     Lab Results  Component Value Date   ALBUMIN 3.9 04/02/2020   ALBUMIN 3.3 (L) 05/30/2015    No results found for: MG No results found for: VD25OH  No results found for: PREALBUMIN CBC EXTENDED Latest Ref Rng & Units 04/02/2020 08/06/2019 08/05/2019  WBC 4.0 - 10.5 K/uL 6.6 12.6(H) 12.5(H)  RBC 3.87 - 5.11 MIL/uL 4.72 3.30(L) 3.88  HGB 12.0 - 15.0 g/dL 14.6 10.1(L) 11.8(L)  HCT 36.0 - 46.0 % 43.2 30.6(L) 35.5(L)  PLT 150 - 400 K/uL 296 173 218  NEUTROABS 1.4 - 7.0 x10E3/uL - - -  LYMPHSABS 0.7 - 3.1 x10E3/uL - - -     There is no height or weight on file to calculate BMI.  Orders:  Orders Placed This Encounter  Procedures   XR Lumbar Spine 2-3 Views   XR Sacrum/Coccyx   Meds ordered this encounter  Medications   predniSONE (DELTASONE) 10 MG tablet    Sig: 6 tablets for 2 days, then 5 for 2 days, then 4 for 2 days, then 3  for 2 days, then 2 for 2 days, then 1 tablet for 2 days  Dispense:  42 tablet    Refill:  0     Procedures: No procedures performed  Clinical Data: No additional findings.  ROS:  All other systems negative, except as noted in the HPI. Review of Systems  Objective: Vital Signs: There were no vitals taken for this visit.  Specialty Comments:  No specialty comments available.  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   Migraines with aura 06/02/2016   Ganglion cyst [id number]   Renal colic on right side [id number]   Past Medical History:  Diagnosis Date   Kidney stones 2017   Pregnant 01/23/2015   Scoliosis     Family History  Problem Relation Age of Onset   Diabetes Paternal Grandfather    Diabetes Paternal Grandmother    Cancer Maternal Grandmother    Cancer Maternal Grandfather    Diabetes Father    Diabetes Mother    Diabetes Sister        borderline   Lung disease Paternal Aunt     Past Surgical History:  Procedure Laterality Date   NO PAST SURGERIES     Social History   Occupational History   Occupation: call center  Tobacco Use   Smoking status: Never   Smokeless tobacco: Never  Vaping Use   Vaping Use: Never used  Substance and Sexual Activity   Alcohol use: No   Drug use: No   Sexual activity: Yes    Birth control/protection: Pill

## 2020-10-23 NOTE — Telephone Encounter (Signed)
Patient stated LMP was 10/03/20 and negative pregnancy test on the morning of 10/23/20.

## 2020-10-26 ENCOUNTER — Encounter: Payer: Self-pay | Admitting: Family Medicine

## 2020-11-04 ENCOUNTER — Telehealth (INDEPENDENT_AMBULATORY_CARE_PROVIDER_SITE_OTHER): Payer: Medicaid Other | Admitting: Psychiatry

## 2020-11-04 ENCOUNTER — Other Ambulatory Visit: Payer: Self-pay

## 2020-11-04 ENCOUNTER — Encounter (HOSPITAL_COMMUNITY): Payer: Self-pay | Admitting: Psychiatry

## 2020-11-04 DIAGNOSIS — F411 Generalized anxiety disorder: Secondary | ICD-10-CM

## 2020-11-04 DIAGNOSIS — F332 Major depressive disorder, recurrent severe without psychotic features: Secondary | ICD-10-CM

## 2020-11-04 MED ORDER — HYDROXYZINE HCL 10 MG PO TABS: 10.0000 mg | ORAL_TABLET | Freq: Three times a day (TID) | ORAL | 3 refills | Status: DC | PRN

## 2020-11-04 MED ORDER — CITALOPRAM HYDROBROMIDE 40 MG PO TABS
ORAL_TABLET | ORAL | 3 refills | Status: DC
Start: 2020-11-04 — End: 2021-02-10

## 2020-11-04 NOTE — Progress Notes (Signed)
[place] MD/PA/NP OP Progress Note Virtual Visit via Telephone Note  I connected with Jody Hansen on 11/04/20 at 10:30 AM EDT by telephone and verified that I am speaking with the correct person using two identifiers.  Location: Patient: home Provider: Clinic   I discussed the limitations, risks, security and privacy concerns of performing an evaluation and management service by telephone and the availability of in person appointments. I also discussed with the patient that there may be a patient responsible charge related to this service. The patient expressed understanding and agreed to proceed.   I provided 30 minutes of non-face-to-face time during this encounter.  11/04/2020 10:51 AM Jody Hansen  MRN:  [id number]  Chief Complaint: "Im stressed a lot because I work a lot"  HPI: 25 year old female seen today for follow up psychiatric evaluation.  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 She is currently managed on Celexa 40 mg and Hydroxyzine 10 mg three times. She notes her medications are somewhat effective in managing her psychiatric conditions.  Today she was unable to logon virtually so assessment was done over the phone.  During exam she was pleasant, cooperate, and engaged in conversation.  She informed Probation officer that since her last visit she has been stressed because she works a lot.  She notes that she continues to work at [employer].  She notes that when she comes home she has to care for her 1-year-old and her 35-year-old.  She informed Probation officer that recently her 39-year-old has been waking her up at 4 AM and she has been having to comfort him.  She does note that she gets approximately 8 hours of sleep at night.  She informed Probation officer that her appetite has increased but notes that she has maintained her weight.    Patient informed Probation officer that she has been having back pain.  She notes that she has missed her period and believes that she may be  pregnant.  She informed Probation officer that she follows up with her PCP in a week to determine if she is truly pregnant.  Patient notes that her depression and anxiety has somewhat improved since her last visit.  Today provider conducted a GAD 7 and patient scored a 19, at her last visit she scored a 21. Provider also conducted a PHQ 9 and she scored a 15, at her last visit she scored 23. Today she denies SI/HI/VAH, paranoia, or mania.  Patient also notes that a source of her stress is returning to school.  She notes that she now goes to [employer] and is studying childhood development.  No medication changes made today.  Provider discussed risk and benefits of staying on medications if patient is pregnant.  She informed Probation officer that she will call back when she determines if she is pregnant and potentially want to reduce or discontinue medication.  For now she will continue medications as prescribed and follow-up with outpatient counseling for therapy.  No other concerns noted at this time.    Visit Diagnosis:    ICD-10-CM   1. Generalized anxiety disorder  F41.1 citalopram (CELEXA) 40 MG tablet    hydrOXYzine (ATARAX/VISTARIL) 10 MG tablet    2. Severe episode of recurrent major depressive disorder, without psychotic features ([place])  F33.2 citalopram (CELEXA) 40 MG tablet      Past Psychiatric History: anxiety and postpartum depression  Past Medical History:  Past Medical History:  Diagnosis Date   Kidney stones  2017   Pregnant 01/23/2015   Scoliosis     Past Surgical History:  Procedure Laterality Date   NO PAST SURGERIES      Family Psychiatric History: Denies  Family History:  Family History  Problem Relation Age of Onset   Diabetes Paternal Grandfather    Diabetes Paternal Grandmother    Cancer Maternal Grandmother    Cancer Maternal Grandfather    Diabetes Father    Diabetes Mother    Diabetes Sister        borderline   Lung disease Paternal Aunt      Social History:  Social History   Socioeconomic History   Marital status: Significant Other    Spouse name: Not on file   Number of children: 1   Years of education: Not on file   Highest education level: High school graduate  Occupational History   Occupation: call center  Tobacco Use   Smoking status: Never   Smokeless tobacco: Never  Vaping Use   Vaping Use: Never used  Substance and Sexual Activity   Alcohol use: No   Drug use: No   Sexual activity: Yes    Birth control/protection: Pill  Other Topics Concern   Not on file  Social History Narrative   Not on file   Social Determinants of Health   Financial Resource Strain: Not on file  Food Insecurity: Not on file  Transportation Needs: Not on file  Physical Activity: Not on file  Stress: Not on file  Social Connections: Not on file    Allergies:  Allergies  Allergen Reactions   Depo-Provera [Medroxyprogesterone Acetate] Other (See Comments)    Weight gain; bruising; headaches    Metabolic Disorder Labs: No results found for: HGBA1C, MPG No results found for: PROLACTIN No results found for: CHOL, TRIG, HDL, CHOLHDL, VLDL, LDLCALC Lab Results  Component Value Date   TSH 2.070 03/18/2019   TSH 2.410 04/03/2018    Therapeutic Level Labs: No results found for: LITHIUM No results found for: VALPROATE No components found for:  CBMZ  Current Medications: Current Outpatient Medications  Medication Sig Dispense Refill   citalopram (CELEXA) 40 MG tablet Take 40 mg daily 30 tablet 3   hydrOXYzine (ATARAX/VISTARIL) 10 MG tablet Take 1 tablet (10 mg total) by mouth 3 (three) times daily as needed. 90 tablet 3   mupirocin ointment (BACTROBAN) 2 % Apply BID per day topically to inside of navel for 7 days 30 g 0   norethindrone (MICRONOR) 0.35 MG tablet Take 1 tablet (0.35 mg total) by mouth daily. 28 tablet 11   predniSONE (DELTASONE) 10 MG tablet 6 tablets for 2 days, then 5 for 2 days, then 4 for 2 days,  then 3  for 2 days, then 2 for 2 days, then 1 tablet for 2 days 42 tablet 0   No current facility-administered medications for this visit.     Musculoskeletal: Strength & Muscle Tone:  Unable to assess due to telephone visit  Gait & Station:   Unable to assess due to telephone visit  Patient leans: N/A  Psychiatric Specialty Exam: Review of Systems  not currently breastfeeding.There is no height or weight on file to calculate BMI.  General Appearance: Well Groomed  Eye Contact:  Good  Speech:  Clear and Coherent and Normal Rate  Volume:  Normal  Mood:  Anxious and Depressed  Affect:  Appropriate and Congruent  Thought Process:  Coherent, Goal Directed, and Linear  Orientation:  Full (Time, Place,  and Person)  Thought Content: WDL and Logical   Suicidal Thoughts:  No  Homicidal Thoughts:  No  Memory:  Immediate;   Good Recent;   Good Remote;   Good  Judgement:  Good  Insight:  Good  Psychomotor Activity:    Unable to assess due to telephone visit   Concentration:  Concentration: Good and Attention Span: Good  Recall:  Good  Fund of Knowledge: Good  Language: Good  Akathisia:  No  Handed:  Right  AIMS (if indicated): not done  Assets:  Communication Skills Desire for Improvement Financial Resources/Insurance Housing Intimacy Leisure Time Social Support  ADL's:  Intact  Cognition: WNL  Sleep:  Good   Screenings: GAD-7    Flowsheet Row Video Visit from 11/04/2020 in [facility] Office Visit from 08/07/2020 in [facility] Counselor from 07/14/2020 in [place] from [facility]/02/[facility] Office Visit from 12/14/2018 in [place]  Total GAD-7 Score '19 21 20 19 7      '$ PHQ2-9    Flowsheet Row Video Visit from 11/04/2020 in [facility] Office Visit from  10/19/2020 in [place] Visit from 08/07/2020 in [facility] Counselor from 07/14/2020 in [facility] Video Visit from 05/25/2020 in [facility]  PHQ-2 Total Score 4 0 5 6 0  PHQ-9 Total Score 15 -- '23 19 1      '$ [place] Office Visit from 08/07/2020 in [facility] ED from 07/29/2020 in [facility] [place] from 07/14/2020 in [place] Error: Question 6 not populated No Risk No Risk        Assessment and Plan: Patient notes her anxiety and depression has somewhat improved since her last visit.  She informed Probation officer she is stressed due to work, school and the potential of being pregnant. No medication changes made today.  Provider discussed risk and benefits of staying on medications if patient is pregnant.  She informed Probation officer that she will call back when she determines if she is pregnant and potentially want to reduce or discontinue medication.  For now she will continue medications as prescribed  1. Generalized anxiety disorder  Continue- citalopram (CELEXA) 40 MG tablet; Take 40 mg daily  Dispense: 30 tablet; Refill: 3 Continue- hydrOXYzine (ATARAX/VISTARIL) 10 MG tablet; Take 1 tablet (10 mg total) by mouth 3 (three) times daily as needed.  Dispense: 90 tablet; Refill: 3  2. Severe episode of recurrent major depressive disorder, without psychotic features ([place])  Continue- citalopram (CELEXA) 40 MG tablet; Take 40 mg daily  Dispense: 30 tablet; Refill: 3  Follow-up in 3 months Follow-up with therapy  Salley Slaughter, NP 11/04/2020, 10:51 AM

## 2020-11-08 DIAGNOSIS — R030 Elevated blood-pressure reading, without diagnosis of hypertension: Secondary | ICD-10-CM | POA: Diagnosis not present

## 2020-11-08 DIAGNOSIS — N923 Ovulation bleeding: Secondary | ICD-10-CM | POA: Diagnosis not present

## 2020-11-08 DIAGNOSIS — Z6838 Body mass index (BMI) 38.0-38.9, adult: Secondary | ICD-10-CM | POA: Diagnosis not present

## 2020-11-08 DIAGNOSIS — N911 Secondary amenorrhea: Secondary | ICD-10-CM | POA: Diagnosis not present

## 2020-11-08 DIAGNOSIS — Z3009 Encounter for other general counseling and advice on contraception: Secondary | ICD-10-CM | POA: Diagnosis not present

## 2020-11-11 ENCOUNTER — Other Ambulatory Visit: Payer: Self-pay | Admitting: Family

## 2020-11-11 DIAGNOSIS — M5440 Lumbago with sciatica, unspecified side: Secondary | ICD-10-CM

## 2020-11-11 NOTE — Progress Notes (Signed)
Spoke with patient. Ongoing low back pain with radiculopathy. No improvement with prednisone.

## 2020-11-25 DIAGNOSIS — Z1159 Encounter for screening for other viral diseases: Secondary | ICD-10-CM | POA: Diagnosis not present

## 2020-11-25 DIAGNOSIS — E8881 Metabolic syndrome: Secondary | ICD-10-CM | POA: Diagnosis not present

## 2020-11-25 DIAGNOSIS — E7800 Pure hypercholesterolemia, unspecified: Secondary | ICD-10-CM | POA: Diagnosis not present

## 2020-11-25 DIAGNOSIS — R635 Abnormal weight gain: Secondary | ICD-10-CM | POA: Diagnosis not present

## 2020-11-25 DIAGNOSIS — N914 Secondary oligomenorrhea: Secondary | ICD-10-CM | POA: Diagnosis not present

## 2020-11-25 DIAGNOSIS — Z79899 Other long term (current) drug therapy: Secondary | ICD-10-CM | POA: Diagnosis not present

## 2020-11-25 DIAGNOSIS — R5383 Other fatigue: Secondary | ICD-10-CM | POA: Diagnosis not present

## 2020-11-25 DIAGNOSIS — R0602 Shortness of breath: Secondary | ICD-10-CM | POA: Diagnosis not present

## 2020-11-25 DIAGNOSIS — E669 Obesity, unspecified: Secondary | ICD-10-CM | POA: Diagnosis not present

## 2020-11-25 DIAGNOSIS — D539 Nutritional anemia, unspecified: Secondary | ICD-10-CM | POA: Diagnosis not present

## 2020-11-25 DIAGNOSIS — E559 Vitamin D deficiency, unspecified: Secondary | ICD-10-CM | POA: Diagnosis not present

## 2020-11-25 DIAGNOSIS — Z131 Encounter for screening for diabetes mellitus: Secondary | ICD-10-CM | POA: Diagnosis not present

## 2020-11-26 ENCOUNTER — Other Ambulatory Visit: Payer: Medicaid Other

## 2020-12-09 DIAGNOSIS — D2372 Other benign neoplasm of skin of left lower limb, including hip: Secondary | ICD-10-CM | POA: Diagnosis not present

## 2020-12-09 NOTE — Addendum Note (Signed)
Addended by: Dondra Prader R on: 12/09/2020 03:06 PM   Modules accepted: Orders

## 2020-12-17 ENCOUNTER — Ambulatory Visit: Payer: Medicaid Other | Admitting: Physical Medicine and Rehabilitation

## 2020-12-31 DIAGNOSIS — E559 Vitamin D deficiency, unspecified: Secondary | ICD-10-CM | POA: Diagnosis not present

## 2020-12-31 DIAGNOSIS — E669 Obesity, unspecified: Secondary | ICD-10-CM | POA: Diagnosis not present

## 2020-12-31 DIAGNOSIS — N914 Secondary oligomenorrhea: Secondary | ICD-10-CM | POA: Diagnosis not present

## 2020-12-31 DIAGNOSIS — E8881 Metabolic syndrome: Secondary | ICD-10-CM | POA: Diagnosis not present

## 2020-12-31 DIAGNOSIS — R635 Abnormal weight gain: Secondary | ICD-10-CM | POA: Diagnosis not present

## 2021-02-10 ENCOUNTER — Encounter (HOSPITAL_COMMUNITY): Payer: Self-pay | Admitting: Psychiatry

## 2021-02-10 ENCOUNTER — Telehealth (INDEPENDENT_AMBULATORY_CARE_PROVIDER_SITE_OTHER): Payer: Medicaid Other | Admitting: Psychiatry

## 2021-02-10 DIAGNOSIS — F332 Major depressive disorder, recurrent severe without psychotic features: Secondary | ICD-10-CM | POA: Diagnosis not present

## 2021-02-10 DIAGNOSIS — F411 Generalized anxiety disorder: Secondary | ICD-10-CM

## 2021-02-10 MED ORDER — CITALOPRAM HYDROBROMIDE 40 MG PO TABS: ORAL_TABLET | ORAL | 3 refills | Status: DC

## 2021-02-10 MED ORDER — HYDROXYZINE HCL 10 MG PO TABS
10.0000 mg | ORAL_TABLET | Freq: Three times a day (TID) | ORAL | 3 refills | Status: DC | PRN
Start: 2021-02-10 — End: 2021-03-04

## 2021-02-10 NOTE — Progress Notes (Signed)
[place] MD/PA/NP OP Progress Note Virtual Visit via Telephone Note  I connected with Jody Hansen on 02/10/21 at  2:00 PM EST by telephone and verified that I am speaking with the correct person using two identifiers.  Location: Patient: home Provider: Clinic   I discussed the limitations, risks, security and privacy concerns of performing an evaluation and management service by telephone and the availability of in person appointments. I also discussed with the patient that there may be a patient responsible charge related to this service. The patient expressed understanding and agreed to proceed.   I provided 30 minutes of non-face-to-face time during this encounter.  02/10/2021 2:20 PM Jody Hansen  MRN:  [id number]  Chief Complaint: "Im fine"  HPI: 25 year old female seen today for follow up psychiatric evaluation.  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 She is currently managed on Celexa 40 mg and Hydroxyzine 10 mg three times. She notes her medications are effective in managing her psychiatric conditions.  Today she was unable to logon virtually so assessment was done over the phone.  During exam she was pleasant, cooperate, and engaged in conversation.  She informed Probation officer that she is doing fine.  Patient notes that at times she becomes anxious about her work and her children but notes that she is able to cope with it.  Provider conducted a GAD-7 and patient scored a 9, at her last visit she scored a 19.  Provider also conducted a PHQ-9 and patient scored a 2, at last visit she scored a 15.  She notes that recently she was placed on medications to suppress her appetite and help her lose weight.  She informed Probation officer that she lost 5 pounds since her last visit.  She notes however that she discontinued these medications because of she felt that they were causing irregular periods.  She notes that she follows up with her doctor in a few days and will discuss  future options.   Patient notes that since her last visit she was diagnosed with sciatica.  She informed Probation officer that she continues to be in pain most days however notes that she is able to cope with it.   No medication changes made today.  Patient agreeable to continue medication as prescribed.  She will follow-up with outpatient counseling for therapy.  No other concerns     Visit Diagnosis:    ICD-10-CM   1. Generalized anxiety disorder  F41.1 citalopram (CELEXA) 40 MG tablet    hydrOXYzine (ATARAX) 10 MG tablet    2. Severe episode of recurrent major depressive disorder, without psychotic features ([place])  F33.2 citalopram (CELEXA) 40 MG tablet      Past Psychiatric History: anxiety and postpartum depression  Past Medical History:  Past Medical History:  Diagnosis Date   Kidney stones 2017   Pregnant 01/23/2015   Scoliosis     Past Surgical History:  Procedure Laterality Date   NO PAST SURGERIES      Family Psychiatric History: Denies  Family History:  Family History  Problem Relation Age of Onset   Diabetes Paternal Grandfather    Diabetes Paternal Grandmother    Cancer Maternal Grandmother    Cancer Maternal Grandfather    Diabetes Father    Diabetes Mother    Diabetes Sister        borderline   Lung disease Paternal Aunt     Social History:  Social History   Socioeconomic History   Marital  status: Significant Other    Spouse name: Not on file   Number of children: 1   Years of education: Not on file   Highest education level: High school graduate  Occupational History   Occupation: call center  Tobacco Use   Smoking status: Never   Smokeless tobacco: Never  Vaping Use   Vaping Use: Never used  Substance and Sexual Activity   Alcohol use: No   Drug use: No   Sexual activity: Yes    Birth control/protection: Pill  Other Topics Concern   Not on file  Social History Narrative   Not on file   Social Determinants of Health   Financial Resource  Strain: Not on file  Food Insecurity: Not on file  Transportation Needs: Not on file  Physical Activity: Not on file  Stress: Not on file  Social Connections: Not on file    Allergies:  Allergies  Allergen Reactions   Depo-Provera [Medroxyprogesterone Acetate] Other (See Comments)    Weight gain; bruising; headaches    Metabolic Disorder Labs: No results found for: HGBA1C, MPG No results found for: PROLACTIN No results found for: CHOL, TRIG, HDL, CHOLHDL, VLDL, LDLCALC Lab Results  Component Value Date   TSH 2.070 03/18/2019   TSH 2.410 04/03/2018    Therapeutic Level Labs: No results found for: LITHIUM No results found for: VALPROATE No components found for:  CBMZ  Current Medications: Current Outpatient Medications  Medication Sig Dispense Refill   citalopram (CELEXA) 40 MG tablet Take 40 mg daily 30 tablet 3   hydrOXYzine (ATARAX) 10 MG tablet Take 1 tablet (10 mg total) by mouth 3 (three) times daily as needed. 90 tablet 3   mupirocin ointment (BACTROBAN) 2 % Apply BID per day topically to inside of navel for 7 days 30 g 0   norethindrone (MICRONOR) 0.35 MG tablet Take 1 tablet (0.35 mg total) by mouth daily. 28 tablet 11   predniSONE (DELTASONE) 10 MG tablet 6 tablets for 2 days, then 5 for 2 days, then 4 for 2 days, then 3  for 2 days, then 2 for 2 days, then 1 tablet for 2 days 42 tablet 0   No current facility-administered medications for this visit.     Musculoskeletal: Strength & Muscle Tone:  Unable to assess due to telephone visit  Gait & Station:   Unable to assess due to telephone visit  Patient leans: N/A  Psychiatric Specialty Exam: Review of Systems  not currently breastfeeding.There is no height or weight on file to calculate BMI.  General Appearance: Well Groomed  Eye Contact:  Good  Speech:  Clear and Coherent and Normal Rate  Volume:  Normal  Mood:  Euthymic  Affect:  Appropriate and Congruent  Thought Process:  Coherent, Goal Directed,  and Linear  Orientation:  Full (Time, Place, and Person)  Thought Content: WDL and Logical   Suicidal Thoughts:  No  Homicidal Thoughts:  No  Memory:  Immediate;   Good Recent;   Good Remote;   Good  Judgement:  Good  Insight:  Good  Psychomotor Activity:    Unable to assess due to telephone visit   Concentration:  Concentration: Good and Attention Span: Good  Recall:  Good  Fund of Knowledge: Good  Language: Good  Akathisia:  No  Handed:  Right  AIMS (if indicated): not done  Assets:  Communication Skills Desire for Improvement Financial Resources/Insurance Housing Intimacy Leisure Time Social Support  ADL's:  Intact  Cognition: WNL  Sleep:  Good   Screenings: GAD-7    Flowsheet Row Video Visit from 02/10/2021 in [facility] Video Visit from 11/04/2020 in [facility] Office Visit from 08/07/2020 in [facility] Counselor from 07/14/2020 in [place] from 01/02/2020 in [place] [facility] [place] [facility]  Total GAD-7 Score 9 19 21 20 19       PHQ2-9    Flowsheet Row Video Visit from 02/10/2021 in [facility] Video Visit from 11/04/2020 in [facility] Office Visit from 10/19/2020 in [place] Office Visit from 08/07/2020 in [facility] Counselor from 07/14/2020 in [facility]  PHQ-2 Total Score 0 4 0 5 6  PHQ-9 Total Score 2 15 -- 23 [place] Office Visit from 08/07/2020 in [facility] ED from 07/29/2020 in [facility] [place] from 07/14/2020 in [place] Error: Question 6 not populated No Risk No Risk        Assessment and Plan: Patient notes that she is  doing well on her current medication regimen.  No medication changes made today.  Patient agreeable to continue medications as prescribed.  1. Generalized anxiety disorder  Continue- citalopram (CELEXA) 40 MG tablet; Take 40 mg daily  Dispense: 30 tablet; Refill: 3 Continue- hydrOXYzine (ATARAX/VISTARIL) 10 MG tablet; Take 1 tablet (10 mg total) by mouth 3 (three) times daily as needed.  Dispense: 90 tablet; Refill: 3  2. Severe episode of recurrent major depressive disorder, without psychotic features ([place])  Continue- citalopram (CELEXA) 40 MG tablet; Take 40 mg daily  Dispense: 30 tablet; Refill: 3  Follow-up in 3 months Follow-up with therapy  Salley Slaughter, NP 02/10/2021, 2:20 PM

## 2021-02-28 ENCOUNTER — Encounter: Payer: Self-pay | Admitting: Family Medicine

## 2021-02-28 ENCOUNTER — Encounter: Payer: Self-pay | Admitting: Emergency Medicine

## 2021-02-28 ENCOUNTER — Telehealth: Payer: Medicaid Other

## 2021-03-04 ENCOUNTER — Other Ambulatory Visit: Payer: Self-pay

## 2021-03-04 ENCOUNTER — Ambulatory Visit (HOSPITAL_COMMUNITY)
Admission: EM | Admit: 2021-03-04 | Discharge: 2021-03-04 | Disposition: A | Discharge status: Home / Self Care | Payer: Medicaid Other | Attending: Internal Medicine | Admitting: Internal Medicine

## 2021-03-04 ENCOUNTER — Encounter (HOSPITAL_COMMUNITY): Payer: Self-pay

## 2021-03-04 DIAGNOSIS — U071 COVID-19: Secondary | ICD-10-CM | POA: Diagnosis not present

## 2021-03-04 DIAGNOSIS — J069 Acute upper respiratory infection, unspecified: Secondary | ICD-10-CM

## 2021-03-04 DIAGNOSIS — R051 Acute cough: Secondary | ICD-10-CM | POA: Insufficient documentation

## 2021-03-04 DIAGNOSIS — Z28310 Unvaccinated for covid-19: Secondary | ICD-10-CM | POA: Diagnosis not present

## 2021-03-04 MED ORDER — IBUPROFEN 600 MG PO TABS: 600.0000 mg | ORAL_TABLET | Freq: Three times a day (TID) | ORAL | 0 refills | Status: DC | PRN

## 2021-03-04 NOTE — ED Triage Notes (Signed)
Pt c/o cough, runny nose, chills, and headaches since yesterday.

## 2021-03-04 NOTE — ED Provider Notes (Signed)
[place]    CSN: [id number] Arrival date & time: 03/04/21  1410      History   Chief Complaint Chief Complaint  Patient presents with   Cough    HPI Jody Hansen is a 25 y.o. female was to the urgent care with 1 day history of runny nose, nonproductive cough, chills, subjective fever and headaches yesterday.  Patient is not vaccinated against COVID-19.  Other family members have had similar symptoms prior to onset of her symptoms.  She denies any shortness of breath or wheezing.   HPI  Past Medical History:  Diagnosis Date   Kidney stones 2017   Pregnant 01/23/2015   Scoliosis     Patient Active Problem List   Diagnosis Date Noted   Generalized anxiety disorder 08/07/2020   Severe episode of recurrent major depressive disorder, without psychotic features ([place]) 08/07/2020   Major depressive disorder, recurrent episode, moderate with anxious distress ([place]) 07/19/2020   New daily persistent headache 06/01/2020   Right lower quadrant abdominal pain 04/02/2020   Sore throat 11/08/2019   History of shoulder dystocia in prior pregnancy 09/10/2019   Postpartum depression 08/28/2019   Migraines with aura 06/02/2016   Ganglion cyst 35/32/9924   Renal colic on right side 26/83/4196    Past Surgical History:  Procedure Laterality Date   NO PAST SURGERIES      OB History     Gravida  3   Para  2   Term  2   Preterm      AB  1   Living  2      SAB  1   IAB      Ectopic      Multiple  0   Live Births  2            Home Medications    Prior to Admission medications   Medication Sig Start Date End Date Taking? Authorizing Provider  ibuprofen (ADVIL) 600 MG tablet Take 1 tablet (600 mg total) by mouth every 8 (eight) hours as needed. 03/04/21  Yes Mica Releford, Myrene Galas, MD    Family History Family History  Problem Relation Age of Onset   Diabetes Paternal Grandfather    Diabetes Paternal Grandmother    Cancer Maternal  Grandmother    Cancer Maternal Grandfather    Diabetes Father    Diabetes Mother    Diabetes Sister        borderline   Lung disease Paternal Aunt     Social History Social History   Tobacco Use   Smoking status: Never   Smokeless tobacco: Never  Vaping Use   Vaping Use: Never used  Substance Use Topics   Alcohol use: No   Drug use: No     Allergies   Depo-provera [medroxyprogesterone acetate]   Review of Systems Review of Systems  Constitutional:  Positive for chills. Negative for fatigue and fever.  HENT:  Positive for congestion, rhinorrhea and sore throat.   Eyes: Negative.   Respiratory:  Positive for cough.   Cardiovascular: Negative.   Gastrointestinal: Negative.   Musculoskeletal:  Positive for arthralgias and myalgias.    Physical Exam Triage Vital Signs ED Triage Vitals  Enc Vitals Group     BP 03/04/21 1542 115/71     Pulse Rate 03/04/21 1542 85     Resp 03/04/21 1542 18     Temp 03/04/21 1542 99 F (37.2 C)     Temp Source 03/04/21 1542 Oral  SpO2 03/04/21 1542 97 %     Weight --      Height --      Head Circumference --      Peak Flow --      Pain Score 03/04/21 1543 0     Pain Loc --      Pain Edu? --      Excl. in [place]? --    No data found.  Updated Vital Signs BP 115/71 (BP Location: Left Arm)    Pulse 85    Temp 99 F (37.2 C) (Oral)    Resp 18    LMP 03/01/2021    SpO2 97%    Breastfeeding No   Visual Acuity Right Eye Distance:   Left Eye Distance:   Bilateral Distance:    Right Eye Near:   Left Eye Near:    Bilateral Near:     Physical Exam Vitals and nursing note reviewed.  Constitutional:      General: She is not in acute distress.    Appearance: She is not ill-appearing.  HENT:     Right Ear: Tympanic membrane normal.     Left Ear: Tympanic membrane normal.     Mouth/Throat:     Mouth: Mucous membranes are moist.     Pharynx: Posterior oropharyngeal erythema present.  Cardiovascular:     Rate and Rhythm:  Normal rate and regular rhythm.  Pulmonary:     Effort: Pulmonary effort is normal. No respiratory distress.     Breath sounds: Normal breath sounds. No wheezing.  Abdominal:     General: Bowel sounds are normal.     Palpations: Abdomen is soft.  Musculoskeletal:        General: Normal range of motion.  Neurological:     Mental Status: She is alert.     UC Treatments / Results  Labs (all labs ordered are listed, but only abnormal results are displayed) Labs Reviewed  SARS CORONAVIRUS 2 (TAT 6-24 HRS)    EKG   Radiology No results found.  Procedures Procedures (including critical care time)  Medications Ordered in UC Medications - No data to display  Initial Impression / Assessment and Plan / UC Course  I have reviewed the triage vital signs and the nursing notes.  Pertinent labs & imaging results that were available during my care of the patient were reviewed by me and considered in my medical decision making (see chart for details).     1.  Viral upper respiratory infection: COVID-19 PCR test has been sent Ibuprofen as needed for pain and/or fever Increase oral fluid intake We will call patient with recommendations if labs are abnormal. Final Clinical Impressions(s) / UC Diagnoses   Final diagnoses:  Acute upper respiratory infection     Discharge Instructions      Medications as prescribed Increase oral fluid intake Return to urgent care if symptoms worsen We will call you with recommendations if labs are abnormal.   ED Prescriptions     Medication Sig Dispense Auth. Provider   ibuprofen (ADVIL) 600 MG tablet Take 1 tablet (600 mg total) by mouth every 8 (eight) hours as needed. 30 tablet Jaylen Knope, Myrene Galas, MD      PDMP not reviewed this encounter.   Chase Picket, MD 03/04/21 [phone]

## 2021-03-04 NOTE — Discharge Instructions (Signed)
Medications as prescribed Increase oral fluid intake Return to urgent care if symptoms worsen We will call you with recommendations if labs are abnormal.

## 2021-03-05 ENCOUNTER — Ambulatory Visit (HOSPITAL_COMMUNITY): Payer: Medicaid Other

## 2021-03-05 ENCOUNTER — Encounter: Payer: Self-pay | Admitting: Family Medicine

## 2021-03-05 LAB — SARS CORONAVIRUS 2 (TAT 6-24 HRS): SARS Coronavirus 2: POSITIVE — AB

## 2021-03-05 NOTE — Telephone Encounter (Signed)
With positive COVID must stay away from others for at least 5 days.  After 5 days when around others needs to wear a mask.  That would be for at least 5 additional days.  If warning signs occur such as shortness of breath difficulty breathing passing out recommend 911 or ER.   If she needs a work note please clarify the dates then the front can issue her a work note.

## 2021-03-09 ENCOUNTER — Encounter: Payer: Self-pay | Admitting: Family Medicine

## 2021-03-09 NOTE — Telephone Encounter (Signed)
Front-please give her a work note for the stated days due to [place] thank you

## 2021-04-12 ENCOUNTER — Encounter: Payer: Self-pay | Admitting: Women's Health

## 2021-04-13 ENCOUNTER — Ambulatory Visit: Payer: Medicaid Other | Admitting: Adult Health

## 2021-04-13 ENCOUNTER — Encounter: Payer: Self-pay | Admitting: Adult Health

## 2021-04-13 ENCOUNTER — Other Ambulatory Visit (HOSPITAL_COMMUNITY)
Admission: RE | Admit: 2021-04-13 | Discharge: 2021-04-13 | Disposition: A | Discharge status: Home / Self Care | Payer: Medicaid Other | Source: Ambulatory Visit | Attending: Adult Health | Admitting: Adult Health

## 2021-04-13 ENCOUNTER — Other Ambulatory Visit: Payer: Self-pay

## 2021-04-13 VITALS — BP 129/78 | HR 76 | Ht 62.0 in | Wt 199.0 lb

## 2021-04-13 DIAGNOSIS — R1031 Right lower quadrant pain: Secondary | ICD-10-CM

## 2021-04-13 DIAGNOSIS — Z7251 High risk heterosexual behavior: Secondary | ICD-10-CM | POA: Diagnosis not present

## 2021-04-13 DIAGNOSIS — Z124 Encounter for screening for malignant neoplasm of cervix: Secondary | ICD-10-CM | POA: Diagnosis not present

## 2021-04-13 DIAGNOSIS — Z3202 Encounter for pregnancy test, result negative: Secondary | ICD-10-CM | POA: Diagnosis not present

## 2021-04-13 DIAGNOSIS — N940 Mittelschmerz: Secondary | ICD-10-CM | POA: Insufficient documentation

## 2021-04-13 DIAGNOSIS — Z113 Encounter for screening for infections with a predominantly sexual mode of transmission: Secondary | ICD-10-CM | POA: Diagnosis not present

## 2021-04-13 LAB — POCT URINE PREGNANCY: Preg Test, Ur: NEGATIVE

## 2021-04-13 MED ORDER — LEVONORGESTREL 1.5 MG PO TABS: 1.5000 mg | ORAL_TABLET | Freq: Once | ORAL | 0 refills | Status: AC

## 2021-04-13 NOTE — Progress Notes (Signed)
°  Subjective:     Patient ID: MARYJEAN CORPENING, female   DOB: Feb 18, 1996, 26 y.o.   MRN: [id number]  HPI Darielle is a 26 year old white female, with SO, [id number] in complaining of pain RLQ about 3 days ago and had sex about 2 days ago, that was unprotected, she does not want to be pregnant. PCP is [facility].  Lab Results  Component Value Date   DIAGPAP  06/13/2017    NEGATIVE FOR INTRAEPITHELIAL LESIONS OR MALIGNANCY.   DIAGPAP  06/13/2017    FUNGAL ORGANISMS PRESENT CONSISTENT WITH CANDIDA SPP.    Review of Systems +RLQ pain, better now Reviewed past medical,surgical, social and family history. Reviewed medications and allergies.     Objective:   Physical Exam BP 129/78 (BP Location: Right Arm, Patient Position: Sitting, Cuff Size: Normal)    Pulse 76    Ht 5\' 2"  (1.575 m)    Wt 199 lb (90.3 kg)    LMP 03/28/2021 (Exact Date)    Breastfeeding No    BMI 36.40 kg/m  UPT is negative  Skin warm and dry.Pelvic: external genitalia is normal in appearance no lesions, vagina: white discharge without odor,urethra has no lesions or masses noted, cervix:smooth and bulbous, pap with GC/CHL and HR HPV genotyping performed, uterus: normal size, shape and contour, non tender, no masses felt, adnexa: no masses or tenderness noted. Bladder is non tender and no masses felt.     Upstream - 04/13/21 1059       Pregnancy Intention Screening   Does the patient want to become pregnant in the next year? No    Does the patient's partner want to become pregnant in the next year? No    Would the patient like to discuss contraceptive options today? No      Contraception Wrap Up   Current Method No Contraceptive Precautions    End Method No Contraception Precautions    Contraception Counseling Provided No            Examination chaperoned by Celene Squibb LPN  Assessment:     1. Pregnancy test negative  2. Routine cervical smear Pap sent  Pap in 3 years if normal   3. Screen for STD  (sexually transmitted disease) GC/CHL on pap  4. Right lower quadrant abdominal pain  5. Ovulation pain Suspected ovulation pain Will watch for now  6. Unprotected sex Sent in rx for Plan B Meds ordered this encounter  Medications   levonorgestrel (PLAN B 1-STEP) 1.5 MG tablet    Sig: Take 1 tablet (1.5 mg total) by mouth once for 1 dose.    Dispense:  1 tablet    Refill:  0    Order Specific Question:   Supervising Provider    Answer:   Florian Buff [[id number]]       Plan:     Follow up in 4 weeks for ROS

## 2021-04-14 ENCOUNTER — Telehealth (INDEPENDENT_AMBULATORY_CARE_PROVIDER_SITE_OTHER): Payer: Medicaid Other | Admitting: Psychiatry

## 2021-04-14 ENCOUNTER — Encounter (HOSPITAL_COMMUNITY): Payer: Self-pay | Admitting: Psychiatry

## 2021-04-14 DIAGNOSIS — F411 Generalized anxiety disorder: Secondary | ICD-10-CM

## 2021-04-14 DIAGNOSIS — F32A Depression, unspecified: Secondary | ICD-10-CM | POA: Diagnosis not present

## 2021-04-14 MED ORDER — CITALOPRAM HYDROBROMIDE 40 MG PO TABS: 40.0000 mg | ORAL_TABLET | Freq: Every day | ORAL | 2 refills | Status: DC

## 2021-04-14 MED ORDER — HYDROXYZINE HCL 10 MG PO TABS: 10.0000 mg | ORAL_TABLET | Freq: Three times a day (TID) | ORAL | 3 refills | Status: DC | PRN

## 2021-04-14 NOTE — Progress Notes (Signed)
BH MD/PA/NP OP Progress Note Virtual Visit via Telephone Note  I connected with Jody Hansen on 04/14/21 at  4:00 PM EST by telephone and verified that I am speaking with the correct person using two identifiers.  Location: Patient: home Provider: Clinic   I discussed the limitations, risks, security and privacy concerns of performing an evaluation and management service by telephone and the availability of in person appointments. I also discussed with the patient that there may be a patient responsible charge related to this service. The patient expressed understanding and agreed to proceed.   I provided 30 minutes of non-face-to-face time during this encounter.  04/14/2021 9:58 AM ROREY HODGES  MRN:  [id number]  Chief Complaint: "Im fine but I'm just stressed out about my job"  HPI: 26 year old female seen today for follow up psychiatric evaluation.  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 She is currently managed on Celexa 40 mg and Hydroxyzine 10 mg three times. She notes her medications are effective in managing her psychiatric conditions.  Today she was unable to logon virtually so assessment was done over the phone.  During exam she was pleasant, cooperate, and engaged in conversation.  She informed Probation officer that overall she is doing fine but notes that she is stressed about her job at the [employer].  She informed Probation officer that her hours are cut back weekly.  Because of this she notes that she worries about finances, bills, and job security.  She informed Probation officer that she has been applying for new positions on [email or web address].  Despite financial stressors patient notes that her anxiety and depression are well managed.  Today provider conducted a GAD-7 and patient scored a 10, at her last visit she scored a 9.  Provider also conducted PHQ-9 and patient scored a 4, at her last visit she scored a 2.  She endorsed adequate sleep and appetite.  Today she denies   SI/HI/VAH, mania, or paranoia.    Patient informed writer that she has mild pain around her pelvis.  She notes that she went to her OB/GYN yesterday who notes that it is uterine pain that potentially could be explained by ovulation.  At this time she denies being pregnant.    No medication changes made today.  Patient agreeable to continue medications as prescribed.  She will follow-up with outpatient counseling for therapy.  No other concerns at this time.     Visit Diagnosis:    ICD-10-CM   1. Generalized anxiety disorder  F41.1 citalopram (CELEXA) 40 MG tablet    hydrOXYzine (ATARAX) 10 MG tablet    2. Mild depression  F32.A citalopram (CELEXA) 40 MG tablet      Past Psychiatric History: anxiety and postpartum depression  Past Medical History:  Past Medical History:  Diagnosis Date   Kidney stones 2017   Pregnant 01/23/2015   Scoliosis     Past Surgical History:  Procedure Laterality Date   NO PAST SURGERIES      Family Psychiatric History: Denies  Family History:  Family History  Problem Relation Age of Onset   Diabetes Paternal Grandfather    Diabetes Paternal Grandmother    Cancer Maternal Grandmother    Cancer Maternal Grandfather    Diabetes Father    Diabetes Mother    Diabetes Sister        borderline   Lung disease Paternal Aunt     Social History:  Social History   Socioeconomic History  Marital status: Significant Other    Spouse name: Not on file   Number of children: 1   Years of education: Not on file   Highest education level: High school graduate  Occupational History   Occupation: call center  Tobacco Use   Smoking status: Never   Smokeless tobacco: Never  Vaping Use   Vaping Use: Never used  Substance and Sexual Activity   Alcohol use: No   Drug use: No   Sexual activity: Yes    Birth control/protection: None  Other Topics Concern   Not on file  Social History Narrative   Not on file   Social Determinants of Health    Financial Resource Strain: Not on file  Food Insecurity: Not on file  Transportation Needs: Not on file  Physical Activity: Not on file  Stress: Not on file  Social Connections: Not on file    Allergies:  Allergies  Allergen Reactions   Depo-Provera [Medroxyprogesterone Acetate] Other (See Comments)    Weight gain; bruising; headaches    Metabolic Disorder Labs: No results found for: HGBA1C, MPG No results found for: PROLACTIN No results found for: CHOL, TRIG, HDL, CHOLHDL, VLDL, LDLCALC Lab Results  Component Value Date   TSH 2.070 03/18/2019   TSH 2.410 04/03/2018    Therapeutic Level Labs: No results found for: LITHIUM No results found for: VALPROATE No components found for:  CBMZ  Current Medications: Current Outpatient Medications  Medication Sig Dispense Refill   citalopram (CELEXA) 40 MG tablet Take 1 tablet (40 mg total) by mouth daily. 30 tablet 2   hydrOXYzine (ATARAX) 10 MG tablet Take 1 tablet (10 mg total) by mouth 3 (three) times daily as needed. 90 tablet 3   ibuprofen (ADVIL) 600 MG tablet Take 1 tablet (600 mg total) by mouth every 8 (eight) hours as needed. (Patient not taking: Reported on 04/13/2021) 30 tablet 0   Vitamin D, Ergocalciferol, (DRISDOL) 1.25 MG (50000 UNIT) CAPS capsule Take 50,000 Units by mouth once a week. (Patient not taking: Reported on 04/13/2021)     No current facility-administered medications for this visit.     Musculoskeletal: Strength & Muscle Tone:  Unable to assess due to telephone visit  Gait & Station:   Unable to assess due to telephone visit  Patient leans: N/A  Psychiatric Specialty Exam: Review of Systems  Last menstrual period 03/28/2021, not currently breastfeeding.There is no height or weight on file to calculate BMI.  General Appearance:  Unable to assess due to telephone visit  Eye Contact:   Unable to assess due to telephone visit  Speech:  Clear and Coherent and Normal Rate  Volume:  Normal  Mood:   Euthymic  Affect:  Appropriate and Congruent  Thought Process:  Coherent, Goal Directed, and Linear  Orientation:  Full (Time, Place, and Person)  Thought Content: WDL and Logical   Suicidal Thoughts:  No  Homicidal Thoughts:  No  Memory:  Immediate;   Good Recent;   Good Remote;   Good  Judgement:  Good  Insight:  Good  Psychomotor Activity:    Unable to assess due to telephone visit   Concentration:  Concentration: Good and Attention Span: Good  Recall:  Good  Fund of Knowledge: Good  Language: Good  Akathisia:  No  Handed:  Right  AIMS (if indicated): not done  Assets:  Communication Skills Desire for Improvement Financial Resources/Insurance Housing Intimacy Leisure Time Social Support  ADL's:  Intact  Cognition: WNL  Sleep:  Good   Screenings: GAD-7    Flowsheet Row Video Visit from 04/14/2021 in [facility] Video Visit from 02/10/2021 in [facility] Video Visit from 11/04/2020 in [facility] Office Visit from 08/07/2020 in [facility] from 07/14/2020 in [facility]  Total GAD-7 Score 10 9 19 21 20       PHQ2-9    Flowsheet Row Video Visit from 04/14/2021 in [facility] Video Visit from 02/10/2021 in [facility] Video Visit from 11/04/2020 in [facility] Office Visit from 10/19/2020 in [place] Office Visit from 08/07/2020 in [facility]  PHQ-2 Total Score 0 0 4 0 5  PHQ-9 Total Score 4 2 15  -- [place] [facility] from 03/04/2021 in [facility] [place] from 08/07/2020 in [facility] ED from 07/29/2020 in [facility] [place] No Risk Error: Question 6 not populated No Risk        Assessment  and Plan: Patient notes that she is doing well on her current medication regimen.  No medication changes made today.  Patient agreeable to continue medications as prescribed.  1. Generalized anxiety disorder  Continue- citalopram (CELEXA) 40 MG tablet; Take 40 mg daily  Dispense: 30 tablet; Refill: 3 Continue- hydrOXYzine (ATARAX/VISTARIL) 10 MG tablet; Take 1 tablet (10 mg total) by mouth 3 (three) times daily as needed.  Dispense: 90 tablet; Refill: 3  2. Mild depression  Continue- citalopram (CELEXA) 40 MG tablet; Take 1 tablet (40 mg total) by mouth daily.  Dispense: 30 tablet; Refill: 2  Follow-up in 3 months Follow-up with therapy  Salley Slaughter, NP 04/14/2021, 9:58 AM

## 2021-04-15 ENCOUNTER — Other Ambulatory Visit: Payer: Self-pay | Admitting: Adult Health

## 2021-04-15 LAB — CYTOLOGY - PAP
Adequacy: ABSENT
Chlamydia: NEGATIVE
Comment: NEGATIVE
Comment: NEGATIVE
Comment: NORMAL
Diagnosis: NEGATIVE
High risk HPV: NEGATIVE
Neisseria Gonorrhea: NEGATIVE

## 2021-04-15 MED ORDER — METRONIDAZOLE 500 MG PO TABS: 500.0000 mg | ORAL_TABLET | Freq: Two times a day (BID) | ORAL | 0 refills | Status: DC

## 2021-04-15 NOTE — Progress Notes (Signed)
+  BV on pap will rx flagyl  

## 2021-05-11 ENCOUNTER — Ambulatory Visit: Payer: Medicaid Other | Admitting: Adult Health

## 2021-05-12 ENCOUNTER — Encounter: Payer: Self-pay | Admitting: Family Medicine

## 2021-05-12 DIAGNOSIS — F419 Anxiety disorder, unspecified: Secondary | ICD-10-CM

## 2021-05-12 DIAGNOSIS — F32A Depression, unspecified: Secondary | ICD-10-CM

## 2021-05-13 NOTE — Telephone Encounter (Signed)
Nurses ?May have referral for therapist ?If suffering with depression or other problems that would need to be seen by [place] as well we are more than happy to help her out also otherwise go ahead with referral for therapist thanks ?

## 2021-05-13 NOTE — Telephone Encounter (Signed)
Patient states she would like the referral for stress, anxiety and depression.  Patient states she has no thoughts of harming herself or others. Patient declined appt with Dr Nicki Reaper and would just like referral to Therapist. ? ?Referral ordered in EPIC ?

## 2021-05-14 IMAGING — US OBSTETRIC <14 WK ULTRASOUND
1 series · 14 of 19 positions shown · non-contrast
Comparison: 08/03/2018

CLINICAL DATA: Miscarriage

EXAM:
OBSTETRIC <14 WK ULTRASOUND
TECHNIQUE: Transabdominal ultrasound was performed for evaluation of the
gestation as well as the maternal uterus and adnexal regions.

[Series 1: obstetric <14 wk ultrasound · 19 acquisitions, 14 frames shown]
[im 1/19]
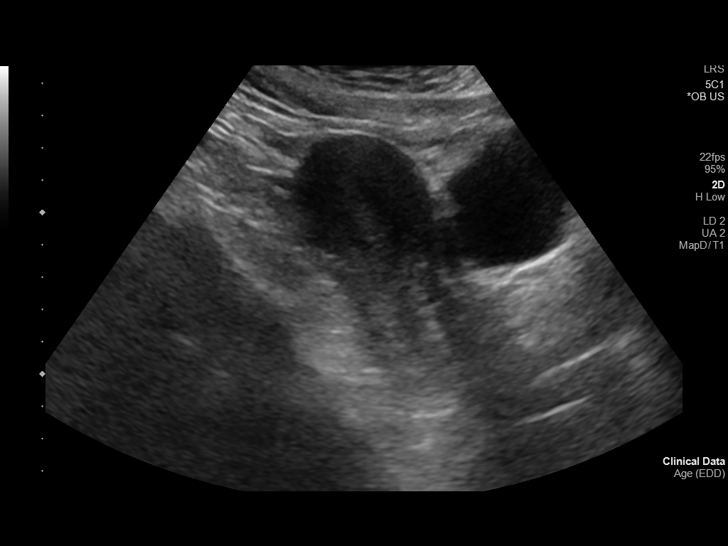
[im 3/19]
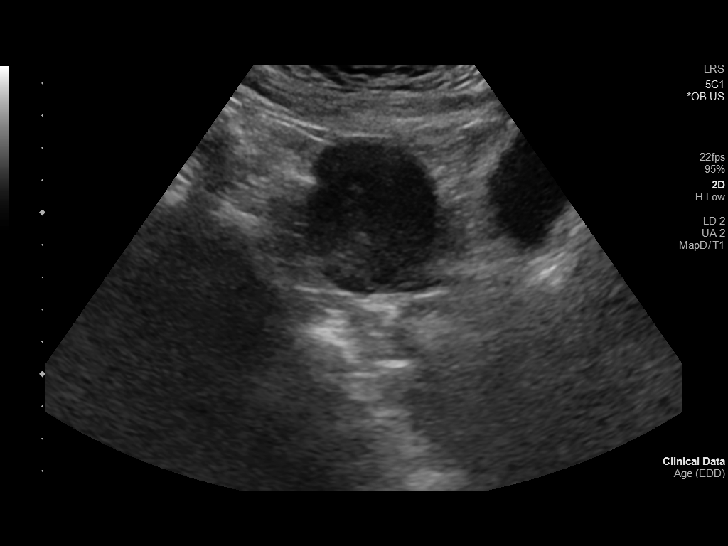
[im 4/19]
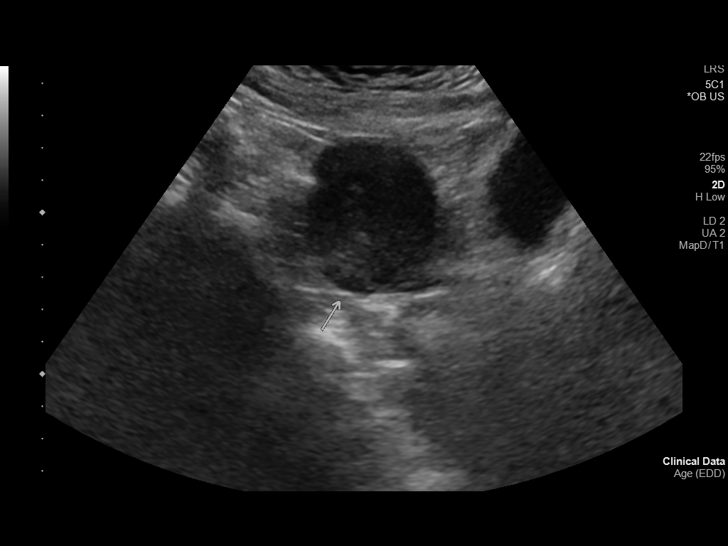
[im 5/19]
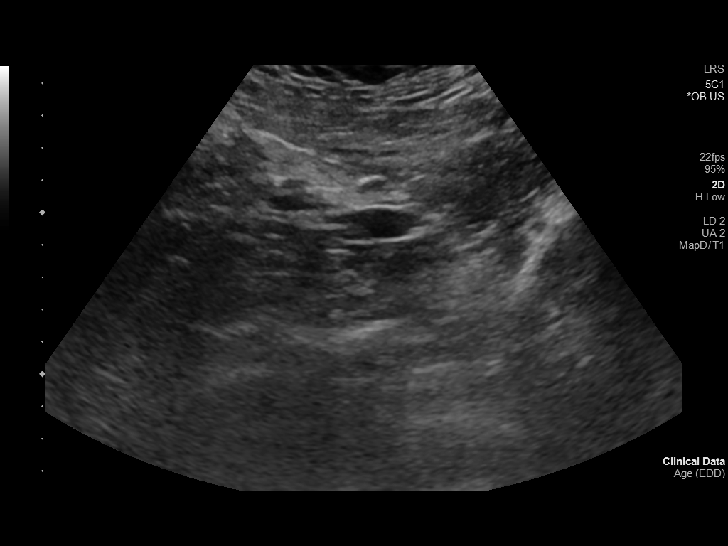
[im 7/19]
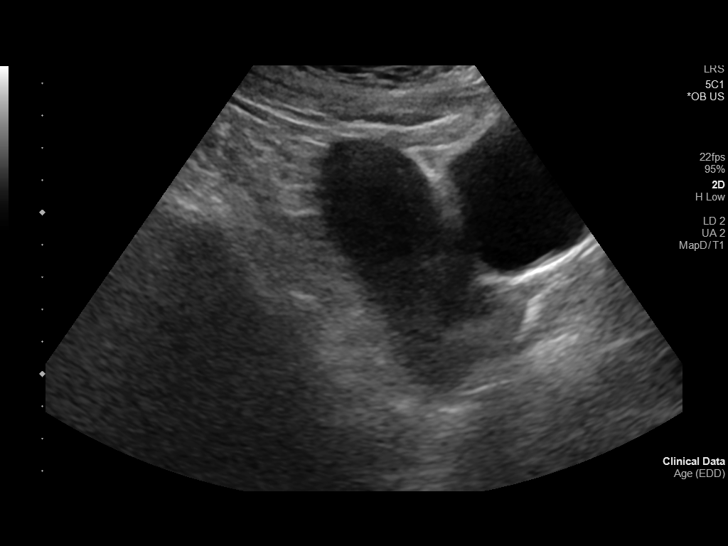
[im 8/19]
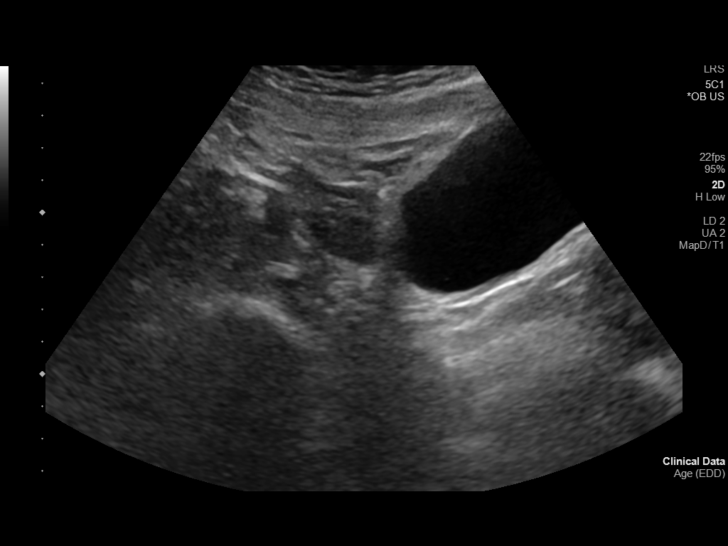
[im 9/19]
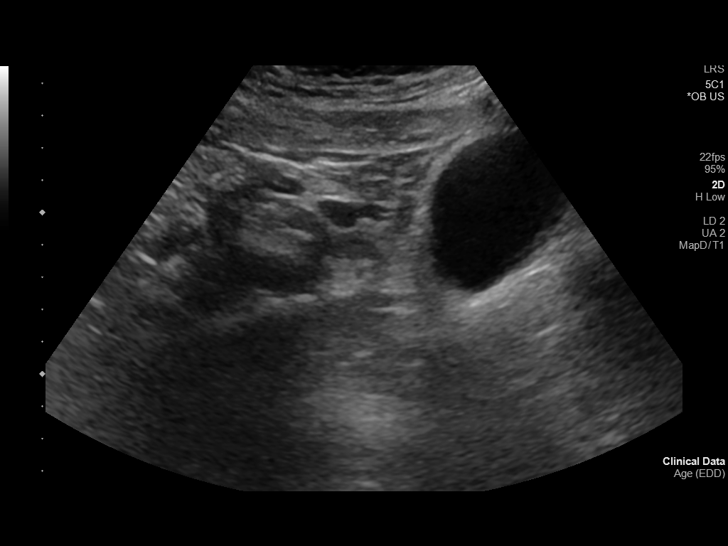
[im 11/19]
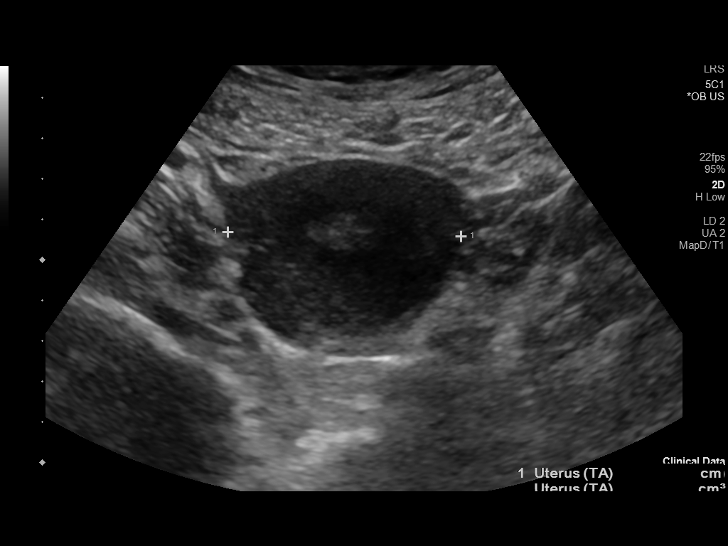
[im 12/19]
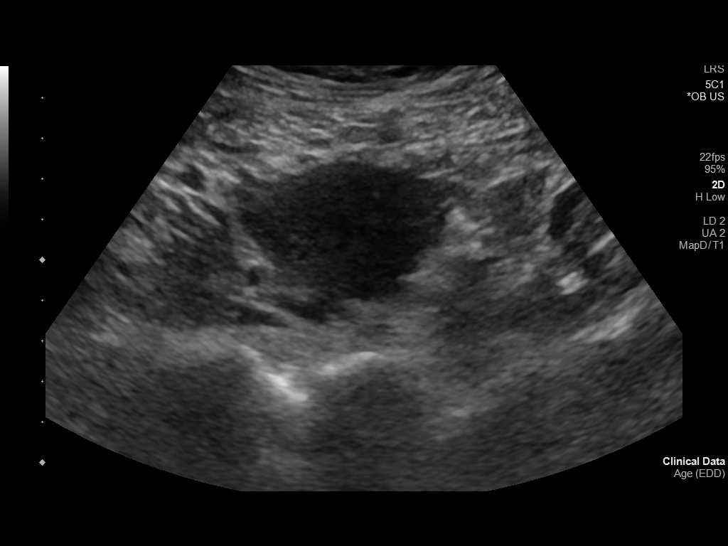
[im 13/19]
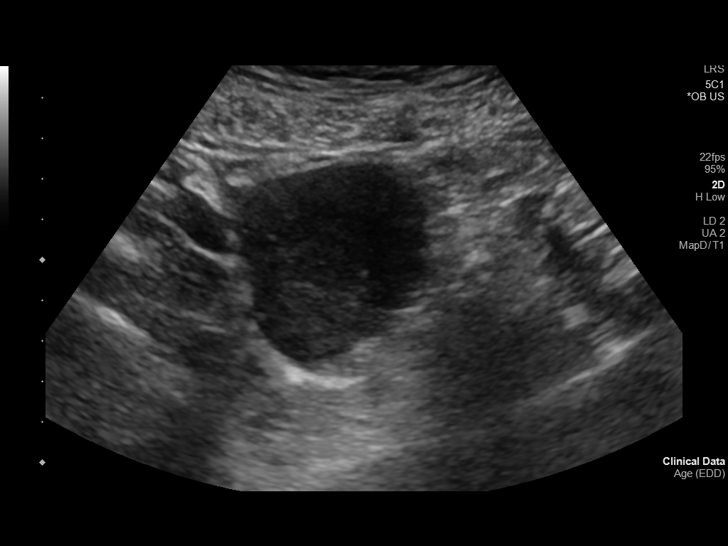
[im 15/19]
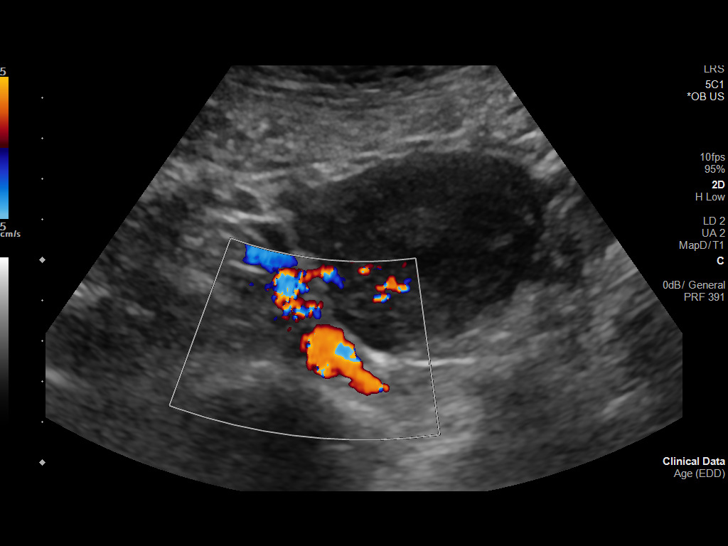
[im 16/19]
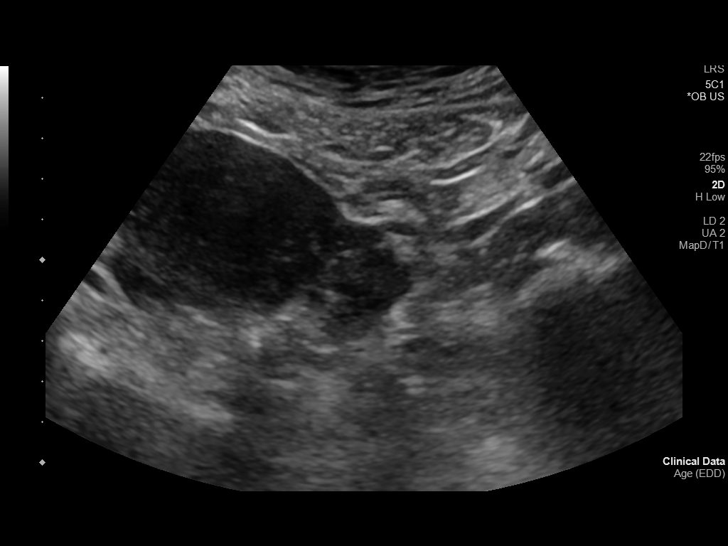
[im 17/19]
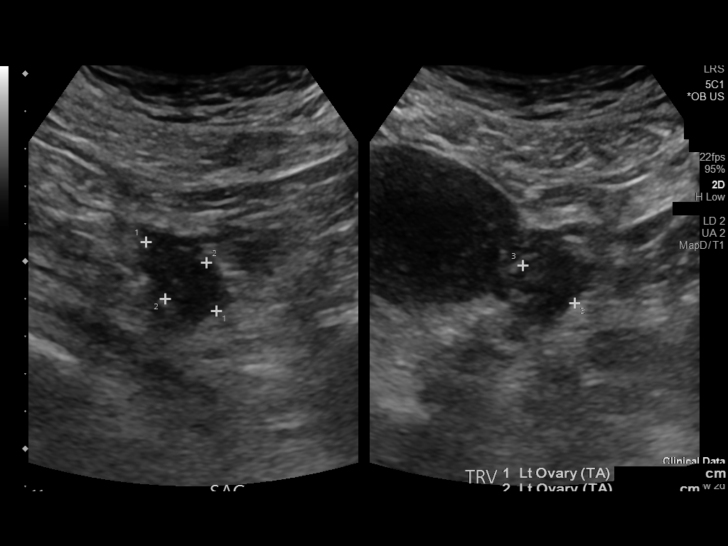
[im 19/19]
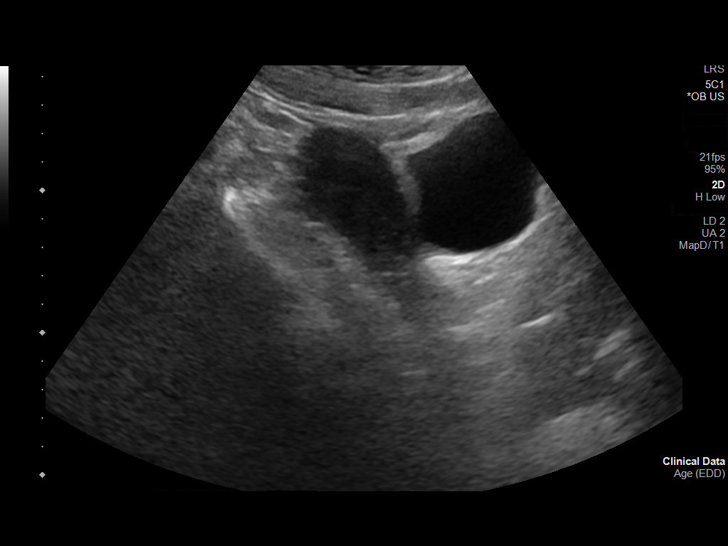

[14 of 19 positions shown; findings below may reference images not displayed]

FINDINGS: IUP seen previously is no longer visualized. Smooth endometrial
stripe which measures 9 mm. Symmetric normal appearance of the
ovaries. No pelvic fluid or adnexal mass.
IMPRESSION: IUP seen 08/03/2018 is no longer visualized. The endometrial stripe
is 9 mm and homogeneous.

## 2021-05-19 ENCOUNTER — Ambulatory Visit (INDEPENDENT_AMBULATORY_CARE_PROVIDER_SITE_OTHER): Payer: Medicaid Other | Admitting: *Deleted

## 2021-05-19 ENCOUNTER — Other Ambulatory Visit: Payer: Self-pay | Admitting: Adult Health

## 2021-05-19 ENCOUNTER — Encounter: Payer: Self-pay | Admitting: *Deleted

## 2021-05-19 ENCOUNTER — Other Ambulatory Visit: Payer: Self-pay

## 2021-05-19 VITALS — BP 112/72 | HR 91

## 2021-05-19 DIAGNOSIS — Z3200 Encounter for pregnancy test, result unknown: Secondary | ICD-10-CM

## 2021-05-19 DIAGNOSIS — Z3201 Encounter for pregnancy test, result positive: Secondary | ICD-10-CM | POA: Diagnosis not present

## 2021-05-19 LAB — POCT URINE PREGNANCY: Preg Test, Ur: POSITIVE — AB

## 2021-05-19 MED ORDER — PROMETHAZINE HCL 25 MG PO TABS: 25.0000 mg | ORAL_TABLET | Freq: Four times a day (QID) | ORAL | 1 refills | Status: DC | PRN

## 2021-05-19 NOTE — Progress Notes (Signed)
Will rx phenergan  

## 2021-05-19 NOTE — Progress Notes (Addendum)
? ?  NURSE VISIT- PREGNANCY CONFIRMATION  ? ?SUBJECTIVE:  ?Jody Hansen is a 27 y.o. [phone] female at 54w6dby certain LMP of Patient's last menstrual period was 04/22/2021. Here for pregnancy confirmation.  Home pregnancy test: positive x 3   She reports no complaints.  She is not taking prenatal vitamins.   ? ?OBJECTIVE:  ?BP 112/72 (BP Location: Left Arm, Patient Position: Sitting, Cuff Size: Normal)   Pulse 91   LMP 04/22/2021   Breastfeeding Unknown   ?Appears well, in no apparent distress ? ?Results for orders placed or performed in visit on 05/19/21 (from the past 24 hour(s))  ?POCT urine pregnancy  ? Collection Time: 05/19/21 10:59 AM  ?Result Value Ref Range  ? Preg Test, Ur Positive (A) Negative  ? ? ?ASSESSMENT: ?Positive pregnancy test, 344w6dy LMP   ? ?PLAN: ?Schedule for dating ultrasound in 4 weeks ?Prenatal vitamins: plans to begin OTC ASAP   ?[facility] ?OB packet given: Yes ? ?AmJanece Canterbury?05/19/2021 ?11:00 AM  ?Chart reviewed for nurse visit. Agree with plan of care. Rx sent in for phenergan  ?GrEstill DoomsNP ?05/19/2021 1:06 PM   ?

## 2021-05-23 ENCOUNTER — Encounter (HOSPITAL_COMMUNITY): Payer: Self-pay

## 2021-05-23 ENCOUNTER — Other Ambulatory Visit: Payer: Self-pay

## 2021-05-23 ENCOUNTER — Emergency Department (HOSPITAL_COMMUNITY)
Admission: EM | Admit: 2021-05-23 | Discharge: 2021-05-23 | Disposition: A | Discharge status: Home / Self Care | Payer: Medicaid Other | Attending: Emergency Medicine | Admitting: Emergency Medicine

## 2021-05-23 DIAGNOSIS — O209 Hemorrhage in early pregnancy, unspecified: Secondary | ICD-10-CM | POA: Diagnosis present

## 2021-05-23 DIAGNOSIS — O039 Complete or unspecified spontaneous abortion without complication: Secondary | ICD-10-CM | POA: Insufficient documentation

## 2021-05-23 LAB — URINALYSIS, ROUTINE W REFLEX MICROSCOPIC
Bilirubin Urine: NEGATIVE
Glucose, UA: NEGATIVE mg/dL
Ketones, ur: NEGATIVE mg/dL
Leukocytes,Ua: NEGATIVE
Nitrite: NEGATIVE
Protein, ur: NEGATIVE mg/dL
Specific Gravity, Urine: 1.02 (ref 1.005–1.030)
pH: 7 (ref 5.0–8.0)

## 2021-05-23 LAB — URINALYSIS, MICROSCOPIC (REFLEX)

## 2021-05-23 LAB — HCG, QUANTITATIVE, PREGNANCY: hCG, Beta Chain, Quant, S: 1 m[IU]/mL (ref <5)

## 2021-05-23 LAB — TYPE AND SCREEN
ABO/RH(D): A POS
Antibody Screen: NEGATIVE

## 2021-05-23 NOTE — ED Provider Notes (Signed)
?[place] DEPT ?Provider Note ? ? ?CSN: [id number] ?Arrival date & time: 05/23/21  1122 ? ?  ? ?History ? ?No chief complaint on file. ? ? ?Jody Hansen is a 26 y.o. female G4P2 presents to the ED for evaluation of vaginal bleeding yesterday. She reports she is around 2w3dand had a positive urine pregnancy test at her OB office on 05/19/21. She sent her OB a message yesterday, but didn't get a reply. She reports some mild suprapubic pain intermittently. Denies any dysuria or hematuria. She reports she is not bleeding heavily and she was passing some tissue/small clots yesterday and today. She denies any fevers. Denies any nausea, vomiting, constipation, or diarrhea. LMP was 04/22/21. ? ?HPI ? ?  ? ?Home Medications ?Prior to Admission medications   ?Medication Sig Start Date End Date Taking? Authorizing Provider  ?metroNIDAZOLE (FLAGYL) 500 MG tablet Take 1 tablet (500 mg total) by mouth 2 (two) times daily. 04/15/21   GEstill Dooms NP  ?promethazine (PHENERGAN) 25 MG tablet Take 1 tablet (25 mg total) by mouth every 6 (six) hours as needed for nausea or vomiting. 05/19/21   GEstill Dooms NP  ?citalopram (CELEXA) 40 MG tablet Take 1 tablet (40 mg total) by mouth daily. 04/14/21   PSalley Slaughter NP  ?hydrOXYzine (ATARAX) 10 MG tablet Take 1 tablet (10 mg total) by mouth 3 (three) times daily as needed. 04/14/21   PSalley Slaughter NP  ?ibuprofen (ADVIL) 600 MG tablet Take 1 tablet (600 mg total) by mouth every 8 (eight) hours as needed. ?Patient not taking: Reported on 04/13/2021 03/04/21   LChase Picket MD  ?Vitamin D, Ergocalciferol, (DRISDOL) 1.25 MG (50000 UNIT) CAPS capsule Take 50,000 Units by mouth once a week. ?Patient not taking: Reported on 04/13/2021 03/31/21   [provider]  ?   ? ?Allergies    ?Depo-provera [medroxyprogesterone acetate]   ? ?Review of Systems   ?Review of Systems  ?Constitutional:  Negative for chills and fever.   ?Gastrointestinal:  Positive for abdominal pain. Negative for constipation, diarrhea, nausea and vomiting.  ?Genitourinary:  Positive for vaginal bleeding. Negative for dysuria, hematuria and urgency.  ? ?Physical Exam ?Updated Vital Signs ?BP 128/73 (BP Location: Left Arm)   Pulse 82   Temp 97.8 ?F (36.6 ?C) (Oral)   Resp 16   Ht '5\' 2"'$  (1.575 m)   Wt 90.3 kg   LMP 04/22/2021   SpO2 94%   BMI 36.40 kg/m?  ?Physical Exam ?Vitals and nursing note reviewed.  ?Constitutional:   ?   General: She is not in acute distress. ?   Appearance: Normal appearance. She is not ill-appearing or toxic-appearing.  ?Eyes:  ?   General: No scleral icterus. ?Cardiovascular:  ?   Rate and Rhythm: Normal rate and regular rhythm.  ?Pulmonary:  ?   Effort: Pulmonary effort is normal. No respiratory distress.  ?Abdominal:  ?   General: Bowel sounds are normal.  ?   Palpations: Abdomen is soft.  ?   Tenderness: There is no abdominal tenderness. There is no guarding or rebound.  ?   Comments: Abdomen soft, non-distended. NBS. She denies any pain on palpation. No masses palpable.   ?Genitourinary: ?   Comments: Exam deferred ?Skin: ?   General: Skin is dry.  ?   Findings: No rash.  ?Neurological:  ?   General: No focal deficit present.  ?   Mental Status: She is alert. Mental status is  at baseline.  ?Psychiatric:     ?   Mood and Affect: Mood normal.  ? ? ?ED Results / Procedures / Treatments   ?Labs ?(all labs ordered are listed, but only abnormal results are displayed) ?Labs Reviewed  ?URINALYSIS, ROUTINE W REFLEX MICROSCOPIC - Abnormal; Notable for the following components:  ?    Result Value  ? Hgb urine dipstick LARGE (*)   ? All other components within normal limits  ?URINALYSIS, MICROSCOPIC (REFLEX) - Abnormal; Notable for the following components:  ? Bacteria, UA RARE (*)   ? All other components within normal limits  ?HCG, QUANTITATIVE, PREGNANCY  ?TYPE AND SCREEN  ? ? ?EKG ?None ? ?Radiology ?No results  found. ? ?Procedures ?Procedures  ? ? ?Medications Ordered in ED ?Medications - No data to display ? ?ED Course/ Medical Decision Making/ A&P ?  ?                        ?Medical Decision Making ? ?26 year old G64, P2 female presents the emergency department for evaluation of vaginal bleeding after positive urine pregnancy test.  Patient reports she is around 4 weeks and 3 days with her last menstrual period being 04-22-2021.  She reports the bleeding started yesterday and was light but is increased.  She reports passing some tissue or clots.  Denies any heavy menstrual bleeding. ? ?I independently reviewed the patient's labs.  Urinalysis shows large amount of hemoglobin with 21-50 red blood cells seen on microscopy.  There is rare bacteria but 0-5 white blood cells seen.  Patient's beta-hCG was 1.  Not consistent with pregnancy.  Not consistent with UTI. ? ?Given the patient's negative hCG, the patient likely either had a false positive urine pregnancy test or had an early miscarriage as she is only 3 days late for her menstrual cycle. ? ?I discussed with the patient we could do a possible ultrasound to see if there is any retained fetal products. The patient declines any imaging.   ? ?I recommended the patient follow-up with her OB/GYN.  Red flag symptoms were discussed such as worsening abdominal pain, fever, vomiting, nausea, heavy menstrual bleeding to return to the emergency department for reevaluation.  Patient verbalized understanding and agrees to plan.  Time was left for questions.  Patient is stable and being discharged home in good condition. ? ?I discussed this case with my attending physician who cosigned this note including patient's presenting symptoms, physical exam, and planned diagnostics and interventions. Attending physician stated agreement with plan or made changes to plan which were implemented.  ? ?Final Clinical Impression(s) / ED Diagnoses ?Final diagnoses:  ?Miscarriage  ? ? ?Rx / DC  Orders ?ED Discharge Orders   ? ? None  ? ?  ? ? ?  ?Sherrell Puller, PA-C ?05/24/21 2316 ? ?  ?Valarie Merino, MD ?05/25/21 0010 ? ?

## 2021-05-23 NOTE — ED Triage Notes (Signed)
Patient reports that she began having vaginal bleeding/small amount . Patient states she is 4 weeks 3 days pregnant. Patient reports lower back pain and light abdominal cramping today. ?

## 2021-05-23 NOTE — Discharge Instructions (Signed)
You were seen here today for evaluation of your vaginal bleeding. Your blood pregnancy test was negative. This is likely a false positive urine pregnancy test or an early miscarriage. Please follow up with your OBGYN. If you have worsening abdominal pain, fever, heavy vaginal bleeding, lightheadedness, please return to the ER for re-evaluation.  ? ?Contact a health care provider if: ?You have a fever or chills. ?There is bad-smelling fluid coming from the vagina. ?You have more bleeding instead of less. ?Tissue or blood clots come out of your vagina. ? ?Get help right away if: ?You have severe cramps or pain in your back or abdomen. ?Heavy bleeding soaks through 2 large sanitary pads an hour for more than 2 hours. ?You become light-headed or weak. ?You faint. ?You feel sad, and your sadness takes over your thoughts. ?You think about hurting yourself. ?

## 2021-05-23 NOTE — ED Provider Notes (Incomplete)
?  [place] DEPT ?Provider Note ? ? ?CSN: [id number] ?Arrival date & time: 05/23/21  1122 ? ?  ? ?History ?No chief complaint on file. ? ? ?Jody Hansen is a 26 y.o. female G4P2 ? ?Her second preg was a miscarraige in the second  ? ?HPI ? ?  ? ?Home Medications ?Prior to Admission medications   ?Medication Sig Start Date End Date Taking? Authorizing Provider  ?metroNIDAZOLE (FLAGYL) 500 MG tablet Take 1 tablet (500 mg total) by mouth 2 (two) times daily. 04/15/21   Estill Dooms, NP  ?promethazine (PHENERGAN) 25 MG tablet Take 1 tablet (25 mg total) by mouth every 6 (six) hours as needed for nausea or vomiting. 05/19/21   Estill Dooms, NP  ?citalopram (CELEXA) 40 MG tablet Take 1 tablet (40 mg total) by mouth daily. 04/14/21   Salley Slaughter, NP  ?hydrOXYzine (ATARAX) 10 MG tablet Take 1 tablet (10 mg total) by mouth 3 (three) times daily as needed. 04/14/21   Salley Slaughter, NP  ?ibuprofen (ADVIL) 600 MG tablet Take 1 tablet (600 mg total) by mouth every 8 (eight) hours as needed. ?Patient not taking: Reported on 04/13/2021 03/04/21   Chase Picket, MD  ?Vitamin D, Ergocalciferol, (DRISDOL) 1.25 MG (50000 UNIT) CAPS capsule Take 50,000 Units by mouth once a week. ?Patient not taking: Reported on 04/13/2021 03/31/21   [provider]  ?   ? ?Allergies    ?Depo-provera [medroxyprogesterone acetate]   ? ?Review of Systems   ?Review of Systems ? ?Physical Exam ?Updated Vital Signs ?BP 128/73 (BP Location: Left Arm)   Pulse 82   Temp 97.8 ?F (36.6 ?C) (Oral)   Resp 16   Ht '5\' 2"'$  (1.575 m)   Wt 90.3 kg   LMP 04/22/2021   SpO2 94%   BMI 36.40 kg/m?  ?Physical Exam ? ?ED Results / Procedures / Treatments   ?Labs ?(all labs ordered are listed, but only abnormal results are displayed) ?Labs Reviewed  ?URINALYSIS, ROUTINE W REFLEX MICROSCOPIC - Abnormal; Notable for the following components:  ?    Result Value  ? Hgb urine dipstick LARGE (*)   ? All other  components within normal limits  ?URINALYSIS, MICROSCOPIC (REFLEX) - Abnormal; Notable for the following components:  ? Bacteria, UA RARE (*)   ? All other components within normal limits  ?HCG, QUANTITATIVE, PREGNANCY  ?TYPE AND SCREEN  ? ? ?EKG ?None ? ?Radiology ?No results found. ? ?Procedures ?Procedures  ?{Document cardiac monitor, telemetry assessment procedure when appropriate:1} ? ?Medications Ordered in ED ?Medications - No data to display ? ?ED Course/ Medical Decision Making/ A&P ?  ?                        ?Medical Decision Making ? ?*** ? ?{Document critical care time when appropriate:1} ?{Document review of labs and clinical decision tools ie heart score, Chads2Vasc2 etc:1}  ?{Document your independent review of radiology images, and any outside records:1} ?{Document your discussion with family members, caretakers, and with consultants:1} ?{Document social determinants of health affecting pt's care:1} ?{Document your decision making why or why not admission, treatments were needed:1} ?Final Clinical Impression(s) / ED Diagnoses ?Final diagnoses:  ?None  ? ? ?Rx / DC Orders ?ED Discharge Orders   ? ? None  ? ?  ? ? ?

## 2021-05-23 NOTE — ED Provider Triage Note (Signed)
Emergency Medicine Provider Triage Evaluation Note ? ?Mila Palmer , a 26 y.o. female  was evaluated in triage.  Pt complains of vaginal bleeding with small clots since yesterday. Reports 4weeks3days pregnant. G4P2. No lightheadedness, severe abdominal pain. One previous miscarriage, no other pregnancy complications. Thinks she is A+ blood type. ? ?Review of Systems  ?Positive: Vaginal bleeding ?Negative: Syncope, abdominal pain ? ?Physical Exam  ?BP 128/73 (BP Location: Left Arm)   Pulse 82   Temp 97.8 ?F (36.6 ?C) (Oral)   Resp 16   Ht '5\' 2"'$  (1.575 m)   Wt 90.3 kg   LMP 04/22/2021   SpO2 94%   BMI 36.40 kg/m?  ?Gen:   Awake, no distress   ?Resp:  Normal effort ?MSK:   Moves extremities without difficulty  ?Other:   ? ?Medical Decision Making  ?Medically screening exam initiated at 11:41 AM.  Appropriate orders placed.  CATALEYA CRISTINA was informed that the remainder of the evaluation will be completed by another provider, this initial triage assessment does not replace that evaluation, and the importance of remaining in the ED until their evaluation is complete. ? ?4week pregnancy bleeding eval ?  ?Anselmo Pickler, PA-C ?05/23/21 1143 ? ?

## 2021-05-24 ENCOUNTER — Telehealth: Payer: Self-pay

## 2021-05-24 ENCOUNTER — Encounter: Payer: Self-pay | Admitting: *Deleted

## 2021-05-24 NOTE — Telephone Encounter (Signed)
Transition Care Management Unsuccessful Follow-up Telephone Call ? ?Date of discharge and from where:  05/23/2021-[facility] ? ?Attempts:  1st Attempt ? ?Reason for unsuccessful TCM follow-up call:  Left voice message ? ?  ?

## 2021-05-26 NOTE — Telephone Encounter (Signed)
Transition Care Management Unsuccessful Follow-up Telephone Call ? ?Date of discharge and from where:  05/23/2021-[facility]  ? ?Attempts:  2nd Attempt ? ?Reason for unsuccessful TCM follow-up call:  Left voice message ? ?  ?

## 2021-05-27 NOTE — Telephone Encounter (Signed)
Transition Care Management Unsuccessful Follow-up Telephone Call ? ?Date of discharge and from where:  05/23/2021-[facility]  ? ?Attempts:  3rd Attempt ? ?Reason for unsuccessful TCM follow-up call:  Left voice message ? ?  ?

## 2021-06-17 ENCOUNTER — Other Ambulatory Visit: Payer: Medicaid Other

## 2021-06-22 ENCOUNTER — Ambulatory Visit: Payer: Medicaid Other | Admitting: Family Medicine

## 2021-06-22 ENCOUNTER — Encounter: Payer: Self-pay | Admitting: Family Medicine

## 2021-06-22 VITALS — BP 132/87 | HR 92 | Temp 97.6°F | Wt 205.6 lb

## 2021-06-22 DIAGNOSIS — F32A Depression, unspecified: Secondary | ICD-10-CM

## 2021-06-22 DIAGNOSIS — M545 Low back pain, unspecified: Secondary | ICD-10-CM

## 2021-06-22 DIAGNOSIS — G8929 Other chronic pain: Secondary | ICD-10-CM

## 2021-06-22 DIAGNOSIS — F411 Generalized anxiety disorder: Secondary | ICD-10-CM

## 2021-06-22 MED ORDER — MELOXICAM 15 MG PO TABS: 15.0000 mg | ORAL_TABLET | Freq: Every day | ORAL | 0 refills | Status: DC | PRN

## 2021-06-22 NOTE — Patient Instructions (Signed)
Referral to PT placed. ? ?Medication as directed. ? ?Working on MRI. ? ?Take care ? ?Dr. Lacinda Axon  ?

## 2021-06-23 DIAGNOSIS — G8929 Other chronic pain: Secondary | ICD-10-CM | POA: Insufficient documentation

## 2021-06-23 NOTE — Assessment & Plan Note (Addendum)
Previously seen by Ortho.  Prior imaging normal.  Treating with meloxicam.  Arranging for MRI and physical therapy. ?

## 2021-06-23 NOTE — Progress Notes (Signed)
? ?Subjective:  ?Patient ID: Jody Hansen, female    DOB: May 17, 1995  Age: 26 y.o. MRN: [id number] ? ?CC: ?Chief Complaint  ?Patient presents with  ? Back Pain  ?  Pt having back pain; hx of scoliosis. Had x ray done and it showed fluid around spine. Specialists is trying to get MRI done. Specialist offered steroid injections. Pt states spine is sore when getting up.   ? ? ?HPI: ? ?26 year old female presents for evaluation of the above. ? ?Patient reports midline low back pain for the past 2 years.  Has previously seen orthopedics.  Had a negative x-ray.  Patient states that her pain is severe at times.  Worse with range of motion.  Interferes with ambulation at times.  No reports of saddle anesthesia or incontinence.  No medications or interventions tried.  She states that she was previously recommended to have an MRI but this was not done.  No other complaints or concerns at this time. ? ?Patient Active Problem List  ? Diagnosis Date Noted  ? Chronic midline low back pain 06/23/2021  ? Generalized anxiety disorder 08/07/2020  ? Severe episode of recurrent major depressive disorder, without psychotic features ([place]) 08/07/2020  ? History of shoulder dystocia in prior pregnancy 09/10/2019  ? Postpartum depression 08/28/2019  ? Migraines with aura 06/02/2016  ? Ganglion cyst 08/10/2015  ? Renal colic on right side 31/49/7026  ? ? ?Social Hx   ?Social History  ? ?Socioeconomic History  ? Marital status: Significant Other  ?  Spouse name: Not on file  ? Number of children: 1  ? Years of education: Not on file  ? Highest education level: High school graduate  ?Occupational History  ? Occupation: call center  ?Tobacco Use  ? Smoking status: Never  ? Smokeless tobacco: Never  ?Vaping Use  ? Vaping Use: Never used  ?Substance and Sexual Activity  ? Alcohol use: No  ? Drug use: No  ? Sexual activity: Yes  ?  Birth control/protection: None  ?Other Topics Concern  ? Not on file  ?Social History Narrative  ? Not on file   ? ?Social Determinants of Health  ? ?Financial Resource Strain: Not on file  ?Food Insecurity: Not on file  ?Transportation Needs: Not on file  ?Physical Activity: Not on file  ?Stress: Not on file  ?Social Connections: Not on file  ? ? ?Review of Systems ?Per HPI ? ?Objective:  ?BP 132/87   Pulse 92   Temp 97.6 ?F (36.4 ?C)   Wt 205 lb 9.6 oz (93.3 kg)   LMP 04/22/2021   SpO2 97%   BMI 37.60 kg/m?  ? ? ?  06/22/2021  ?  3:20 PM 05/23/2021  ? 11:31 AM 05/23/2021  ? 11:28 AM  ?BP/Weight  ?Systolic BP 378  588  ?Diastolic BP 87  73  ?Wt. (Lbs) 205.6 199   ?BMI 37.6 kg/m2 36.4 kg/m2   ? ? ?Physical Exam ?Constitutional:   ?   Appearance: Normal appearance. She is obese.  ?HENT:  ?   Head: Normocephalic and atraumatic.  ?Cardiovascular:  ?   Rate and Rhythm: Normal rate and regular rhythm.  ?Pulmonary:  ?   Effort: Pulmonary effort is normal.  ?   Breath sounds: Normal breath sounds. No wheezing or rales.  ?Musculoskeletal:  ?   Comments: Patient endorsing tenderness over the lumbar spine at the midline.  ?Neurological:  ?   Mental Status: She is alert.  ?Psychiatric:     ?  Mood and Affect: Mood normal.     ?   Behavior: Behavior normal.  ? ? ?Lab Results  ?Component Value Date  ? WBC 6.6 04/02/2020  ? HGB 14.6 04/02/2020  ? HCT 43.2 04/02/2020  ? PLT 296 04/02/2020  ? GLUCOSE 94 04/02/2020  ? ALT 24 04/02/2020  ? AST 24 04/02/2020  ? NA 137 04/02/2020  ? K 4.3 04/02/2020  ? CL 108 04/02/2020  ? CREATININE 0.77 04/02/2020  ? BUN 12 04/02/2020  ? CO2 23 04/02/2020  ? TSH 2.070 03/18/2019  ? ? ? ?Assessment & Plan:  ? ?Problem List Items Addressed This Visit   ? ?  ? Other  ? Chronic midline low back pain - Primary  ?  Previously seen by Ortho.  Prior imaging normal.  Treating with meloxicam.  Arranging for MRI and physical therapy. ? ?  ?  ? Relevant Medications  ? meloxicam (MOBIC) 15 MG tablet  ? Other Relevant Orders  ? Ambulatory referral to Physical Therapy  ? MR Lumbar Spine Wo Contrast  ? ? ?Meds ordered  this encounter  ?Medications  ? meloxicam (MOBIC) 15 MG tablet  ?  Sig: Take 1 tablet (15 mg total) by mouth daily as needed for pain.  ?  Dispense:  30 tablet  ?  Refill:  0  ? ? ?Thersa Salt DO ?[place] ? ?

## 2021-06-24 ENCOUNTER — Telehealth (HOSPITAL_COMMUNITY): Payer: Medicaid Other | Admitting: Psychiatry

## 2021-06-24 ENCOUNTER — Encounter (HOSPITAL_COMMUNITY): Payer: Self-pay

## 2021-07-05 ENCOUNTER — Ambulatory Visit: Payer: Medicaid Other | Admitting: Adult Health

## 2021-07-06 ENCOUNTER — Ambulatory Visit: Payer: Medicaid Other

## 2021-07-06 ENCOUNTER — Ambulatory Visit (HOSPITAL_COMMUNITY): Payer: Medicaid Other | Admitting: Physical Therapy

## 2021-07-07 ENCOUNTER — Encounter: Payer: Self-pay | Admitting: Adult Health

## 2021-07-07 ENCOUNTER — Ambulatory Visit: Payer: Medicaid Other | Admitting: Adult Health

## 2021-07-07 VITALS — BP 112/75 | HR 68 | Ht 62.0 in | Wt 208.0 lb

## 2021-07-07 DIAGNOSIS — Z3202 Encounter for pregnancy test, result negative: Secondary | ICD-10-CM | POA: Diagnosis not present

## 2021-07-07 DIAGNOSIS — Z8759 Personal history of other complications of pregnancy, childbirth and the puerperium: Secondary | ICD-10-CM | POA: Insufficient documentation

## 2021-07-07 LAB — POCT URINE PREGNANCY: Preg Test, Ur: NEGATIVE

## 2021-07-07 NOTE — Progress Notes (Signed)
?  Subjective:  ?  ? Patient ID: Jody Hansen, female   DOB: Feb 27, 1996, 26 y.o.   MRN: [id number] ? ?HPI ?Jody Hansen is a 26 year old white female, with SO, [id number] sp recent miscarriage in March,was seen in ER and [facility] was 26, she is here for follow up. ?Her blood type is A+.  ?She works for [employer].  ?PCP is Dr Sallee Lange. ? ?Lab Results  ?Component Value Date  ? DIAGPAP  04/13/2021  ?  - Negative for intraepithelial lesion or malignancy (NILM)  ? [place] Negative 04/13/2021  ?  ?Review of Systems ?Denies any pain or bleeding ?Reviewed past medical,surgical, social and family history. Reviewed medications and allergies.  ?   ?Objective:  ? Physical Exam ?BP 112/75 (BP Location: Left Arm, Patient Position: Sitting, Cuff Size: Large)   Pulse 68   Ht '5\' 2"'$  (1.575 m)   Wt 208 lb (94.3 kg)   Breastfeeding No   BMI 38.04 kg/m?  UPT is negative  ?Skin warm and dry.Lungs: clear to ausculation bilaterally. Cardiovascular: regular rate and rhythm.  ?Pelvic is deferred. ?  Fall risk is low ? [place] - 07/07/21 1132   ? ?  ? Pregnancy Intention Screening  ? Does the patient want to become pregnant in the next year? Ok Either Way   ? Does the patient's partner want to become pregnant in the next year? Ok Either Way   ? Would the patient like to discuss contraceptive options today? No   ?  ? Contraception Wrap Up  ? Current Method Withdrawal or Other Method   ? End Method Withdrawal or Other Method   ? ?  ?  ? ?  ?  ?Assessment:  ?   ?1. Pregnancy examination or test, negative result ? ?2. History of miscarriage ?Take OTC PNV ?Discussed timing of sex if wants pregnancy soon, and let me when if gets =HPT, will check QHCG and progesterone level early ?   ?Plan:  ?   ?Follow up prn  ?   ?

## 2021-07-19 ENCOUNTER — Other Ambulatory Visit: Payer: Medicaid Other

## 2021-07-19 ENCOUNTER — Other Ambulatory Visit: Payer: Self-pay | Admitting: Family Medicine

## 2021-07-19 ENCOUNTER — Encounter: Payer: Self-pay | Admitting: Obstetrics & Gynecology

## 2021-07-19 DIAGNOSIS — Z3200 Encounter for pregnancy test, result unknown: Secondary | ICD-10-CM

## 2021-07-20 ENCOUNTER — Other Ambulatory Visit: Payer: Self-pay | Admitting: Adult Health

## 2021-07-20 DIAGNOSIS — Z3202 Encounter for pregnancy test, result negative: Secondary | ICD-10-CM

## 2021-07-20 LAB — BETA HCG QUANT (REF LAB): hCG Quant: 2 m[IU]/mL

## 2021-07-20 NOTE — Progress Notes (Signed)
Ck QHCG in am  

## 2021-07-21 DIAGNOSIS — Z3202 Encounter for pregnancy test, result negative: Secondary | ICD-10-CM | POA: Diagnosis not present

## 2021-07-22 ENCOUNTER — Encounter: Payer: Self-pay | Admitting: *Deleted

## 2021-07-22 LAB — BETA HCG QUANT (REF LAB): hCG Quant: <1 m[IU]/mL

## 2021-08-11 ENCOUNTER — Ambulatory Visit (HOSPITAL_COMMUNITY): Payer: Medicaid Other | Admitting: Psychiatry

## 2021-09-01 ENCOUNTER — Ambulatory Visit (HOSPITAL_COMMUNITY): Payer: Medicaid Other | Admitting: Psychiatry

## 2021-09-16 ENCOUNTER — Ambulatory Visit: Payer: Medicaid Other | Admitting: Adult Health

## 2021-09-22 ENCOUNTER — Ambulatory Visit: Payer: Medicaid Other | Admitting: Adult Health

## 2021-10-11 DIAGNOSIS — N911 Secondary amenorrhea: Secondary | ICD-10-CM | POA: Diagnosis not present

## 2021-10-11 DIAGNOSIS — M722 Plantar fascial fibromatosis: Secondary | ICD-10-CM | POA: Diagnosis not present

## 2021-10-20 ENCOUNTER — Encounter: Payer: Self-pay | Admitting: Family Medicine

## 2021-10-21 ENCOUNTER — Encounter: Payer: Self-pay | Admitting: Family Medicine

## 2021-10-21 NOTE — Telephone Encounter (Signed)
Please give Jody Hansen a work note through Sunday to be with her child after his circumcision thank you

## 2021-10-27 ENCOUNTER — Ambulatory Visit
Admission: EM | Admit: 2021-10-27 | Discharge: 2021-10-27 | Disposition: A | Discharge status: Home / Self Care | Payer: Medicaid Other | Attending: Nurse Practitioner | Admitting: Nurse Practitioner

## 2021-10-27 ENCOUNTER — Other Ambulatory Visit: Payer: Self-pay

## 2021-10-27 ENCOUNTER — Encounter: Payer: Self-pay | Admitting: Emergency Medicine

## 2021-10-27 DIAGNOSIS — N760 Acute vaginitis: Secondary | ICD-10-CM

## 2021-10-27 LAB — POCT URINALYSIS DIP (MANUAL ENTRY)
Bilirubin, UA: NEGATIVE
Blood, UA: NEGATIVE
Glucose, UA: NEGATIVE mg/dL
Ketones, POC UA: NEGATIVE mg/dL
Leukocytes, UA: NEGATIVE
Nitrite, UA: NEGATIVE
Protein Ur, POC: NEGATIVE mg/dL
Spec Grav, UA: 1.025 (ref 1.010–1.025)
Urobilinogen, UA: 0.2 E.U./dL
pH, UA: 7 (ref 5.0–8.0)

## 2021-10-27 MED ORDER — METRONIDAZOLE 500 MG PO TABS: 500.0000 mg | ORAL_TABLET | Freq: Two times a day (BID) | ORAL | 0 refills | Status: AC

## 2021-10-27 NOTE — ED Provider Notes (Signed)
[facility]    CSN: [id number] Arrival date & time: 10/27/21  1707      History   Chief Complaint Chief Complaint  Patient presents with   Vaginal Itching    UTI - Entered by patient    HPI Jody Hansen is a 26 y.o. female.   Patient presents for vaginal discharge that began while she was on her period.  Reports initially, it was white and thin in color.  She also had irritation on the outside of her vagina that has now resolved.  She denies any dysuria, urinary frequency or urgency, no urinary incontinence, abdominal pain, fever, nausea/vomiting, open sores on her genitalia, rashes, lesions, swelling in her groin.  No new back pain, blood in her urine.  She does endorse a fishy vaginal odor. She has taken Azo for her symptoms which has not helped very much.  Patient reports a history of bacterial vaginosis a few months ago.  Reports she took the entire treatment.    Past Medical History:  Diagnosis Date   Kidney stones 03/08/2015   Miscarriage    Pregnant 01/23/2015   Scoliosis     Patient Active Problem List   Diagnosis Date Noted   History of miscarriage 07/07/2021   Pregnancy examination or test, negative result 07/07/2021   Chronic midline low back pain 06/23/2021   Generalized anxiety disorder 08/07/2020   Severe episode of recurrent major depressive disorder, without psychotic features ([place]) 08/07/2020   History of shoulder dystocia in prior pregnancy 09/10/2019   Postpartum depression 08/28/2019   Migraines with aura 06/02/2016   Ganglion cyst 73/42/8768   Renal colic on right side 11/57/2620    Past Surgical History:  Procedure Laterality Date   NO PAST SURGERIES      OB History     Gravida  4   Para  2   Term  2   Preterm      AB  2   Living  2      SAB  2   IAB      Ectopic      Multiple  0   Live Births  2            Home Medications    Prior to Admission medications   Medication Sig Start Date End  Date Taking? Authorizing Provider  meloxicam (MOBIC) 15 MG tablet TAKE 1 TABLET(15 MG) BY MOUTH DAILY AS NEEDED FOR PAIN 07/19/21  Yes Cook, Jayce G, DO  metroNIDAZOLE (FLAGYL) 500 MG tablet Take 1 tablet (500 mg total) by mouth 2 (two) times daily for 7 days. 10/27/21 11/03/21 Yes Eulogio Bear, NP    Family History Family History  Problem Relation Age of Onset   Diabetes Paternal Grandfather    Diabetes Paternal Grandmother    Cancer Maternal Grandmother    Cancer Maternal Grandfather    Diabetes Father    Diabetes Mother    Diabetes Sister        borderline   Lung disease Paternal Aunt     Social History Social History   Tobacco Use   Smoking status: Never   Smokeless tobacco: Never  Vaping Use   Vaping Use: Never used  Substance Use Topics   Alcohol use: No   Drug use: No     Allergies   Depo-provera [medroxyprogesterone acetate]   Review of Systems Review of Systems Per HPI  Physical Exam Triage Vital Signs ED Triage Vitals  Enc Vitals Group  BP 10/27/21 1732 130/86     Pulse Rate 10/27/21 1732 85     Resp 10/27/21 1732 20     Temp 10/27/21 1732 99 F (37.2 C)     Temp Source 10/27/21 1732 Oral     SpO2 10/27/21 1732 96 %     Weight --      Height --      Head Circumference --      Peak Flow --      Pain Score 10/27/21 1733 0     Pain Loc --      Pain Edu? --      Excl. in [employer]? --    No data found.  Updated Vital Signs BP 130/86 (BP Location: Right Arm)   Pulse 85   Temp 99 F (37.2 C) (Oral)   Resp 20   LMP 10/15/2021 (Approximate)   SpO2 96%   Visual Acuity Right Eye Distance:   Left Eye Distance:   Bilateral Distance:    Right Eye Near:   Left Eye Near:    Bilateral Near:     Physical Exam Vitals and nursing note reviewed.  Constitutional:      General: She is not in acute distress.    Appearance: Normal appearance. She is not toxic-appearing.  Pulmonary:     Effort: Pulmonary effort is normal. No respiratory  distress.  Genitourinary:    Comments: Deferred-self swab obtained Skin:    General: Skin is warm and dry.     Coloration: Skin is not jaundiced or pale.     Findings: No erythema.  Neurological:     Mental Status: She is alert and oriented to person, place, and time.     Motor: No weakness.     Gait: Gait normal.  Psychiatric:        Mood and Affect: Mood normal.        Behavior: Behavior is cooperative.      UC Treatments / Results  Labs (all labs ordered are listed, but only abnormal results are displayed) Labs Reviewed  POCT URINALYSIS DIP (MANUAL ENTRY)  CERVICOVAGINAL ANCILLARY ONLY    EKG   Radiology No results found.  Procedures Procedures (including critical care time)  Medications Ordered in UC Medications - No data to display  Initial Impression / Assessment and Plan / UC Course  I have reviewed the triage vital signs and the nursing notes.  Pertinent labs & imaging results that were available during my care of the patient were reviewed by me and considered in my medical decision making (see chart for details).    Patient is a very pleasant, well-appearing 26 year old female presenting for vaginal irritation.  In triage, vital signs are stable; she is normotensive, afebrile, not tachycardic, not tachypneic, and oxygenating well on room air.  Urinalysis is negative for any signs of acute infection today.  We discussed that her symptoms are consistent with acute vaginitis, likely secondary to either bacterial vaginosis or yeast infection.  Symptoms sound more consistent with bacterial vaginosis, will treat empirically with Flagyl 500 mg twice daily for 7 days while vaginal swab is pending.  Vaginal hygiene discussed.  Patient declines HIV and syphilis testing, is not concerned for STI today.  The patient was given the opportunity to ask questions.  All questions answered to their satisfaction.  The patient is in agreement to this plan.   Final Clinical  Impressions(s) / UC Diagnoses   Final diagnoses:  Acute vaginitis     Discharge  Instructions      - Urinalysis today is negative for signs of infection -Your symptoms are consistent with either a yeast infection or bacterial vaginosis.  We will send off the vaginal swab that you did today and call you if it is not bacterial vaginosis.  In the meantime, please go on the treatment for bacterial vaginosis which is Flagyl 500 mg twice daily for 7 days. -Please refrain from douching, using soap in or around your vagina.  Clean her vagina with warm water only.    ED Prescriptions     Medication Sig Dispense Auth. Provider   metroNIDAZOLE (FLAGYL) 500 MG tablet Take 1 tablet (500 mg total) by mouth 2 (two) times daily for 7 days. 14 tablet Eulogio Bear, NP      PDMP not reviewed this encounter.   Eulogio Bear, NP 10/27/21 [id number]

## 2021-10-27 NOTE — Discharge Instructions (Addendum)
-   Urinalysis today is negative for signs of infection -Your symptoms are consistent with either a yeast infection or bacterial vaginosis.  We will send off the vaginal swab that you did today and call you if it is not bacterial vaginosis.  In the meantime, please go on the treatment for bacterial vaginosis which is Flagyl 500 mg twice daily for 7 days. -Please refrain from douching, using soap in or around your vagina.  Clean her vagina with warm water only.

## 2021-10-27 NOTE — ED Triage Notes (Addendum)
Pt reports vaginal itching initially and reports noticed a change in discharge during period. Pt reports took home UTI test and reports was positive for leukocytes but negative for nitrites. Has taken AZO x2.

## 2021-10-28 ENCOUNTER — Ambulatory Visit: Payer: Medicaid Other | Admitting: Podiatry

## 2021-10-28 ENCOUNTER — Encounter: Payer: Self-pay | Admitting: Podiatry

## 2021-10-28 ENCOUNTER — Encounter (HOSPITAL_COMMUNITY): Payer: Self-pay | Admitting: Emergency Medicine

## 2021-10-28 ENCOUNTER — Ambulatory Visit (HOSPITAL_COMMUNITY)
Admission: EM | Admit: 2021-10-28 | Discharge: 2021-10-28 | Disposition: A | Discharge status: Home / Self Care | Payer: Medicaid Other | Attending: Physician Assistant | Admitting: Physician Assistant

## 2021-10-28 ENCOUNTER — Ambulatory Visit: Payer: Medicaid Other

## 2021-10-28 DIAGNOSIS — M7661 Achilles tendinitis, right leg: Secondary | ICD-10-CM

## 2021-10-28 DIAGNOSIS — M7750 Other enthesopathy of unspecified foot: Secondary | ICD-10-CM

## 2021-10-28 DIAGNOSIS — J069 Acute upper respiratory infection, unspecified: Secondary | ICD-10-CM | POA: Diagnosis not present

## 2021-10-28 DIAGNOSIS — Z20822 Contact with and (suspected) exposure to covid-19: Secondary | ICD-10-CM | POA: Insufficient documentation

## 2021-10-28 DIAGNOSIS — R5383 Other fatigue: Secondary | ICD-10-CM

## 2021-10-28 DIAGNOSIS — G4489 Other headache syndrome: Secondary | ICD-10-CM | POA: Diagnosis not present

## 2021-10-28 DIAGNOSIS — R6883 Chills (without fever): Secondary | ICD-10-CM | POA: Diagnosis not present

## 2021-10-28 DIAGNOSIS — M21862 Other specified acquired deformities of left lower leg: Secondary | ICD-10-CM

## 2021-10-28 DIAGNOSIS — M21861 Other specified acquired deformities of right lower leg: Secondary | ICD-10-CM

## 2021-10-28 DIAGNOSIS — G9331 Postviral fatigue syndrome: Secondary | ICD-10-CM | POA: Diagnosis not present

## 2021-10-28 LAB — CERVICOVAGINAL ANCILLARY ONLY
Bacterial Vaginitis (gardnerella): POSITIVE — AB
Candida Glabrata: NEGATIVE
Candida Vaginitis: NEGATIVE
Comment: NEGATIVE
Comment: NEGATIVE
Comment: NEGATIVE

## 2021-10-28 MED ORDER — METHYLPREDNISOLONE 4 MG PO TBPK: ORAL_TABLET | ORAL | 0 refills | Status: DC

## 2021-10-28 MED ORDER — PSEUDOEPHEDRINE HCL 30 MG PO TABS: 30.0000 mg | ORAL_TABLET | Freq: Three times a day (TID) | ORAL | 0 refills | Status: DC | PRN

## 2021-10-28 MED ORDER — MELOXICAM 15 MG PO TABS: 15.0000 mg | ORAL_TABLET | Freq: Every day | ORAL | 3 refills | Status: DC

## 2021-10-28 NOTE — ED Triage Notes (Addendum)
Patient c/o chills that started this morning.   Patient denies fever.   Patient endorses fatigue. Patient endorses headache.   Patient endorses throat irritation. Patient denies painful swallowing.   Patient endorses nasal congestion.   Patient endorses taking at home COVID test and possible " seeing a faint line".   Patient has taken Dayquil today with no relief of symptoms.

## 2021-10-28 NOTE — Discharge Instructions (Signed)
Advised to take the Sudafed every 8 hours to help decrease the nasal and sinus congestion. Advised take Tylenol and ibuprofen if needed for fever and body aches. Advised to increase fluid intake over the next couple days. COVID test should be completed within the next 24 hours.  If you do not hear from our office this indicates that the test is negative or normal.  You can check the results on MyChart when they post in the next 24 hours or less. Follow-up with PCP or return to urgent care if symptoms fail to improve.

## 2021-10-28 NOTE — ED Provider Notes (Signed)
[facility]    CSN: [id number] Arrival date & time: 10/28/21  3009      History   Chief Complaint Chief Complaint  Patient presents with   Chills    HPI Jody Hansen is a 26 y.o. female.   26 year old female presents with sinus congestion and headache.  Patient indicates that she just returned from [facility] and on the drive back she started having some upper respiratory congestion, headache which was mainly frontal lasting for several hours, rhinitis with clear production.  She indicates that last night she had some nausea and stomach cramping without vomiting, no diarrhea.  She indicates that she has had some fatigue and chills which has been intermittent.  She did a COVID test at 3 AM this morning which she believes was faintly positive.  Patient indicates that she has not been around any family or friends with COVID or strep that she knows of.  Patient did take some DayQuil which has not helped her congestion.  Patient denies fever.     Past Medical History:  Diagnosis Date   Kidney stones 03/08/2015   Miscarriage    Pregnant 01/23/2015   Scoliosis     Patient Active Problem List   Diagnosis Date Noted   History of miscarriage 07/07/2021   Pregnancy examination or test, negative result 07/07/2021   Chronic midline low back pain 06/23/2021   Generalized anxiety disorder 08/07/2020   Severe episode of recurrent major depressive disorder, without psychotic features ([place]) 08/07/2020   History of shoulder dystocia in prior pregnancy 09/10/2019   Postpartum depression 08/28/2019   Migraines with aura 06/02/2016   Ganglion cyst 23/30/0762   Renal colic on right side 26/33/3545    Past Surgical History:  Procedure Laterality Date   NO PAST SURGERIES      OB History     Gravida  4   Para  2   Term  2   Preterm      AB  2   Living  2      SAB  2   IAB      Ectopic      Multiple  0   Live Births  2            Home  Medications    Prior to Admission medications   Medication Sig Start Date End Date Taking? Authorizing Provider  metroNIDAZOLE (FLAGYL) 500 MG tablet Take 1 tablet (500 mg total) by mouth 2 (two) times daily for 7 days. 10/27/21 11/03/21 Yes Eulogio Bear, NP  pseudoephedrine (SUDAFED) 30 MG tablet Take 1 tablet (30 mg total) by mouth every 8 (eight) hours as needed for congestion. For sinus and nasal congestion. 10/28/21  Yes Nyoka Lint, PA-C  meloxicam (MOBIC) 15 MG tablet Take 1 tablet (15 mg total) by mouth daily. 10/28/21   Criselda Peaches, DPM  methylPREDNISolone (MEDROL DOSEPAK) 4 MG TBPK tablet 6 day dose pack - take as directed 10/28/21   Criselda Peaches, DPM    Family History Family History  Problem Relation Age of Onset   Diabetes Paternal Grandfather    Diabetes Paternal Grandmother    Cancer Maternal Grandmother    Cancer Maternal Grandfather    Diabetes Father    Diabetes Mother    Diabetes Sister        borderline   Lung disease Paternal Aunt     Social History Social History   Tobacco Use   Smoking status: Never  Smokeless tobacco: Never  Vaping Use   Vaping Use: Never used  Substance Use Topics   Alcohol use: No   Drug use: No     Allergies   Depo-provera [medroxyprogesterone acetate]   Review of Systems Review of Systems  Constitutional:  Positive for chills and fatigue.  HENT:  Positive for rhinorrhea and sinus pressure.   Neurological:  Positive for headaches.     Physical Exam Triage Vital Signs ED Triage Vitals  Enc Vitals Group     BP 10/28/21 1001 103/70     Pulse Rate 10/28/21 1001 79     Resp 10/28/21 1001 16     Temp 10/28/21 1001 98.1 F (36.7 C)     Temp Source 10/28/21 1001 Oral     SpO2 10/28/21 1001 98 %     Weight --      Height --      Head Circumference --      Peak Flow --      Pain Score 10/28/21 1006 2     Pain Loc --      Pain Edu? --      Excl. in [place]? --    No data found.  Updated Vital Signs BP  103/70 (BP Location: Right Arm)   Pulse 79   Temp 98.1 F (36.7 C) (Oral)   Resp 16   LMP 10/15/2021 (Approximate)   SpO2 98%   Visual Acuity Right Eye Distance:   Left Eye Distance:   Bilateral Distance:    Right Eye Near:   Left Eye Near:    Bilateral Near:     Physical Exam Constitutional:      Appearance: Normal appearance.  HENT:     Mouth/Throat:     Mouth: Mucous membranes are moist.     Pharynx: Oropharynx is clear. No pharyngeal swelling or posterior oropharyngeal erythema.  Cardiovascular:     Rate and Rhythm: Normal rate and regular rhythm.     Heart sounds: Normal heart sounds.  Pulmonary:     Effort: Pulmonary effort is normal.     Breath sounds: Normal breath sounds and air entry. No wheezing, rhonchi or rales.  Lymphadenopathy:     Cervical: No cervical adenopathy.  Neurological:     Mental Status: She is alert.      UC Treatments / Results  Labs (all labs ordered are listed, but only abnormal results are displayed) Labs Reviewed  SARS CORONAVIRUS 2 (TAT 6-24 HRS)    EKG   Radiology No results found.  Procedures Procedures (including critical care time)  Medications Ordered in UC Medications - No data to display  Initial Impression / Assessment and Plan / UC Course  I have reviewed the triage vital signs and the nursing notes.  Pertinent labs & imaging results that were available during my care of the patient were reviewed by me and considered in my medical decision making (see chart for details).    Plan: 1.  COVID test is pending 2.  Advised to take Sudafed every 8 hours to help relieve the sinus and nasal congestion. 3.  Advised take ibuprofen or Tylenol as needed for body aches and fever. 4.  Advised to follow-up PCP or return to urgent care if symptoms fail to improve. Final Clinical Impressions(s) / UC Diagnoses   Final diagnoses:  Viral upper respiratory tract infection  Chills  Postviral fatigue syndrome  Other headache  syndrome     Discharge Instructions      Advised  to take the Sudafed every 8 hours to help decrease the nasal and sinus congestion. Advised take Tylenol and ibuprofen if needed for fever and body aches. Advised to increase fluid intake over the next couple days. COVID test should be completed within the next 24 hours.  If you do not hear from our office this indicates that the test is negative or normal.  You can check the results on MyChart when they post in the next 24 hours or less. Follow-up with PCP or return to urgent care if symptoms fail to improve.    ED Prescriptions     Medication Sig Dispense Auth. Provider   pseudoephedrine (SUDAFED) 30 MG tablet Take 1 tablet (30 mg total) by mouth every 8 (eight) hours as needed for congestion. For sinus and nasal congestion. 30 tablet Nyoka Lint, PA-C      PDMP not reviewed this encounter.   Nyoka Lint, PA-C 10/28/21 1026

## 2021-10-28 NOTE — Progress Notes (Signed)
  Subjective:  Patient ID: Jody Hansen, female    DOB: 1995-05-27,  MRN: [id number]  Chief Complaint  Patient presents with   Foot Pain    : Right plantar fascitis Referring Provider: Lucky Cowboy LEE - both heels are painful but the right is worse -had xrays a few weeks ago    26 y.o. female presents with the above complaint. History confirmed with patient.   Objective:  Physical Exam: warm, good capillary refill, no trophic changes or ulcerative lesions, normal DP and PT pulses, normal sensory exam Left Foot: tenderness at Achilles tendon insertion and gastrocnemius equinus is noted with a positive silverskiold test Right Foot: gastrocnemius equinus is noted with a positive silverskiold test  No images are attached to the encounter.  Radiographs: Multiple views x-ray of the left foot: Slight-deformity, no posterior calcaneal enthesophyte is noted Assessment:   1. Tendonitis, Achilles, right   2. Gastrocnemius equinus of left lower extremity   3. Gastrocnemius equinus of right lower extremity      Plan:  Patient was evaluated and treated and all questions answered.  Discussed the etiology and treatment options for Achilles tendinitis including stretching, formal physical therapy with an eccentric exercises therapy plan, supportive shoegears such as a running shoe or sneaker, heel lifts, topical and oral medications.  We also discussed that I do not routinely perform injections in this area because of the risk of an increased damage or rupture of the tendon.  We also discussed the role of surgical treatment of this for patients who do not improve after exhausting non-surgical treatment options.  -XR reviewed with patient -Educated on stretching and icing of the affected limb. -Night splint prescription was given to obtain at [facility] clinic, I discussed with her if they do not have these there that she may get this on [place] and demonstrated this to her today. -Referral  placed to physical therapy. -Rx for Medrol and Meloxicam. Advised to start Meloxicam the day after completion of oral steroid taper.   Return in about 2 months (around 12/28/2021).

## 2021-10-28 NOTE — Patient Instructions (Signed)
Call to schedule physical therapy: [facility] at [place] [address]  [phone]  

## 2021-10-29 LAB — SARS CORONAVIRUS 2 (TAT 6-24 HRS): SARS Coronavirus 2: NEGATIVE

## 2021-11-04 ENCOUNTER — Emergency Department (HOSPITAL_COMMUNITY)
Admission: EM | Admit: 2021-11-04 | Discharge: 2021-11-04 | Discharge status: Against Medical Advice | Payer: Medicaid Other

## 2021-11-04 ENCOUNTER — Ambulatory Visit: Admit: 2021-11-04 | Discharge status: Absent without leave | Payer: Medicaid Other

## 2021-11-05 DIAGNOSIS — Z113 Encounter for screening for infections with a predominantly sexual mode of transmission: Secondary | ICD-10-CM | POA: Diagnosis not present

## 2021-11-05 DIAGNOSIS — J029 Acute pharyngitis, unspecified: Secondary | ICD-10-CM | POA: Diagnosis not present

## 2021-11-09 ENCOUNTER — Other Ambulatory Visit: Payer: Medicaid Other

## 2021-11-16 ENCOUNTER — Ambulatory Visit: Payer: Medicaid Other | Admitting: Physical Therapy

## 2021-11-18 ENCOUNTER — Ambulatory Visit (HOSPITAL_COMMUNITY)
Admission: EM | Admit: 2021-11-18 | Discharge: 2021-11-18 | Disposition: A | Discharge status: Home / Self Care | Payer: Medicaid Other | Attending: Family Medicine | Admitting: Family Medicine

## 2021-11-18 ENCOUNTER — Encounter (HOSPITAL_COMMUNITY): Payer: Self-pay

## 2021-11-18 DIAGNOSIS — J029 Acute pharyngitis, unspecified: Secondary | ICD-10-CM

## 2021-11-18 DIAGNOSIS — R051 Acute cough: Secondary | ICD-10-CM | POA: Diagnosis not present

## 2021-11-18 LAB — POCT INFECTIOUS MONO SCREEN, ED / UC: Mono Screen: NEGATIVE

## 2021-11-18 MED ORDER — PANTOPRAZOLE SODIUM 20 MG PO TBEC: 20.0000 mg | DELAYED_RELEASE_TABLET | Freq: Every day | ORAL | 0 refills | Status: DC

## 2021-11-18 NOTE — ED Provider Notes (Signed)
[facility]   [id number] 11/18/21 Arrival Time: 1119  ASSESSMENT & PLAN:  1. Sore throat   2. Acute cough    Would expect viral symptoms to have resolved by now. No signs of bacterial infection. Ques reflux related; discussed. Begin trial of:  Discharge Medication List as of 11/18/2021  1:24 PM     START taking these medications   Details  pantoprazole (PROTONIX) 20 MG tablet Take 1 tablet (20 mg total) by mouth daily., Starting Thu 11/18/2021, Normal       Labs Reviewed  POCT INFECTIOUS MONO SCREEN, ED / UC  Negative.    Follow-up Information     Luking, Elayne Snare, MD.   Specialty: Family Medicine Why: If worsening or failing to improve as anticipated. Contact information: [place] [address] [phone]                 Reviewed expectations re: course of current medical issues. Questions answered. Outlined signs and symptoms indicating need for more acute intervention. Understanding verbalized. After Visit Summary given.   SUBJECTIVE: History from: Patient. Jody Hansen is a 26 y.o. female. Pt was seen at [facility] urgent care on 10/28/2021 for cough, runny nose, nasal congestion, nasal drainage sore throat. Pt states she received antibiotic but was unable to finish the regiment. Pt states she still having symptoms. Painful swallowing at times  Is coughing. No SOB/wheezing. Afebrile.  OBJECTIVE:  Vitals:   11/18/21 1220 11/18/21 1222  BP:  106/72  Pulse:  85  Resp:  12  Temp:  98.2 F (36.8 C)  SpO2:  98%  Weight: 94.3 kg   Height: '5\' 1"'$  (1.549 m)     General appearance: alert; no distress Eyes: PERRLA; EOMI; conjunctiva normal HENT: [place]; AT; without nasal congestion; throat with mild irritation; normal tonsils Neck: supple s LAD Lungs: speaks full sentences without difficulty; unlabored Extremities: no edema Skin: warm and dry Neurologic: normal gait Psychological: alert and cooperative; normal mood and  affect  Labs: Results for orders placed or performed during the hospital encounter of 11/18/21  POCT Infectious Mono Screen  Result Value Ref Range   Mono Screen NEGATIVE NEGATIVE   Labs Reviewed  POCT INFECTIOUS MONO SCREEN, ED / UC    Allergies  Allergen Reactions   Depo-Provera [Medroxyprogesterone Acetate] Other (See Comments)    Weight gain; bruising; headaches    Past Medical History:  Diagnosis Date   Kidney stones 03/08/2015   Miscarriage    Pregnant 01/23/2015   Scoliosis    Social History   Socioeconomic History   Marital status: Significant Other    Spouse name: Not on file   Number of children: 1   Years of education: Not on file   Highest education level: High school graduate  Occupational History   Occupation: call center  Tobacco Use   Smoking status: Never   Smokeless tobacco: Never  Vaping Use   Vaping Use: Never used  Substance and Sexual Activity   Alcohol use: No   Drug use: No   Sexual activity: Yes    Birth control/protection: None  Other Topics Concern   Not on file  Social History Narrative   Not on file   Social Determinants of Health   Financial Resource Strain: Low Risk  (02/11/2019)   Overall Financial Resource Strain (CARDIA)    Difficulty of Paying Living Expenses: Not hard at all  Food Insecurity: No Food Insecurity (02/11/2019)   Hunger Vital Sign  Worried About Charity fundraiser in the Last Year: Never true    [place] in the Last Year: Never true  Transportation Needs: No Transportation Needs (02/11/2019)   PRAPARE - Hydrologist (Medical): No    Lack of Transportation (Non-Medical): No  Physical Activity: Inactive (02/11/2019)   Exercise Vital Sign    Days of Exercise per Week: 0 days    Minutes of Exercise per Session: 0 min  Stress: No Stress Concern Present (02/11/2019)   [place]    Feeling of Stress : Not at  all  Social Connections: Moderately Isolated (02/11/2019)   Social Connection and Isolation Panel [NHANES]    Frequency of Communication with Friends and Family: More than three times a week    Frequency of Social Gatherings with Friends and Family: Twice a week    Attends Religious Services: Never    Marine scientist or Organizations: No    Attends Archivist Meetings: Never    Marital Status: Living with partner  Intimate Partner Violence: Not At Risk (02/11/2019)   Humiliation, Afraid, Rape, and Kick questionnaire    Fear of Current or Ex-Partner: No    Emotionally Abused: No    Physically Abused: No    Sexually Abused: No   Family History  Problem Relation Age of Onset   Diabetes Paternal Grandfather    Diabetes Paternal Grandmother    Cancer Maternal Grandmother    Cancer Maternal Grandfather    Diabetes Father    Diabetes Mother    Diabetes Sister        borderline   Lung disease Paternal Aunt    Past Surgical History:  Procedure Laterality Date   NO PAST SURGERIES       Vanessa Kick, MD 11/18/21 1341

## 2021-11-18 NOTE — ED Triage Notes (Signed)
Pt was seen at [facility] urgent care on 10/28/2021 for cough, runny nose, nasal congestion, nasal drainage sore throat . Pt states she received antibiotic but was unable to finish the regiment . Pt states she still having symptoms. Painful swallowing at times

## 2021-11-19 ENCOUNTER — Other Ambulatory Visit (INDEPENDENT_AMBULATORY_CARE_PROVIDER_SITE_OTHER): Payer: Medicaid Other | Admitting: *Deleted

## 2021-11-19 ENCOUNTER — Other Ambulatory Visit (HOSPITAL_COMMUNITY)
Admission: RE | Admit: 2021-11-19 | Discharge: 2021-11-19 | Disposition: A | Discharge status: Home / Self Care | Payer: Medicaid Other | Source: Ambulatory Visit | Attending: Obstetrics & Gynecology | Admitting: Obstetrics & Gynecology

## 2021-11-19 DIAGNOSIS — N898 Other specified noninflammatory disorders of vagina: Secondary | ICD-10-CM | POA: Diagnosis not present

## 2021-11-19 NOTE — Progress Notes (Signed)
   NURSE VISIT- VAGINITIS/STD/POC  SUBJECTIVE:  Jody Hansen is a 26 y.o. [id number] GYN patientfemale here for a vaginal swab for vaginitis screening, STD screen.  She reports the following symptoms: local irritation for several  days. Denies abnormal vaginal bleeding, significant pelvic pain, fever, or UTI symptoms.  OBJECTIVE:  LMP 11/11/2021 (Approximate)   Appears well, in no apparent distress  ASSESSMENT: Vaginal swab for vaginitis screening  PLAN: Self-collected vaginal probe for Gonorrhea, Chlamydia, Trichomonas, Bacterial Vaginosis, Yeast sent to lab Also sent urine for culture.  Treatment: to be determined once results are received Follow-up as needed if symptoms persist/worsen, or new symptoms develop  Janece Canterbury  11/19/2021 11:59 AM

## 2021-11-20 LAB — URINALYSIS
Bilirubin, UA: NEGATIVE
Glucose, UA: NEGATIVE
Ketones, UA: NEGATIVE
Nitrite, UA: NEGATIVE
Protein,UA: NEGATIVE
RBC, UA: NEGATIVE
Specific Gravity, UA: 1.022 (ref 1.005–1.030)
Urobilinogen, Ur: 0.2 mg/dL (ref 0.2–1.0)
pH, UA: 7 (ref 5.0–7.5)

## 2021-11-22 LAB — CERVICOVAGINAL ANCILLARY ONLY
Bacterial Vaginitis (gardnerella): NEGATIVE
Candida Glabrata: NEGATIVE
Candida Vaginitis: NEGATIVE
Chlamydia: NEGATIVE
Comment: NEGATIVE
Comment: NEGATIVE
Comment: NEGATIVE
Comment: NEGATIVE
Comment: NEGATIVE
Comment: NORMAL
Neisseria Gonorrhea: NEGATIVE
Trichomonas: NEGATIVE

## 2021-11-22 LAB — URINE CULTURE

## 2021-11-25 ENCOUNTER — Ambulatory Visit: Payer: Medicaid Other | Admitting: Nurse Practitioner

## 2021-11-25 VITALS — BP 110/72 | HR 90 | Temp 98.1°F | Ht 61.0 in | Wt 210.0 lb

## 2021-11-25 DIAGNOSIS — Z30011 Encounter for initial prescription of contraceptive pills: Secondary | ICD-10-CM

## 2021-11-25 DIAGNOSIS — R051 Acute cough: Secondary | ICD-10-CM | POA: Diagnosis not present

## 2021-11-25 MED ORDER — LEVONORGEST-ETH ESTRAD 91-DAY 0.15-0.03 MG PO TABS: 1.0000 | ORAL_TABLET | Freq: Every day | ORAL | 3 refills | Status: DC

## 2021-11-25 MED ORDER — CETIRIZINE HCL 10 MG PO TABS
10.0000 mg | ORAL_TABLET | Freq: Every day | ORAL | 1 refills | Status: DC
Start: 2021-11-25 — End: 2022-03-14

## 2021-11-25 MED ORDER — LORATADINE 10 MG PO TABS: 10.0000 mg | ORAL_TABLET | Freq: Every day | ORAL | 1 refills | Status: DC

## 2021-11-25 MED ORDER — PREDNISONE 20 MG PO TABS: 20.0000 mg | ORAL_TABLET | Freq: Every day | ORAL | 0 refills | Status: DC

## 2021-11-25 NOTE — Progress Notes (Signed)
Subjective:    Patient ID: Jody Hansen, female    DOB: 11-24-1995, 26 y.o.   MRN: [id number]  HPI  Persistent cough mainly dry and post nasal congestion x1 month.  Patient states that she believes she has mold in her home but has not tested the area nor treated for mold yet.  Patient denies shortness of breath, fever, body aches, chills, wheezing.  Patient also requesting to get birth control.  Patient states that she had a miscarriage in April, May and 2.5 years ago.  Patient states that she was on a 66-monthbirth control pill in the past which she would like to continue.  Review of Systems  HENT:  Positive for congestion and postnasal drip.   All other systems reviewed and are negative.      Objective:   Physical Exam Vitals reviewed.  Constitutional:      General: She is not in acute distress.    Appearance: Normal appearance. She is normal weight. She is not ill-appearing, toxic-appearing or diaphoretic.  HENT:     Head: Normocephalic and atraumatic.     Right Ear: Tympanic membrane, ear canal and external ear normal. There is no impacted cerumen.     Left Ear: Tympanic membrane, ear canal and external ear normal. There is no impacted cerumen.     Nose: Nose normal. No congestion or rhinorrhea.     Mouth/Throat:     Mouth: Mucous membranes are moist.     Pharynx: Oropharynx is clear. No oropharyngeal exudate or posterior oropharyngeal erythema.  Eyes:     General: No scleral icterus.       Right eye: No discharge.        Left eye: No discharge.     Extraocular Movements: Extraocular movements intact.     Conjunctiva/sclera: Conjunctivae normal.     Pupils: Pupils are equal, round, and reactive to light.  Neck:     Vascular: No carotid bruit.  Cardiovascular:     Rate and Rhythm: Normal rate and regular rhythm.     Pulses: Normal pulses.     Heart sounds: Normal heart sounds. No murmur heard. Pulmonary:     Effort: Pulmonary effort is normal. No respiratory  distress.     Breath sounds: Normal breath sounds. No wheezing.  Musculoskeletal:     Cervical back: Normal range of motion and neck supple. No rigidity or tenderness.     Comments: Grossly intact  Lymphadenopathy:     Cervical: No cervical adenopathy.  Skin:    General: Skin is warm.     Capillary Refill: Capillary refill takes less than 2 seconds.  Neurological:     Mental Status: She is alert.     Comments: Grossly intact  Psychiatric:        Mood and Affect: Mood normal.        Behavior: Behavior normal.           Assessment & Plan:   1. Encounter for initial prescription of contraceptive pills - levonorgestrel-ethinyl estradiol (SEASONALE) 0.15-0.03 MG tablet; Take 1 tablet by mouth daily.  Dispense: 84 tablet; Refill: 3  2. Acute cough -Likely allergy related - predniSONE (DELTASONE) 20 MG tablet; Take 1 tablet (20 mg total) by mouth daily with breakfast for 5 days.  Dispense: 5 tablet; Refill: 0 - cetirizine (ZYRTEC) 10 MG tablet; Take 1 tablet (10 mg total) by mouth daily. Take 1 tablet daily at night  Dispense: 30 tablet; Refill: 1 - loratadine (CLARITIN) 10  MG tablet; Take 1 tablet (10 mg total) by mouth daily. Take 1 tablet daily in the mornings if you do not get relief from Zyrtec.  Dispense: 30 tablet; Refill: 1 -Recommend patient have mold tested and treated if necessary -Return to clinic if symptoms persist or if they worsen

## 2021-11-28 ENCOUNTER — Encounter: Payer: Self-pay | Admitting: Nurse Practitioner

## 2021-11-28 DIAGNOSIS — R0602 Shortness of breath: Secondary | ICD-10-CM | POA: Diagnosis not present

## 2021-11-28 DIAGNOSIS — Z1331 Encounter for screening for depression: Secondary | ICD-10-CM | POA: Diagnosis not present

## 2021-11-28 DIAGNOSIS — E8881 Metabolic syndrome: Secondary | ICD-10-CM | POA: Diagnosis not present

## 2021-11-28 DIAGNOSIS — R635 Abnormal weight gain: Secondary | ICD-10-CM | POA: Diagnosis not present

## 2021-11-28 DIAGNOSIS — F5081 Binge eating disorder: Secondary | ICD-10-CM | POA: Diagnosis not present

## 2021-11-28 DIAGNOSIS — Z131 Encounter for screening for diabetes mellitus: Secondary | ICD-10-CM | POA: Diagnosis not present

## 2021-11-28 DIAGNOSIS — Z79899 Other long term (current) drug therapy: Secondary | ICD-10-CM | POA: Diagnosis not present

## 2021-11-28 DIAGNOSIS — Z6841 Body Mass Index (BMI) 40.0 and over, adult: Secondary | ICD-10-CM | POA: Diagnosis not present

## 2021-11-28 DIAGNOSIS — E7800 Pure hypercholesterolemia, unspecified: Secondary | ICD-10-CM | POA: Diagnosis not present

## 2021-11-28 DIAGNOSIS — E559 Vitamin D deficiency, unspecified: Secondary | ICD-10-CM | POA: Diagnosis not present

## 2021-12-06 ENCOUNTER — Encounter: Payer: Self-pay | Admitting: Nurse Practitioner

## 2021-12-07 ENCOUNTER — Other Ambulatory Visit: Payer: Self-pay | Admitting: Nurse Practitioner

## 2021-12-07 ENCOUNTER — Ambulatory Visit (HOSPITAL_COMMUNITY)
Admission: RE | Admit: 2021-12-07 | Discharge: 2021-12-07 | Disposition: A | Discharge status: Home / Self Care | Payer: Medicaid Other | Source: Ambulatory Visit | Attending: Nurse Practitioner | Admitting: Nurse Practitioner

## 2021-12-07 DIAGNOSIS — R051 Acute cough: Secondary | ICD-10-CM | POA: Insufficient documentation

## 2021-12-07 DIAGNOSIS — R059 Cough, unspecified: Secondary | ICD-10-CM | POA: Diagnosis not present

## 2021-12-09 ENCOUNTER — Other Ambulatory Visit: Payer: Self-pay | Admitting: Nurse Practitioner

## 2021-12-09 ENCOUNTER — Ambulatory Visit
Admission: EM | Admit: 2021-12-09 | Discharge: 2021-12-09 | Disposition: A | Discharge status: Home / Self Care | Payer: Medicaid Other | Attending: Nurse Practitioner | Admitting: Nurse Practitioner

## 2021-12-09 ENCOUNTER — Encounter: Payer: Self-pay | Admitting: Emergency Medicine

## 2021-12-09 DIAGNOSIS — R051 Acute cough: Secondary | ICD-10-CM

## 2021-12-09 DIAGNOSIS — R0982 Postnasal drip: Secondary | ICD-10-CM

## 2021-12-09 MED ORDER — FLUTICASONE PROPIONATE 50 MCG/ACT NA SUSP: 1.0000 | Freq: Two times a day (BID) | NASAL | 0 refills | Status: DC

## 2021-12-09 MED ORDER — PROMETHAZINE-DM 6.25-15 MG/5ML PO SYRP: 5.0000 mL | ORAL_SOLUTION | Freq: Every evening | ORAL | 0 refills | Status: DC | PRN

## 2021-12-09 MED ORDER — PREDNISONE 20 MG PO TABS: 20.0000 mg | ORAL_TABLET | Freq: Every day | ORAL | 0 refills | Status: AC

## 2021-12-09 MED ORDER — BENZONATATE 100 MG PO CAPS: 100.0000 mg | ORAL_CAPSULE | Freq: Three times a day (TID) | ORAL | 0 refills | Status: DC | PRN

## 2021-12-09 MED ORDER — BENZONATATE 100 MG PO CAPS: 100.0000 mg | ORAL_CAPSULE | Freq: Two times a day (BID) | ORAL | 0 refills | Status: DC | PRN

## 2021-12-09 NOTE — ED Provider Notes (Signed)
[facility]    CSN: [id number] Arrival date & time: 12/09/21  1206      History   Chief Complaint No chief complaint on file.   HPI Jody Hansen is a 26 y.o. female.   Patient presents for more than 2 weeks of mostly dry cough that is occasionally productive.  She also endorses hoarseness pursing the morning.  She is having pain in her chest when she coughs as well as a little bit of chest congestion and postnasal drainage.  She also is endorsing ear pain and vomiting from coughing so much.  Is also been more tired than normal.  She denies fever, body, chills, shortness of breath or wheezing, nasal congestion, runny nose, sore throat, nausea, diarrhea, and decreased appetite.  Reports that she is coughing so much that she is unable to hold her bladder.  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  Reports initially, she was sick from her children who had a viral upper respiratory infection.  Reports she had a chest x-ray a couple of days ago that did not show pneumonia.  She has taken over-the-counter allergy medications as well as prednisone without much improvement.  She was unaware that cough Perles have been sent to the pharmacy and had not tried this yet.    Past Medical History:  Diagnosis Date   Kidney stones 03/08/2015   Miscarriage    Pregnant 01/23/2015   Scoliosis     Patient Active Problem List   Diagnosis Date Noted   History of miscarriage 07/07/2021   Pregnancy examination or test, negative result 07/07/2021   Chronic midline low back pain 06/23/2021   Generalized anxiety disorder 08/07/2020   Severe episode of recurrent major depressive disorder, without psychotic features ([place]) 08/07/2020   History of shoulder dystocia in prior pregnancy 09/10/2019   Postpartum depression 08/28/2019   Migraines with aura 06/02/2016   Ganglion cyst 02/58/5277   Renal colic on right side 82/42/3536    Past Surgical History:  Procedure  Laterality Date   NO PAST SURGERIES      OB History     Gravida  4   Para  2   Term  2   Preterm      AB  2   Living  2      SAB  2   IAB      Ectopic      Multiple  0   Live Births  2            Home Medications    Prior to Admission medications   Medication Sig Start Date End Date Taking? Authorizing Provider  fluticasone (FLONASE) 50 MCG/ACT nasal spray Place 1 spray into both nostrils 2 (two) times daily. 12/09/21  Yes Eulogio Bear, NP  promethazine-dextromethorphan (PROMETHAZINE-DM) 6.25-15 MG/5ML syrup Take 5 mLs by mouth at bedtime as needed for cough. Do not take with alcohol or while driving or operating heavy machinery.  May cause drowsiness. 12/09/21  Yes Eulogio Bear, NP  benzonatate (TESSALON) 100 MG capsule Take 1 capsule (100 mg total) by mouth 3 (three) times daily as needed for cough. Do not take with alcohol or while driving or operating heavy machinery .  May cause drowsiness. 12/09/21   Eulogio Bear, NP  cetirizine (ZYRTEC) 10 MG tablet Take 1 tablet (10 mg total) by mouth daily. Take 1 tablet daily at night 11/25/21   Ameduite, Trenton Gammon, NP  citalopram (CELEXA) 40 MG tablet Take 40 mg by mouth daily. Patient not taking: Reported on 11/25/2021    [provider]  hydrOXYzine (ATARAX) 10 MG tablet Take 10 mg by mouth 3 (three) times daily as needed. Patient not taking: Reported on 11/25/2021    [provider]  levonorgestrel-ethinyl estradiol (SEASONALE) 0.15-0.03 MG tablet Take 1 tablet by mouth daily. 11/25/21   Ameduite, Trenton Gammon, NP  loratadine (CLARITIN) 10 MG tablet Take 1 tablet (10 mg total) by mouth daily. Take 1 tablet daily in the mornings if you do not get relief from Zyrtec. 11/25/21   Ameduite, Trenton Gammon, NP  meloxicam (MOBIC) 15 MG tablet Take 1 tablet (15 mg total) by mouth daily. Patient not taking: Reported on 11/25/2021 10/28/21   Criselda Peaches, DPM  pantoprazole (PROTONIX) 20 MG tablet Take 1  tablet (20 mg total) by mouth daily. 11/18/21   Vanessa Kick, MD  predniSONE (DELTASONE) 20 MG tablet Take 1 tablet (20 mg total) by mouth daily with breakfast for 5 days. 12/09/21 12/14/21  Ameduite, Trenton Gammon, NP  pseudoephedrine (SUDAFED) 30 MG tablet Take 1 tablet (30 mg total) by mouth every 8 (eight) hours as needed for congestion. For sinus and nasal congestion. Patient not taking: Reported on 11/25/2021 10/28/21   Nyoka Lint, PA-C    Family History Family History  Problem Relation Age of Onset   Diabetes Paternal Grandfather    Diabetes Paternal Grandmother    Cancer Maternal Grandmother    Cancer Maternal Grandfather    Diabetes Father    Diabetes Mother    Diabetes Sister        borderline   Lung disease Paternal Aunt     Social History Social History   Tobacco Use   Smoking status: Never   Smokeless tobacco: Never  Vaping Use   Vaping Use: Never used  Substance Use Topics   Alcohol use: No   Drug use: No     Allergies   Depo-provera [medroxyprogesterone acetate]   Review of Systems Review of Systems Per HPI  Physical Exam Triage Vital Signs ED Triage Vitals  Enc Vitals Group     BP 12/09/21 1212 112/75     Pulse Rate 12/09/21 1212 90     Resp 12/09/21 1212 16     Temp 12/09/21 1212 98.4 F (36.9 C)     Temp Source 12/09/21 1212 Oral     SpO2 12/09/21 1212 99 %     Weight --      Height --      Head Circumference --      Peak Flow --      Pain Score 12/09/21 1214 0     Pain Loc --      Pain Edu? --      Excl. in [place]? --    No data found.  Updated Vital Signs BP 112/75 (BP Location: Right Arm)   Pulse 90   Temp 98.4 F (36.9 C) (Oral)   Resp 16   LMP 11/11/2021 (Approximate)   SpO2 99%   Visual Acuity Right Eye Distance:   Left Eye Distance:   Bilateral Distance:    Right Eye Near:   Left Eye Near:    Bilateral Near:     Physical Exam Vitals and nursing note reviewed.  Constitutional:      General: She is not in acute  distress.    Appearance: Normal appearance. She is not ill-appearing or toxic-appearing.  HENT:  Head: Normocephalic and atraumatic.     Right Ear: Tympanic membrane, ear canal and external ear normal.     Left Ear: Tympanic membrane, ear canal and external ear normal.     Nose: No congestion or rhinorrhea.     Mouth/Throat:     Mouth: Mucous membranes are moist.     Pharynx: Oropharynx is clear. Posterior oropharyngeal erythema present. No oropharyngeal exudate.     Comments: Cobblestoning of posterior pharynx Eyes:     General: No scleral icterus.    Extraocular Movements: Extraocular movements intact.  Cardiovascular:     Rate and Rhythm: Normal rate and regular rhythm.  Pulmonary:     Effort: Pulmonary effort is normal. No respiratory distress.     Breath sounds: Normal breath sounds. No wheezing, rhonchi or rales.  Musculoskeletal:     Cervical back: Normal range of motion and neck supple.  Lymphadenopathy:     Cervical: No cervical adenopathy.  Skin:    General: Skin is warm and dry.     Coloration: Skin is not jaundiced or pale.     Findings: No erythema or rash.  Neurological:     Mental Status: She is alert and oriented to person, place, and time.  Psychiatric:        Behavior: Behavior is cooperative.      UC Treatments / Results  Labs (all labs ordered are listed, but only abnormal results are displayed) Labs Reviewed - No data to display  EKG   Radiology DG Chest 2 View  Result Date: 12/09/2021 CLINICAL DATA:  Cough for 1 month, at times feels like she cannot breathe when coughing EXAM: CHEST - 2 VIEW COMPARISON:  None Available. FINDINGS: Normal heart size, mediastinal contours, and pulmonary vascularity. Lungs clear. No pulmonary infiltrate, pleural effusion, or pneumothorax. Minimal biconvex thoracic scoliosis. IMPRESSION: No acute abnormalities. Electronically Signed   By: Lavonia Dana M.D.   On: 12/09/2021 08:34    Procedures Procedures (including  critical care time)  Medications Ordered in UC Medications - No data to display  Initial Impression / Assessment and Plan / UC Course  I have reviewed the triage vital signs and the nursing notes.  Pertinent labs & imaging results that were available during my care of the patient were reviewed by me and considered in my medical decision making (see chart for details).    Patient is well-appearing, normotensive, afebrile, not tachycardic, not tachypneic, oxygenating well on room air.  I have no concern for pneumonia today given clear lung exam, oxygenating 99% on room air, and recent chest x-ray negative for pneumonia.  I am highly suspicious for postnasal drainage/postviral cough.  We will treat with oral benzonatate every 8 hours as needed for cough as well as Flonase nasal spray, cough syrup at nighttime.  ER and return precautions discussed.  Note given for work.  The patient was given the opportunity to ask questions.  All questions answered to their satisfaction.  The patient is in agreement to this plan.    Final Clinical Impressions(s) / UC Diagnoses   Final diagnoses:  Acute cough  Post-nasal drainage     Discharge Instructions      The cough is likely from the postnasal drainage and may also be a postviral cough.  This may last for a few weeks, but hopefully improves slowly.  Please start on the benzonatate up to 3 times daily as needed.  You can use a cough syrup at nighttime.  Please start the Flonase  twice daily to help with the postnasal drainage.    ED Prescriptions     Medication Sig Dispense Auth. Provider   benzonatate (TESSALON) 100 MG capsule Take 1 capsule (100 mg total) by mouth 3 (three) times daily as needed for cough. Do not take with alcohol or while driving or operating heavy machinery .  May cause drowsiness. 20 capsule Noemi Chapel A, NP   fluticasone (FLONASE) 50 MCG/ACT nasal spray Place 1 spray into both nostrils 2 (two) times daily. 16 g Noemi Chapel A, NP   promethazine-dextromethorphan (PROMETHAZINE-DM) 6.25-15 MG/5ML syrup Take 5 mLs by mouth at bedtime as needed for cough. Do not take with alcohol or while driving or operating heavy machinery.  May cause drowsiness. 118 mL Eulogio Bear, NP      PDMP not reviewed this encounter.   Eulogio Bear, NP 12/09/21 1324

## 2021-12-09 NOTE — Discharge Instructions (Signed)
The cough is likely from the postnasal drainage and may also be a postviral cough.  This may last for a few weeks, but hopefully improves slowly.  Please start on the benzonatate up to 3 times daily as needed.  You can use a cough syrup at nighttime.  Please start the Flonase twice daily to help with the postnasal drainage.

## 2021-12-09 NOTE — ED Triage Notes (Signed)
Cough x 2 months.  States she has seen and given allergy medications without relief. Covid test, strep and mono tests have been negative.  States chest burns when she coughs

## 2021-12-17 DIAGNOSIS — K046 Periapical abscess with sinus: Secondary | ICD-10-CM | POA: Diagnosis not present

## 2021-12-27 ENCOUNTER — Encounter: Payer: Self-pay | Admitting: Nurse Practitioner

## 2021-12-27 DIAGNOSIS — Z6838 Body mass index (BMI) 38.0-38.9, adult: Secondary | ICD-10-CM | POA: Diagnosis not present

## 2021-12-27 DIAGNOSIS — E559 Vitamin D deficiency, unspecified: Secondary | ICD-10-CM | POA: Diagnosis not present

## 2021-12-27 DIAGNOSIS — E669 Obesity, unspecified: Secondary | ICD-10-CM | POA: Diagnosis not present

## 2021-12-27 DIAGNOSIS — F5081 Binge eating disorder: Secondary | ICD-10-CM | POA: Diagnosis not present

## 2021-12-27 DIAGNOSIS — E88819 Insulin resistance, unspecified: Secondary | ICD-10-CM | POA: Diagnosis not present

## 2021-12-28 ENCOUNTER — Ambulatory Visit: Payer: Medicaid Other | Admitting: Podiatry

## 2021-12-30 ENCOUNTER — Other Ambulatory Visit: Payer: Self-pay | Admitting: Nurse Practitioner

## 2021-12-30 DIAGNOSIS — R051 Acute cough: Secondary | ICD-10-CM

## 2021-12-30 MED ORDER — MONTELUKAST SODIUM 10 MG PO TABS: 10.0000 mg | ORAL_TABLET | Freq: Every day | ORAL | 3 refills | Status: DC

## 2022-01-06 ENCOUNTER — Other Ambulatory Visit: Payer: Self-pay | Admitting: Nurse Practitioner

## 2022-01-23 DIAGNOSIS — E559 Vitamin D deficiency, unspecified: Secondary | ICD-10-CM | POA: Diagnosis not present

## 2022-01-23 DIAGNOSIS — Z6837 Body mass index (BMI) 37.0-37.9, adult: Secondary | ICD-10-CM | POA: Diagnosis not present

## 2022-01-23 DIAGNOSIS — E88819 Insulin resistance, unspecified: Secondary | ICD-10-CM | POA: Diagnosis not present

## 2022-01-23 DIAGNOSIS — F5081 Binge eating disorder: Secondary | ICD-10-CM | POA: Diagnosis not present

## 2022-01-23 DIAGNOSIS — Z79899 Other long term (current) drug therapy: Secondary | ICD-10-CM | POA: Diagnosis not present

## 2022-01-23 DIAGNOSIS — E669 Obesity, unspecified: Secondary | ICD-10-CM | POA: Diagnosis not present

## 2022-01-31 ENCOUNTER — Ambulatory Visit
Admission: EM | Admit: 2022-01-31 | Discharge: 2022-01-31 | Disposition: A | Discharge status: Home / Self Care | Payer: Medicaid Other | Attending: Nurse Practitioner | Admitting: Nurse Practitioner

## 2022-01-31 DIAGNOSIS — R1030 Lower abdominal pain, unspecified: Secondary | ICD-10-CM | POA: Insufficient documentation

## 2022-01-31 DIAGNOSIS — N760 Acute vaginitis: Secondary | ICD-10-CM | POA: Diagnosis not present

## 2022-01-31 LAB — POCT URINALYSIS DIP (MANUAL ENTRY)
Bilirubin, UA: NEGATIVE
Blood, UA: NEGATIVE
Glucose, UA: NEGATIVE mg/dL
Ketones, POC UA: NEGATIVE mg/dL
Nitrite, UA: NEGATIVE
Protein Ur, POC: NEGATIVE mg/dL
Spec Grav, UA: 1.025 (ref 1.010–1.025)
Urobilinogen, UA: 0.2 E.U./dL
pH, UA: 6.5 (ref 5.0–8.0)

## 2022-01-31 NOTE — ED Triage Notes (Signed)
Pt reports vaginal itching and lower abdominal cramps x 2-3 days. Reports she always gets UTI's.

## 2022-01-31 NOTE — Discharge Instructions (Signed)
Urinalysis today does not show a clear infection.  We have sent for urine culture and will call you if this comes back positive in a couple of days.  We have tested the vaginal swab for BV and yeast infection will call you if this comes back abnormal in the next couple of days.

## 2022-01-31 NOTE — ED Provider Notes (Signed)
[facility]    CSN: [id number] Arrival date & time: 01/31/22  1532      History   Chief Complaint Chief Complaint  Patient presents with   Vaginal Itching    Irritation - Entered by patient    HPI Jody Hansen is a 26 y.o. female.   Patient presents for vaginal irritation for the past 2 days.  She denies vaginal discharge, sores, rashes, and swelling in her groin.  Also reports a history of UTI frequently and has had some lower abdominal cramping and wants to be checked for urinary tract infection.  The abdominal cramping is intermittent and mild.  She denies dysuria, urinary frequency or urgency, new urinary incontinence, foul urinary odor, hematuria, flank pain, back pain, suprapubic pain or pressure, fever, body aches, chills, nausea/vomiting.  No new sexual partner.  Declines STI testing today.    Last menstrual period was in September; reports that she long cycles her OCPs and next menstrual period will be in December.     Past Medical History:  Diagnosis Date   Kidney stones 03/08/2015   Miscarriage    Pregnant 01/23/2015   Scoliosis     Patient Active Problem List   Diagnosis Date Noted   History of miscarriage 07/07/2021   Pregnancy examination or test, negative result 07/07/2021   Chronic midline low back pain 06/23/2021   Generalized anxiety disorder 08/07/2020   Severe episode of recurrent major depressive disorder, without psychotic features ([place]) 08/07/2020   History of shoulder dystocia in prior pregnancy 09/10/2019   Postpartum depression 08/28/2019   Migraines with aura 06/02/2016   Ganglion cyst 97/67/3419   Renal colic on right side 37/90/2409    Past Surgical History:  Procedure Laterality Date   NO PAST SURGERIES      OB History     Gravida  4   Para  2   Term  2   Preterm      AB  2   Living  2      SAB  2   IAB      Ectopic      Multiple  0   Live Births  2            Home Medications     Prior to Admission medications   Medication Sig Start Date End Date Taking? Authorizing Provider  VYVANSE 30 MG capsule Take 30 mg by mouth daily. 01/01/22  Yes [provider]  benzonatate (TESSALON) 100 MG capsule Take 1 capsule (100 mg total) by mouth 3 (three) times daily as needed for cough. Do not take with alcohol or while driving or operating heavy machinery .  May cause drowsiness. 12/09/21   Eulogio Bear, NP  cetirizine (ZYRTEC) 10 MG tablet Take 1 tablet (10 mg total) by mouth daily. Take 1 tablet daily at night 11/25/21   Ameduite, Trenton Gammon, FNP  citalopram (CELEXA) 40 MG tablet Take 40 mg by mouth daily. Patient not taking: Reported on 11/25/2021    [provider]  fluticasone (FLONASE) 50 MCG/ACT nasal spray Place 1 spray into both nostrils 2 (two) times daily. 12/09/21   Eulogio Bear, NP  hydrOXYzine (ATARAX) 10 MG tablet Take 10 mg by mouth 3 (three) times daily as needed. Patient not taking: Reported on 11/25/2021    [provider]  levonorgestrel-ethinyl estradiol (SEASONALE) 0.15-0.03 MG tablet Take 1 tablet by mouth daily. 11/25/21   Ameduite, Trenton Gammon, FNP  loratadine (CLARITIN) 10 MG  tablet Take 1 tablet (10 mg total) by mouth daily. Take 1 tablet daily in the mornings if you do not get relief from Zyrtec. 11/25/21   Ameduite, Trenton Gammon, FNP  meloxicam (MOBIC) 15 MG tablet Take 1 tablet (15 mg total) by mouth daily. Patient not taking: Reported on 11/25/2021 10/28/21   Criselda Peaches, DPM  montelukast (SINGULAIR) 10 MG tablet Take 1 tablet (10 mg total) by mouth at bedtime. 12/30/21   Ameduite, Trenton Gammon, FNP  pantoprazole (PROTONIX) 20 MG tablet Take 1 tablet (20 mg total) by mouth daily. 11/18/21   Vanessa Kick, MD  promethazine-dextromethorphan (PROMETHAZINE-DM) 6.25-15 MG/5ML syrup Take 5 mLs by mouth at bedtime as needed for cough. Do not take with alcohol or while driving or operating heavy machinery.  May cause drowsiness. 12/09/21    Eulogio Bear, NP  pseudoephedrine (SUDAFED) 30 MG tablet Take 1 tablet (30 mg total) by mouth every 8 (eight) hours as needed for congestion. For sinus and nasal congestion. Patient not taking: Reported on 11/25/2021 10/28/21   Nyoka Lint, PA-C    Family History Family History  Problem Relation Age of Onset   Diabetes Paternal Grandfather    Diabetes Paternal Grandmother    Cancer Maternal Grandmother    Cancer Maternal Grandfather    Diabetes Father    Diabetes Mother    Diabetes Sister        borderline   Lung disease Paternal Aunt     Social History Social History   Tobacco Use   Smoking status: Never   Smokeless tobacco: Never  Vaping Use   Vaping Use: Never used  Substance Use Topics   Alcohol use: No   Drug use: No     Allergies   Depo-provera [medroxyprogesterone acetate]   Review of Systems Review of Systems Per HPI  Physical Exam Triage Vital Signs ED Triage Vitals  Enc Vitals Group     BP 01/31/22 1554 115/79     Pulse Rate 01/31/22 1554 88     Resp 01/31/22 1554 15     Temp 01/31/22 1554 98.7 F (37.1 C)     Temp Source 01/31/22 1554 Oral     SpO2 01/31/22 1554 98 %     Weight --      Height --      Head Circumference --      Peak Flow --      Pain Score 01/31/22 1552 0     Pain Loc --      Pain Edu? --      Excl. in [place]? --    No data found.  Updated Vital Signs BP 115/79 (BP Location: Right Arm)   Pulse 88   Temp 98.7 F (37.1 C) (Oral)   Resp 15   SpO2 98%   Visual Acuity Right Eye Distance:   Left Eye Distance:   Bilateral Distance:    Right Eye Near:   Left Eye Near:    Bilateral Near:     Physical Exam Vitals and nursing note reviewed.  Constitutional:      General: She is not in acute distress.    Appearance: Normal appearance. She is not toxic-appearing.  Pulmonary:     Effort: Pulmonary effort is normal. No respiratory distress.  Genitourinary:    Comments: Deferred - self swab performed Skin:     General: Skin is warm and dry.     Coloration: Skin is not jaundiced or pale.     Findings: No  erythema.  Neurological:     Mental Status: She is alert and oriented to person, place, and time.     Motor: No weakness.     Gait: Gait normal.  Psychiatric:        Behavior: Behavior is cooperative.      UC Treatments / Results  Labs (all labs ordered are listed, but only abnormal results are displayed) Labs Reviewed  POCT URINALYSIS DIP (MANUAL ENTRY) - Abnormal; Notable for the following components:      Result Value   Leukocytes, UA Small (1+) (*)    All other components within normal limits  URINE CULTURE  CERVICOVAGINAL ANCILLARY ONLY    EKG   Radiology No results found.  Procedures Procedures (including critical care time)  Medications Ordered in UC Medications - No data to display  Initial Impression / Assessment and Plan / UC Course  I have reviewed the triage vital signs and the nursing notes.  Pertinent labs & imaging results that were available during my care of the patient were reviewed by me and considered in my medical decision making (see chart for details).   Patient is well-appearing, normotensive, afebrile, not tachycardic, not tachypneic, oxygenating well on room air.    Lower abdominal pain Urinalysis today is unremarkable with exception of small amount of leukocyte esterase Will send for urine culture, defer treatment until urine culture results  Acute vaginitis Self swab to check for BV, yeast infection Patient declines STI testing today  The patient was given the opportunity to ask questions.  All questions answered to their satisfaction.  The patient is in agreement to this plan.    Final Clinical Impressions(s) / UC Diagnoses   Final diagnoses:  Lower abdominal pain  Acute vaginitis     Discharge Instructions      Urinalysis today does not show a clear infection.  We have sent for urine culture and will call you if this comes back  positive in a couple of days.  We have tested the vaginal swab for BV and yeast infection will call you if this comes back abnormal in the next couple of days.     ED Prescriptions   None    PDMP not reviewed this encounter.   Eulogio Bear, NP 01/31/22 1654

## 2022-02-01 LAB — CERVICOVAGINAL ANCILLARY ONLY
Bacterial Vaginitis (gardnerella): NEGATIVE
Candida Glabrata: NEGATIVE
Candida Vaginitis: NEGATIVE
Comment: NEGATIVE
Comment: NEGATIVE
Comment: NEGATIVE

## 2022-02-01 LAB — URINE CULTURE: Culture: <10000 — AB

## 2022-02-02 ENCOUNTER — Institutional Professional Consult (permissible substitution): Payer: Medicaid Other | Admitting: Pulmonary Disease

## 2022-02-02 NOTE — Progress Notes (Deleted)
Synopsis: Referred in November 2023 for cough  Subjective:   PATIENT ID: Jody Hansen GENDER: female DOB: [birth year], MRN: [id number]   HPI  No chief complaint on file.   *** Record review: October 2023 urgent care record review from [place] where the patient was seen in the setting of acute cough felt to be due to allergic rhinitis and postnasal drip.  Chest x-ray ordered.  Past Medical History:  Diagnosis Date   Kidney stones 03/08/2015   Miscarriage    Pregnant 01/23/2015   Scoliosis      Family History  Problem Relation Age of Onset   Diabetes Paternal Grandfather    Diabetes Paternal Grandmother    Cancer Maternal Grandmother    Cancer Maternal Grandfather    Diabetes Father    Diabetes Mother    Diabetes Sister        borderline   Lung disease Paternal Aunt      Social History   Socioeconomic History   Marital status: Significant Other    Spouse name: Not on file   Number of children: 1   Years of education: Not on file   Highest education level: High school graduate  Occupational History   Occupation: call center  Tobacco Use   Smoking status: Never   Smokeless tobacco: Never  Vaping Use   Vaping Use: Never used  Substance and Sexual Activity   Alcohol use: No   Drug use: No   Sexual activity: Yes    Birth control/protection: Pill  Other Topics Concern   Not on file  Social History Narrative   Not on file   Social Determinants of Health   Financial Resource Strain: Low Risk  (02/11/2019)   Overall Financial Resource Strain (CARDIA)    Difficulty of Paying Living Expenses: Not hard at all  Food Insecurity: No Food Insecurity (02/11/2019)   Hunger Vital Sign    Worried About Running Out of Food in the Last Year: Never true    Ran Out of Food in the Last Year: Never true  Transportation Needs: No Transportation Needs (02/11/2019)   PRAPARE - Hydrologist (Medical): No    Lack of Transportation  (Non-Medical): No  Physical Activity: Inactive (02/11/2019)   Exercise Vital Sign    Days of Exercise per Week: 0 days    Minutes of Exercise per Session: 0 min  Stress: No Stress Concern Present (02/11/2019)   [place]    Feeling of Stress : Not at all  Social Connections: Moderately Isolated (02/11/2019)   Social Connection and Isolation Panel [NHANES]    Frequency of Communication with Friends and Family: More than three times a week    Frequency of Social Gatherings with Friends and Family: Twice a week    Attends Religious Services: Never    Marine scientist or Organizations: No    Attends Archivist Meetings: Never    Marital Status: Living with partner  Intimate Partner Violence: Not At Risk (02/11/2019)   Humiliation, Afraid, Rape, and Kick questionnaire    Fear of Current or Ex-Partner: No    Emotionally Abused: No    Physically Abused: No    Sexually Abused: No     Allergies  Allergen Reactions   Depo-Provera [Medroxyprogesterone Acetate] Other (See Comments)    Weight gain; bruising; headaches     Outpatient Medications Prior to Visit  Medication Sig Dispense Refill  benzonatate (TESSALON) 100 MG capsule Take 1 capsule (100 mg total) by mouth 3 (three) times daily as needed for cough. Do not take with alcohol or while driving or operating heavy machinery .  May cause drowsiness. 20 capsule 0   cetirizine (ZYRTEC) 10 MG tablet Take 1 tablet (10 mg total) by mouth daily. Take 1 tablet daily at night 30 tablet 1   citalopram (CELEXA) 40 MG tablet Take 40 mg by mouth daily. (Patient not taking: Reported on 11/25/2021)     fluticasone (FLONASE) 50 MCG/ACT nasal spray Place 1 spray into both nostrils 2 (two) times daily. 16 g 0   hydrOXYzine (ATARAX) 10 MG tablet Take 10 mg by mouth 3 (three) times daily as needed. (Patient not taking: Reported on 11/25/2021)     levonorgestrel-ethinyl estradiol  (SEASONALE) 0.15-0.03 MG tablet Take 1 tablet by mouth daily. 84 tablet 3   loratadine (CLARITIN) 10 MG tablet Take 1 tablet (10 mg total) by mouth daily. Take 1 tablet daily in the mornings if you do not get relief from Zyrtec. 30 tablet 1   meloxicam (MOBIC) 15 MG tablet Take 1 tablet (15 mg total) by mouth daily. (Patient not taking: Reported on 11/25/2021) 30 tablet 3   montelukast (SINGULAIR) 10 MG tablet Take 1 tablet (10 mg total) by mouth at bedtime. 30 tablet 3   pantoprazole (PROTONIX) 20 MG tablet Take 1 tablet (20 mg total) by mouth daily. 30 tablet 0   promethazine-dextromethorphan (PROMETHAZINE-DM) 6.25-15 MG/5ML syrup Take 5 mLs by mouth at bedtime as needed for cough. Do not take with alcohol or while driving or operating heavy machinery.  May cause drowsiness. 118 mL 0   pseudoephedrine (SUDAFED) 30 MG tablet Take 1 tablet (30 mg total) by mouth every 8 (eight) hours as needed for congestion. For sinus and nasal congestion. (Patient not taking: Reported on 11/25/2021) 30 tablet 0   VYVANSE 30 MG capsule Take 30 mg by mouth daily.     No facility-administered medications prior to visit.    ROS    Objective:  Physical Exam   There were no vitals filed for this visit.  ***  CBC    Component Value Date/Time   WBC 6.6 04/02/2020 1935   RBC 4.72 04/02/2020 1935   HGB 14.6 04/02/2020 1935   HGB 12.2 05/13/2019 0900   HCT 43.2 04/02/2020 1935   HCT 37.9 05/13/2019 0900   PLT 296 04/02/2020 1935   PLT 219 05/13/2019 0900   MCV 91.5 04/02/2020 1935   MCV 94 05/13/2019 0900   MCH 30.9 04/02/2020 1935   MCHC 33.8 04/02/2020 1935   RDW 12.5 04/02/2020 1935   RDW 12.7 05/13/2019 0900   LYMPHSABS 1.4 02/05/2019 1036   MONOABS 0.6 08/24/2018 2139   EOSABS 0.0 02/05/2019 1036   BASOSABS 0.0 02/05/2019 1036     Chest imaging: October 2023 two-view chest x-ray images independently reviewed showing normal pulmonary parenchyma, normal cardiac  silhouette  PFT:  Labs:  Path:  Echo:  Heart Catheterization:       Assessment & Plan:   No diagnosis found.  Discussion: ***  Immunizations: Immunization History  Administered Date(s) Administered   DTaP 08/30/2000   HPV Quadrivalent 01/13/2012, 04/05/2012, 08/30/2012   Hepatitis A, Ped/Adol-2 Dose 07/11/2014   IPV 08/30/2000   Influenza-Unspecified 12/26/2011   MMR 08/30/2000, 10/11/2000   Meningococcal Conjugate 07/12/2007, 07/11/2014   Tdap 07/12/2007, 06/10/2019   Varicella 10/11/2000     Current Outpatient Medications:    benzonatate (  TESSALON) 100 MG capsule, Take 1 capsule (100 mg total) by mouth 3 (three) times daily as needed for cough. Do not take with alcohol or while driving or operating heavy machinery .  May cause drowsiness., Disp: 20 capsule, Rfl: 0   cetirizine (ZYRTEC) 10 MG tablet, Take 1 tablet (10 mg total) by mouth daily. Take 1 tablet daily at night, Disp: 30 tablet, Rfl: 1   citalopram (CELEXA) 40 MG tablet, Take 40 mg by mouth daily. (Patient not taking: Reported on 11/25/2021), Disp: , Rfl:    fluticasone (FLONASE) 50 MCG/ACT nasal spray, Place 1 spray into both nostrils 2 (two) times daily., Disp: 16 g, Rfl: 0   hydrOXYzine (ATARAX) 10 MG tablet, Take 10 mg by mouth 3 (three) times daily as needed. (Patient not taking: Reported on 11/25/2021), Disp: , Rfl:    levonorgestrel-ethinyl estradiol (SEASONALE) 0.15-0.03 MG tablet, Take 1 tablet by mouth daily., Disp: 84 tablet, Rfl: 3   loratadine (CLARITIN) 10 MG tablet, Take 1 tablet (10 mg total) by mouth daily. Take 1 tablet daily in the mornings if you do not get relief from Zyrtec., Disp: 30 tablet, Rfl: 1   meloxicam (MOBIC) 15 MG tablet, Take 1 tablet (15 mg total) by mouth daily. (Patient not taking: Reported on 11/25/2021), Disp: 30 tablet, Rfl: 3   montelukast (SINGULAIR) 10 MG tablet, Take 1 tablet (10 mg total) by mouth at bedtime., Disp: 30 tablet, Rfl: 3   pantoprazole (PROTONIX) 20 MG  tablet, Take 1 tablet (20 mg total) by mouth daily., Disp: 30 tablet, Rfl: 0   promethazine-dextromethorphan (PROMETHAZINE-DM) 6.25-15 MG/5ML syrup, Take 5 mLs by mouth at bedtime as needed for cough. Do not take with alcohol or while driving or operating heavy machinery.  May cause drowsiness., Disp: 118 mL, Rfl: 0   pseudoephedrine (SUDAFED) 30 MG tablet, Take 1 tablet (30 mg total) by mouth every 8 (eight) hours as needed for congestion. For sinus and nasal congestion. (Patient not taking: Reported on 11/25/2021), Disp: 30 tablet, Rfl: 0   VYVANSE 30 MG capsule, Take 30 mg by mouth daily., Disp: , Rfl:

## 2022-02-05 DIAGNOSIS — J029 Acute pharyngitis, unspecified: Secondary | ICD-10-CM | POA: Diagnosis not present

## 2022-02-05 DIAGNOSIS — R0981 Nasal congestion: Secondary | ICD-10-CM | POA: Diagnosis not present

## 2022-02-05 DIAGNOSIS — R051 Acute cough: Secondary | ICD-10-CM | POA: Diagnosis not present

## 2022-02-05 DIAGNOSIS — H9203 Otalgia, bilateral: Secondary | ICD-10-CM | POA: Diagnosis not present

## 2022-02-06 ENCOUNTER — Encounter: Payer: Self-pay | Admitting: Family Medicine

## 2022-02-13 DIAGNOSIS — M940 Chondrocostal junction syndrome [Tietze]: Secondary | ICD-10-CM | POA: Diagnosis not present

## 2022-02-13 DIAGNOSIS — R051 Acute cough: Secondary | ICD-10-CM | POA: Diagnosis not present

## 2022-02-15 DIAGNOSIS — E559 Vitamin D deficiency, unspecified: Secondary | ICD-10-CM | POA: Diagnosis not present

## 2022-02-15 DIAGNOSIS — E669 Obesity, unspecified: Secondary | ICD-10-CM | POA: Diagnosis not present

## 2022-02-15 DIAGNOSIS — F5081 Binge eating disorder: Secondary | ICD-10-CM | POA: Diagnosis not present

## 2022-02-15 DIAGNOSIS — E88819 Insulin resistance, unspecified: Secondary | ICD-10-CM | POA: Diagnosis not present

## 2022-02-15 DIAGNOSIS — Z6837 Body mass index (BMI) 37.0-37.9, adult: Secondary | ICD-10-CM | POA: Diagnosis not present

## 2022-02-15 DIAGNOSIS — Z9189 Other specified personal risk factors, not elsewhere classified: Secondary | ICD-10-CM | POA: Diagnosis not present

## 2022-02-23 ENCOUNTER — Institutional Professional Consult (permissible substitution): Payer: Medicaid Other | Admitting: Pulmonary Disease

## 2022-02-23 NOTE — Progress Notes (Deleted)
Synopsis: Referred in November 2023 for cough  Subjective:   PATIENT ID: Jody Hansen GENDER: female DOB: 1996/02/22, MRN: [id number]   HPI  No chief complaint on file.   *** Record review: October 2023 urgent care record review from [place] where the patient was seen in the setting of acute cough felt to be due to allergic rhinitis and postnasal drip.  Chest x-ray ordered.  Past Medical History:  Diagnosis Date  . Kidney stones 03/08/2015  . Miscarriage   . Pregnant 01/23/2015  . Scoliosis      Family History  Problem Relation Age of Onset  . Diabetes Paternal Grandfather   . Diabetes Paternal Grandmother   . Cancer Maternal Grandmother   . Cancer Maternal Grandfather   . Diabetes Father   . Diabetes Mother   . Diabetes Sister        borderline  . Lung disease Paternal Aunt      Social History   Socioeconomic History  . Marital status: Significant Other    Spouse name: Not on file  . Number of children: 1  . Years of education: Not on file  . Highest education level: High school graduate  Occupational History  . Occupation: call center  Tobacco Use  . Smoking status: Never  . Smokeless tobacco: Never  Vaping Use  . Vaping Use: Never used  Substance and Sexual Activity  . Alcohol use: No  . Drug use: No  . Sexual activity: Yes    Birth control/protection: Pill  Other Topics Concern  . Not on file  Social History Narrative  . Not on file   Social Determinants of Health   Financial Resource Strain: Low Risk  (02/11/2019)   Overall Financial Resource Strain (CARDIA)   . Difficulty of Paying Living Expenses: Not hard at all  Food Insecurity: No Food Insecurity (02/11/2019)   Hunger Vital Sign   . Worried About Charity fundraiser in the Last Year: Never true   . Ran Out of Food in the Last Year: Never true  Transportation Needs: No Transportation Needs (02/11/2019)   PRAPARE - Transportation   . Lack of Transportation (Medical): No   .  Lack of Transportation (Non-Medical): No  Physical Activity: Inactive (02/11/2019)   Exercise Vital Sign   . Days of Exercise per Week: 0 days   . Minutes of Exercise per Session: 0 min  Stress: No Stress Concern Present (02/11/2019)   [place]   . Feeling of Stress : Not at all  Social Connections: Moderately Isolated (02/11/2019)   Social Connection and Isolation Panel [NHANES]   . Frequency of Communication with Friends and Family: More than three times a week   . Frequency of Social Gatherings with Friends and Family: Twice a week   . Attends Religious Services: Never   . Active Member of Clubs or Organizations: No   . Attends Archivist Meetings: Never   . Marital Status: Living with partner  Intimate Partner Violence: Not At Risk (02/11/2019)   Humiliation, Afraid, Rape, and Kick questionnaire   . Fear of Current or Ex-Partner: No   . Emotionally Abused: No   . Physically Abused: No   . Sexually Abused: No     Allergies  Allergen Reactions  . Depo-Provera [Medroxyprogesterone Acetate] Other (See Comments)    Weight gain; bruising; headaches     Outpatient Medications Prior to Visit  Medication Sig Dispense Refill  .  benzonatate (TESSALON) 100 MG capsule Take 1 capsule (100 mg total) by mouth 3 (three) times daily as needed for cough. Do not take with alcohol or while driving or operating heavy machinery .  May cause drowsiness. 20 capsule 0  . cetirizine (ZYRTEC) 10 MG tablet Take 1 tablet (10 mg total) by mouth daily. Take 1 tablet daily at night 30 tablet 1  . citalopram (CELEXA) 40 MG tablet Take 40 mg by mouth daily. (Patient not taking: Reported on 11/25/2021)    . fluticasone (FLONASE) 50 MCG/ACT nasal spray Place 1 spray into both nostrils 2 (two) times daily. 16 g 0  . hydrOXYzine (ATARAX) 10 MG tablet Take 10 mg by mouth 3 (three) times daily as needed. (Patient not taking: Reported on  11/25/2021)    . levonorgestrel-ethinyl estradiol (SEASONALE) 0.15-0.03 MG tablet Take 1 tablet by mouth daily. 84 tablet 3  . loratadine (CLARITIN) 10 MG tablet Take 1 tablet (10 mg total) by mouth daily. Take 1 tablet daily in the mornings if you do not get relief from Zyrtec. 30 tablet 1  . meloxicam (MOBIC) 15 MG tablet Take 1 tablet (15 mg total) by mouth daily. (Patient not taking: Reported on 11/25/2021) 30 tablet 3  . montelukast (SINGULAIR) 10 MG tablet Take 1 tablet (10 mg total) by mouth at bedtime. 30 tablet 3  . pantoprazole (PROTONIX) 20 MG tablet Take 1 tablet (20 mg total) by mouth daily. 30 tablet 0  . promethazine-dextromethorphan (PROMETHAZINE-DM) 6.25-15 MG/5ML syrup Take 5 mLs by mouth at bedtime as needed for cough. Do not take with alcohol or while driving or operating heavy machinery.  May cause drowsiness. 118 mL 0  . pseudoephedrine (SUDAFED) 30 MG tablet Take 1 tablet (30 mg total) by mouth every 8 (eight) hours as needed for congestion. For sinus and nasal congestion. (Patient not taking: Reported on 11/25/2021) 30 tablet 0  . VYVANSE 30 MG capsule Take 30 mg by mouth daily.     No facility-administered medications prior to visit.    ROS    Objective:  Physical Exam   There were no vitals filed for this visit.  ***  CBC    Component Value Date/Time   WBC 6.6 04/02/2020 1935   RBC 4.72 04/02/2020 1935   HGB 14.6 04/02/2020 1935   HGB 12.2 05/13/2019 0900   HCT 43.2 04/02/2020 1935   HCT 37.9 05/13/2019 0900   PLT 296 04/02/2020 1935   PLT 219 05/13/2019 0900   MCV 91.5 04/02/2020 1935   MCV 94 05/13/2019 0900   MCH 30.9 04/02/2020 1935   MCHC 33.8 04/02/2020 1935   RDW 12.5 04/02/2020 1935   RDW 12.7 05/13/2019 0900   LYMPHSABS 1.4 02/05/2019 1036   MONOABS 0.6 08/24/2018 2139   EOSABS 0.0 02/05/2019 1036   BASOSABS 0.0 02/05/2019 1036     Chest imaging: October 2023 two-view chest x-ray images independently reviewed showing normal pulmonary  parenchyma, normal cardiac silhouette  PFT:  Labs:  Path:  Echo:  Heart Catheterization:       Assessment & Plan:   No diagnosis found.  Discussion: ***  Immunizations: Immunization History  Administered Date(s) Administered  . DTaP 08/30/2000  . HPV Quadrivalent 01/13/2012, 04/05/2012, 08/30/2012  . Hepatitis A, Ped/Adol-2 Dose 07/11/2014  . IPV 08/30/2000  . Influenza-Unspecified 12/26/2011  . MMR 08/30/2000, 10/11/2000  . Meningococcal Conjugate 07/12/2007, 07/11/2014  . Tdap 07/12/2007, 06/10/2019  . Varicella 10/11/2000     Current Outpatient Medications:  .  benzonatate (  TESSALON) 100 MG capsule, Take 1 capsule (100 mg total) by mouth 3 (three) times daily as needed for cough. Do not take with alcohol or while driving or operating heavy machinery .  May cause drowsiness., Disp: 20 capsule, Rfl: 0 .  cetirizine (ZYRTEC) 10 MG tablet, Take 1 tablet (10 mg total) by mouth daily. Take 1 tablet daily at night, Disp: 30 tablet, Rfl: 1 .  citalopram (CELEXA) 40 MG tablet, Take 40 mg by mouth daily. (Patient not taking: Reported on 11/25/2021), Disp: , Rfl:  .  fluticasone (FLONASE) 50 MCG/ACT nasal spray, Place 1 spray into both nostrils 2 (two) times daily., Disp: 16 g, Rfl: 0 .  hydrOXYzine (ATARAX) 10 MG tablet, Take 10 mg by mouth 3 (three) times daily as needed. (Patient not taking: Reported on 11/25/2021), Disp: , Rfl:  .  levonorgestrel-ethinyl estradiol (SEASONALE) 0.15-0.03 MG tablet, Take 1 tablet by mouth daily., Disp: 84 tablet, Rfl: 3 .  loratadine (CLARITIN) 10 MG tablet, Take 1 tablet (10 mg total) by mouth daily. Take 1 tablet daily in the mornings if you do not get relief from Zyrtec., Disp: 30 tablet, Rfl: 1 .  meloxicam (MOBIC) 15 MG tablet, Take 1 tablet (15 mg total) by mouth daily. (Patient not taking: Reported on 11/25/2021), Disp: 30 tablet, Rfl: 3 .  montelukast (SINGULAIR) 10 MG tablet, Take 1 tablet (10 mg total) by mouth at bedtime., Disp: 30  tablet, Rfl: 3 .  pantoprazole (PROTONIX) 20 MG tablet, Take 1 tablet (20 mg total) by mouth daily., Disp: 30 tablet, Rfl: 0 .  promethazine-dextromethorphan (PROMETHAZINE-DM) 6.25-15 MG/5ML syrup, Take 5 mLs by mouth at bedtime as needed for cough. Do not take with alcohol or while driving or operating heavy machinery.  May cause drowsiness., Disp: 118 mL, Rfl: 0 .  pseudoephedrine (SUDAFED) 30 MG tablet, Take 1 tablet (30 mg total) by mouth every 8 (eight) hours as needed for congestion. For sinus and nasal congestion. (Patient not taking: Reported on 11/25/2021), Disp: 30 tablet, Rfl: 0 .  VYVANSE 30 MG capsule, Take 30 mg by mouth daily., Disp: , Rfl:

## 2022-03-08 ENCOUNTER — Encounter: Payer: Self-pay | Admitting: Family Medicine

## 2022-03-09 DIAGNOSIS — Z6837 Body mass index (BMI) 37.0-37.9, adult: Secondary | ICD-10-CM | POA: Diagnosis not present

## 2022-03-09 DIAGNOSIS — F5081 Binge eating disorder: Secondary | ICD-10-CM | POA: Diagnosis not present

## 2022-03-09 DIAGNOSIS — G4750 Parasomnia, unspecified: Secondary | ICD-10-CM | POA: Diagnosis not present

## 2022-03-09 DIAGNOSIS — G4710 Hypersomnia, unspecified: Secondary | ICD-10-CM | POA: Diagnosis not present

## 2022-03-10 ENCOUNTER — Encounter (HOSPITAL_COMMUNITY): Payer: Self-pay

## 2022-03-10 ENCOUNTER — Ambulatory Visit (HOSPITAL_COMMUNITY)
Admission: EM | Admit: 2022-03-10 | Discharge: 2022-03-10 | Disposition: A | Discharge status: Home / Self Care | Payer: Medicaid Other | Attending: Emergency Medicine | Admitting: Emergency Medicine

## 2022-03-10 DIAGNOSIS — R053 Chronic cough: Secondary | ICD-10-CM | POA: Diagnosis not present

## 2022-03-10 DIAGNOSIS — M940 Chondrocostal junction syndrome [Tietze]: Secondary | ICD-10-CM | POA: Diagnosis not present

## 2022-03-10 MED ORDER — PREDNISONE 20 MG PO TABS: 40.0000 mg | ORAL_TABLET | Freq: Every day | ORAL | 0 refills | Status: DC

## 2022-03-10 MED ORDER — CYCLOBENZAPRINE HCL 10 MG PO TABS: 10.0000 mg | ORAL_TABLET | Freq: Two times a day (BID) | ORAL | 0 refills | Status: DC | PRN

## 2022-03-10 NOTE — ED Provider Notes (Signed)
[facility]    CSN: [id number] Arrival date & time: 03/10/22  1548     History   Chief Complaint Chief Complaint  Patient presents with   Cough   Chest Pain    Ribs     HPI Jody Hansen is a 27 y.o. female.  Presents with 30-monthhistory of dry cough Reports over the last month feeling right sided rib pain.  It is worse with deep breath, stretching, twisting. Denies any falls or injury.  Has not noticed any bruising or rash. She has tried occasional cough medicine and ibuprofen.  She saw her primary care provider a few times in October who recommended she see a pulmonologist. Xray at that time was negative.  She denies any fevers, shortness of breath, trouble breathing, abdominal pain, vomiting.  Past Medical History:  Diagnosis Date   Kidney stones 03/08/2015   Miscarriage    Pregnant 01/23/2015   Scoliosis     Patient Active Problem List   Diagnosis Date Noted   History of miscarriage 07/07/2021   Pregnancy examination or test, negative result 07/07/2021   Chronic midline low back pain 06/23/2021   Generalized anxiety disorder 08/07/2020   Severe episode of recurrent major depressive disorder, without psychotic features ([place] 08/07/2020   History of shoulder dystocia in prior pregnancy 09/10/2019   Postpartum depression 08/28/2019   Migraines with aura 06/02/2016   Ganglion cyst 076/73/4193  Renal colic on right side 079/04/4095   Past Surgical History:  Procedure Laterality Date   NO PAST SURGERIES      OB History     Gravida  4   Para  2   Term  2   Preterm      AB  2   Living  2      SAB  2   IAB      Ectopic      Multiple  0   Live Births  2            Home Medications    Prior to Admission medications   Medication Sig Start Date End Date Taking? Authorizing Provider  cyclobenzaprine (FLEXERIL) 10 MG tablet Take 1 tablet (10 mg total) by mouth 2 (two) times daily as needed for muscle spasms. 03/10/22  Yes  Amier Hoyt, RWells Guiles PA-C  levonorgestrel-ethinyl estradiol (SEASONALE) 0.15-0.03 MG tablet Take 1 tablet by mouth daily. 11/25/21  Yes Ameduite, LTrenton Gammon FNP  predniSONE (DELTASONE) 20 MG tablet Take 2 tablets (40 mg total) by mouth daily with breakfast for 5 days. 03/10/22 03/15/22 Yes Bryceton Hantz, PA-C  VYVANSE 30 MG capsule Take 30 mg by mouth daily. 01/01/22  Yes [provider]  benzonatate (TESSALON) 100 MG capsule Take 1 capsule (100 mg total) by mouth 3 (three) times daily as needed for cough. Do not take with alcohol or while driving or operating heavy machinery .  May cause drowsiness. 12/09/21   MEulogio Bear NP  cetirizine (ZYRTEC) 10 MG tablet Take 1 tablet (10 mg total) by mouth daily. Take 1 tablet daily at night 11/25/21   Ameduite, LTrenton Gammon FNP  citalopram (CELEXA) 40 MG tablet Take 40 mg by mouth daily. Patient not taking: Reported on 11/25/2021    [provider]  fluticasone (FLONASE) 50 MCG/ACT nasal spray Place 1 spray into both nostrils 2 (two) times daily. 12/09/21   MEulogio Bear NP  hydrOXYzine (ATARAX) 10 MG tablet Take 10 mg by mouth 3 (three) times daily as  needed. Patient not taking: Reported on 11/25/2021    [provider]  loratadine (CLARITIN) 10 MG tablet Take 1 tablet (10 mg total) by mouth daily. Take 1 tablet daily in the mornings if you do not get relief from Zyrtec. 11/25/21   [employer]med[employer], Trenton Gammon, FNP  montelukast (SINGULAIR) 10 MG tablet Take 1 tablet (10 mg total) by mouth at bedtime. 12/30/21   [employer]med[employer], Trenton Gammon, FNP  pantoprazole (PROTONIX) 20 MG tablet Take 1 tablet (20 mg total) by mouth daily. 11/18/21   Vanessa Kick, MD  promethazine-dextromethorphan (PROMETHAZINE-DM) 6.25-15 MG/5ML syrup Take 5 mLs by mouth at bedtime as needed for cough. Do not take with alcohol or while driving or operating heavy machinery.  May cause drowsiness. 12/09/21   Eulogio Bear, NP  pseudoephedrine (SUDAFED) 30 MG tablet Take 1 tablet  (30 mg total) by mouth every 8 (eight) hours as needed for congestion. For sinus and nasal congestion. Patient not taking: Reported on 11/25/2021 10/28/21   Nyoka Lint, PA-C    Family History Family History  Problem Relation Age of Onset   Diabetes Paternal Grandfather    Diabetes Paternal Grandmother    Cancer Maternal Grandmother    Cancer Maternal Grandfather    Diabetes Father    Diabetes Mother    Diabetes Sister        borderline   Lung disease Paternal Aunt     Social History Social History   Tobacco Use   Smoking status: Never   Smokeless tobacco: Never  Vaping Use   Vaping Use: Never used  Substance Use Topics   Alcohol use: No   Drug use: No     Allergies   Depo-provera [medroxyprogesterone acetate]   Review of Systems Review of Systems As per HPI  Physical Exam Triage Vital Signs ED Triage Vitals  Enc Vitals Group     BP 03/10/22 1852 115/75     Pulse Rate 03/10/22 1852 79     Resp 03/10/22 1852 16     Temp 03/10/22 1852 98.5 F (36.9 C)     Temp Source 03/10/22 1852 Oral     SpO2 03/10/22 1852 100 %     Weight 03/10/22 1851 197 lb (89.4 kg)     Height 03/10/22 1851 '5\' 1"'$  (1.549 m)     Head Circumference --      Peak Flow --      Pain Score 03/10/22 1850 6     Pain Loc --      Pain Edu? --      Excl. in [place]? --    No data found.  Updated Vital Signs BP 115/75 (BP Location: Left Arm)   Pulse 79   Temp 98.5 F (36.9 C) (Oral)   Resp 16   Ht '5\' 1"'$  (1.549 m)   Wt 197 lb (89.4 kg)   LMP 02/21/2022 (Approximate)   SpO2 100%   BMI 37.22 kg/m   Physical Exam Vitals and nursing note reviewed.  Constitutional:      General: She is not in acute distress.    Appearance: Normal appearance. She is not ill-appearing.  HENT:     Mouth/Throat:     Pharynx: Oropharynx is clear.  Cardiovascular:     Rate and Rhythm: Normal rate and regular rhythm.     Pulses: Normal pulses.     Heart sounds: Normal heart sounds.  Pulmonary:     Effort:  Pulmonary effort is normal.     Breath sounds: Normal  breath sounds.  Chest:     Chest wall: No mass, deformity or tenderness.  Musculoskeletal:        General: Normal range of motion.     Cervical back: Normal range of motion.  Lymphadenopathy:     Cervical: No cervical adenopathy.  Skin:    Findings: No bruising, erythema, lesion or rash.  Neurological:     Mental Status: She is alert.     UC Treatments / Results  Labs (all labs ordered are listed, but only abnormal results are displayed) Labs Reviewed - No data to display  EKG  Radiology No results found.  Procedures Procedures   Medications Ordered in UC Medications - No data to display  Initial Impression / Assessment and Plan / UC Course  I have reviewed the triage vital signs and the nursing notes.  Pertinent labs & imaging results that were available during my care of the patient were reviewed by me and considered in my medical decision making (see chart for details).  Likely costochondritis No indication for repeat imaging today given chronic cough, clear x-ray 2 months ago, follow-up with pulmonology this month. Discussed use of consistent anti-inflammatory medicine such as ibuprofen/Tylenol, hot pad on the area, gentle stretching. Additionally we will try short course prednisone to reduce inflammation.  Muscle relaxer twice daily may help as well.  No red flags or concerns today otherwise.  She does work at the [employer] doing lots of heavy lifting and twisting which is likely contributing to her symptoms.  Provided the [employer] employee health and wellness contact information, she will make an appointment with them regarding FMLA/short-term disability and restrictions.  Return precautions discussed. Patient agrees to plan  Final Clinical Impressions(s) / UC Diagnoses   Final diagnoses:  Costochondritis  Chronic cough     Discharge Instructions      I recommend the prednisone course to reduce  inflammation. Continue ibuprofen or tylenol as well. Try hot pad or warm compress to the area. Muscle relaxer can be taken twice daily. If it makes you drowsy, take only at night.  Please contact the employee health and wellness office. They will be able to help you with short term disability, FMLA, work restrictions.  I also recommend follow up with lung specialist.     ED Prescriptions     Medication Sig Dispense Auth. Provider   predniSONE (DELTASONE) 20 MG tablet Take 2 tablets (40 mg total) by mouth daily with breakfast for 5 days. 10 tablet Genevieve Arbaugh, PA-C   cyclobenzaprine (FLEXERIL) 10 MG tablet Take 1 tablet (10 mg total) by mouth 2 (two) times daily as needed for muscle spasms. 20 tablet Michalle Rademaker, Wells Guiles, PA-C      PDMP not reviewed this encounter.   Adalei Novell, Wells Guiles, [place] 03/10/22 2050

## 2022-03-10 NOTE — ED Triage Notes (Signed)
Chief Complaint: rib pain on the right side. No falls or injuries. Patient has a dry cough. X-ray showed inflamed ribs.   Onset: 1 month   Prescriptions or OTC medications tried: Yes- rx cough and inflammation meds.     with no relief  Sick exposure: No  New foods, medications, or products: No  Recent Travel: No

## 2022-03-10 NOTE — Discharge Instructions (Addendum)
I recommend the prednisone course to reduce inflammation. Continue ibuprofen or tylenol as well. Try hot pad or warm compress to the area. Muscle relaxer can be taken twice daily. If it makes you drowsy, take only at night.  Please contact the employee health and wellness office. They will be able to help you with short term disability, FMLA, work restrictions.  I also recommend follow up with lung specialist.

## 2022-03-11 ENCOUNTER — Encounter: Payer: Self-pay | Admitting: Family Medicine

## 2022-03-14 ENCOUNTER — Ambulatory Visit: Payer: Medicaid Other | Admitting: Family Medicine

## 2022-03-14 ENCOUNTER — Encounter: Payer: Self-pay | Admitting: Family Medicine

## 2022-03-14 VITALS — BP 107/71 | Wt 201.8 lb

## 2022-03-14 DIAGNOSIS — F321 Major depressive disorder, single episode, moderate: Secondary | ICD-10-CM | POA: Diagnosis not present

## 2022-03-14 DIAGNOSIS — R0789 Other chest pain: Secondary | ICD-10-CM | POA: Diagnosis not present

## 2022-03-14 DIAGNOSIS — F411 Generalized anxiety disorder: Secondary | ICD-10-CM

## 2022-03-14 MED ORDER — HYDROXYZINE HCL 10 MG PO TABS: 10.0000 mg | ORAL_TABLET | Freq: Three times a day (TID) | ORAL | 1 refills | Status: DC | PRN

## 2022-03-14 MED ORDER — IBUPROFEN 600 MG PO TABS: 600.0000 mg | ORAL_TABLET | Freq: Three times a day (TID) | ORAL | 0 refills | Status: DC | PRN

## 2022-03-14 MED ORDER — CITALOPRAM HYDROBROMIDE 20 MG PO TABS: 20.0000 mg | ORAL_TABLET | Freq: Every day | ORAL | 2 refills | Status: DC

## 2022-03-14 NOTE — Progress Notes (Signed)
   Subjective:    Patient ID: Jody Hansen, female    DOB: Mar 30, 1995, 27 y.o.   MRN: [id number]  HPI Pt arrives due to rib pain. Pt reports purple marks on rib areas that have just popped up. Pt has been to Urgent Care multiple times and they stated it may be Costochondritis.   Pt is requesting FMLA due to lifting heavy boxes at [employer].   Pain with coughing, sneezing or hiccups. Described as jabbing pain.   Pt had has cough for a while. Urgent Care said the cough would cause rib pain.  Patient is dealing with depression and anxiety to this been coming on for a while causing her to feel depressed sad blue in addition to this anxious she would like to restart her medicines she is not suicidal.  Patient relates severe pain and discomfort in the sternum area in the right rib region She relates that lifting twisting turning bending squatting causes increased pain to the point she could not tolerate it.  Has tried some ibuprofen.     Review of Systems     Objective:   Physical Exam  General-in no acute distress Eyes-no discharge Lungs-respiratory rate normal, CTA CV-no murmurs,RRR Extremities skin warm dry no edema Neuro grossly normal Behavior normal, alert Nurse present during exam of the chest wall moderate tenderness to palpation on the right side and costochondral pain to palpation Increased pain with rotation and flexing and side-to-side movement     Assessment & Plan:  1. Costochondral chest pain Significant amount of pain she has been back-and-forth urgent care she comes here today.  Had previous x-ray.  More than likely this is musculoskeletal inflammation and discomfort.  Recommend that the patient have some time away from work she has been out of work from third we will keep her through 14 January with return to work on the 15th.  When she goes back on the week of the 15th no lifting greater than 25 pounds the following week no lifting greater than 30 pounds then on  the 29th onwards she should be at full duty If workplace cannot accommodate her we will keep her out of work  2. Depression, major, single episode, moderate ([place]) Restart Celexa patient not suicidal.  She will follow-up in a few weeks to recheck  3. GAD (generalized anxiety disorder) Celexa and hydroxyzine she will follow-up in a few weeks to recheck

## 2022-03-22 DIAGNOSIS — E88819 Insulin resistance, unspecified: Secondary | ICD-10-CM | POA: Diagnosis not present

## 2022-03-22 DIAGNOSIS — F5081 Binge eating disorder: Secondary | ICD-10-CM | POA: Diagnosis not present

## 2022-03-22 DIAGNOSIS — E559 Vitamin D deficiency, unspecified: Secondary | ICD-10-CM | POA: Diagnosis not present

## 2022-03-22 DIAGNOSIS — E669 Obesity, unspecified: Secondary | ICD-10-CM | POA: Diagnosis not present

## 2022-03-22 DIAGNOSIS — Z6837 Body mass index (BMI) 37.0-37.9, adult: Secondary | ICD-10-CM | POA: Diagnosis not present

## 2022-03-22 DIAGNOSIS — Z79899 Other long term (current) drug therapy: Secondary | ICD-10-CM | POA: Diagnosis not present

## 2022-03-28 ENCOUNTER — Ambulatory Visit: Payer: Medicaid Other | Admitting: Family Medicine

## 2022-03-28 ENCOUNTER — Encounter: Payer: Self-pay | Admitting: Family Medicine

## 2022-03-28 ENCOUNTER — Other Ambulatory Visit: Payer: Self-pay

## 2022-03-28 DIAGNOSIS — R7989 Other specified abnormal findings of blood chemistry: Secondary | ICD-10-CM | POA: Diagnosis not present

## 2022-03-28 DIAGNOSIS — F411 Generalized anxiety disorder: Secondary | ICD-10-CM

## 2022-03-28 DIAGNOSIS — F321 Major depressive disorder, single episode, moderate: Secondary | ICD-10-CM

## 2022-03-28 MED ORDER — FLUOXETINE HCL 20 MG PO CAPS: 20.0000 mg | ORAL_CAPSULE | Freq: Every day | ORAL | 1 refills | Status: DC

## 2022-03-28 NOTE — Progress Notes (Signed)
   Subjective:    Patient ID: Jody Hansen, female    DOB: 04/18/1995, 27 y.o.   MRN: [id number]  HPI Follow up costochondritis  She relates costochondritis has resolved No chest pain shortness of breath  She is dealing with moderate depression related into the stress of taking care of her son who has autism-she does best she can at managing this   Review of Systems     Objective:   Physical Exam General-in no acute distress Eyes-no discharge Lungs-respiratory rate normal, CTA CV-no murmurs,RRR Extremities skin warm dry no edema Neuro grossly normal Behavior normal, alert        Assessment & Plan:   Major depression Stop citalopram Start Prozac Recommend counseling   She has a special needs child and this causes her to have just support her child with extra care and attention on a regular basis which makes things very difficult  Costochondritis doing better Hopefully will see improvement  Patient also states that her kidney function was slightly off she was worried about that we will have her repeat a metabolic 7 when she is well-hydrated  She states she has missed a fair amount of work between stress of taking care of her autistic child, depression symptoms, and costochondritis She will be forward being FMLA  Will do a follow-up in 3 to 4 months

## 2022-03-28 NOTE — Telephone Encounter (Signed)
Front Please give her a work note for today thank you

## 2022-03-29 ENCOUNTER — Encounter: Payer: Self-pay | Admitting: Family Medicine

## 2022-03-30 ENCOUNTER — Encounter: Payer: Self-pay | Admitting: Family Medicine

## 2022-03-31 NOTE — Telephone Encounter (Signed)
Front I did dictate a letter Under Cameron's chart Please print out the letter I will sign it then please forward it to the organization The nurses are putting in a referral to this organization thank you

## 2022-03-31 NOTE — Progress Notes (Signed)
Synopsis: Referred in January 2024 for chronic cough  Subjective:   PATIENT ID: Jody Hansen GENDER: female DOB: Nov 13, 1995, MRN: [id number]   HPI  Chief Complaint  Patient presents with   Consult    Pt states she has had a dry cough x 6 - 7b months. Pt states she coughed so hard she had inflammatory and swelling on her right side and right breast bone.    Chronic cough: > has been going on for 6-7 months > Hard cough > sometimes associated with cough related micturation > has been associated with pain in her chest on the right side > dry cough, never productive of mucus > she says that she thinks there was mold in her prior residence > she developed costochondritis realted to the cough: she was given an order to rest and was out of work 1-2 weeks which helped.  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   > singing can make her cough worse, that was 2 months ago, better now  She has post nasal drip ("a lot") > she was given an Rx for Flonase, took it for a month > she hasn't taken anything else > she was told that she has something in her nose that needs to be removed  She's had some severe heartburn recently, doesn't take anything for it. > she took an acid reflux therapy several months ago: didn't help  Childhood was normal, no lung problems No smoking, vaping Mother is a smoker, brothers and sisters vape Mother smoked around her as a kid (father too)  No bronchitis or pneumonia    January 2024 PCP records reviewed with the patient was seen in the context of generalized anxiety disorder.  No mention of chronic  cough and that visit.  Past Medical History:  Diagnosis Date   Kidney stones 03/08/2015   Miscarriage    Pregnant 01/23/2015   Scoliosis      Family History  Problem Relation Age of Onset   Diabetes Paternal Grandfather    Diabetes Paternal Grandmother    Cancer Maternal Grandmother    Cancer Maternal Grandfather    Diabetes Father    Diabetes Mother    Diabetes Sister        borderline   Lung disease Paternal Aunt      Social History   Socioeconomic History   Marital status: Significant Other    Spouse name: Not on file   Number of children: 1   Years of education: Not on file   Highest education level: High school graduate  Occupational History   Occupation: call center  Tobacco Use   Smoking status: Never    Passive exposure: Past   Smokeless tobacco: Never  Vaping Use   Vaping Use: Never used  Substance and Sexual Activity   Alcohol use: No   Drug use: No   Sexual activity: Yes    Birth control/protection: Pill  Other Topics Concern   Not on file  Social History Narrative   Not on file   Social Determinants of Health  Financial Resource Strain: Low Risk  (02/11/2019)   Overall Financial Resource Strain (CARDIA)    Difficulty of Paying Living Expenses: Not hard at all  Food Insecurity: No Food Insecurity (02/11/2019)   Hunger Vital Sign    Worried About Running Out of Food in the Last Year: Never true    Ran Out of Food in the Last Year: Never true  Transportation Needs: No Transportation Needs (02/11/2019)   PRAPARE - Hydrologist (Medical): No    Lack of Transportation (Non-Medical): No  Physical Activity: Inactive (02/11/2019)   Exercise Vital Sign    Days of Exercise per Week: 0 days    Minutes of Exercise per Session: 0 min  Stress: No Stress Concern Present (02/11/2019)   [place]    Feeling of Stress : Not at all  Social Connections: Moderately  Isolated (02/11/2019)   Social Connection and Isolation Panel [NHANES]    Frequency of Communication with Friends and Family: More than three times a week    Frequency of Social Gatherings with Friends and Family: Twice a week    Attends Religious Services: Never    Marine scientist or Organizations: No    Attends Archivist Meetings: Never    Marital Status: Living with partner  Intimate Partner Violence: Not At Risk (02/11/2019)   Humiliation, Afraid, Rape, and Kick questionnaire    Fear of Current or Ex-Partner: No    Emotionally Abused: No    Physically Abused: No    Sexually Abused: No     Allergies  Allergen Reactions   Depo-Provera [Medroxyprogesterone Acetate] Other (See Comments)    Weight gain; bruising; headaches     Outpatient Medications Prior to Visit  Medication Sig Dispense Refill   FLUoxetine (PROZAC) 20 MG capsule Take 1 capsule (20 mg total) by mouth daily. 90 capsule 1   hydrOXYzine (ATARAX) 10 MG tablet Take 1 tablet (10 mg total) by mouth 3 (three) times daily as needed. 30 tablet 1   ibuprofen (ADVIL) 600 MG tablet Take 1 tablet (600 mg total) by mouth every 8 (eight) hours as needed. 30 tablet 0   levonorgestrel-ethinyl estradiol (SEASONALE) 0.15-0.03 MG tablet Take 1 tablet by mouth daily. 84 tablet 3   VYVANSE 30 MG capsule Take 30 mg by mouth daily.     No facility-administered medications prior to visit.    Review of Systems  Constitutional:  Negative for chills, fever, malaise/fatigue and weight loss.  HENT:  Positive for congestion. Negative for nosebleeds, sinus pain and sore throat.   Eyes:  Negative for photophobia, pain and discharge.  Respiratory:  Positive for cough. Negative for hemoptysis, sputum production, shortness of breath and wheezing.   Cardiovascular:  Negative for chest pain, palpitations, orthopnea and leg swelling.  Gastrointestinal:  Negative for abdominal pain, constipation, diarrhea, nausea and vomiting.   Genitourinary:  Negative for dysuria, frequency, hematuria and urgency.  Musculoskeletal:  Negative for back pain, joint pain, myalgias and neck pain.  Skin:  Negative for itching and rash.  Neurological:  Negative for tingling, tremors, sensory change, speech change, focal weakness, seizures, weakness and headaches.  Psychiatric/Behavioral:  Negative for memory loss, substance abuse and suicidal ideas. The patient is not nervous/anxious.       Objective:  Physical Exam   Vitals:   04/01/22 0948  BP: 116/68  Pulse: 95  Temp: 98.4 F (36.9 C)  TempSrc: Oral  SpO2: 98%  Weight: 204 lb (92.5 kg)  Height: 5' 2.1" (1.577 m)    Gen: well appearing, no acute distress HENT: NCAT, OP clear, neck supple without masses Eyes: PERRL, EOMi Lymph: no cervical lymphadenopathy PULM: CTA B CV: RRR, no mgr, no JVD GI: BS+, soft, nontender, no hsm Derm: no rash or skin breakdown MSK: normal bulk and tone Neuro: A&Ox4, CN II-XII intact, strength 5/5 in all 4 extremities Psyche: normal mood and affect    CBC    Component Value Date/Time   WBC 6.6 04/02/2020 1935   RBC 4.72 04/02/2020 1935   HGB 14.6 04/02/2020 1935   HGB 12.2 05/13/2019 0900   HCT 43.2 04/02/2020 1935   HCT 37.9 05/13/2019 0900   PLT 296 04/02/2020 1935   PLT 219 05/13/2019 0900   MCV 91.5 04/02/2020 1935   MCV 94 05/13/2019 0900   MCH 30.9 04/02/2020 1935   MCHC 33.8 04/02/2020 1935   RDW 12.5 04/02/2020 1935   RDW 12.7 05/13/2019 0900   LYMPHSABS 1.4 02/05/2019 1036   MONOABS 0.6 08/24/2018 2139   EOSABS 0.0 02/05/2019 1036   BASOSABS 0.0 02/05/2019 1036     Chest imaging: October 2023 two-view chest x-ray images independently reviewed showing normal pulmonary parenchyma, normal cardiac silhouette  PFT:  Labs:  Path:  Echo:  Heart Catheterization:       Assessment & Plan:   Cough, unspecified type - Plan: Nitric oxide  Chronic cough  Allergic rhinitis, unspecified seasonality,  unspecified trigger  Gastroesophageal reflux disease, unspecified whether esophagitis present  Discussion: 27 y/o female with no prior lung disease presented today with chronic cough in the setting of significant postnasal drip and recent acid reflux.  She says she has tried acid reflux therapy and it did not help with the cough, however most recently her symptoms have been associated with significant sinus congestion.  She says that her symptoms of cough have improved a little bit since she moved from her old apartment to a new 1 which was previously pretty moldy.  She is at increased risk for asthma because she grew up in an household where people smoke around her.  In general I think the likelihood of asthma is low however, she has experienced some environmental mediated change in cough which suggest an allergy type problem.  Explained to her that the differential diagnosis of chronic cough is broad and includes acid reflux, sinus congestion/allergic rhinitis, asthma, medications or ongoing smoking.  Fortunately she does not smoke or vape.  Lungs are clear on exam today and a recent chest x-ray was normal.  Plan: Chronic cough: due to allergic rhinitis and GERD Check exhaled nitric oxide today Treat allergic rhinitis, see below  Allergic rhinitis: Flonase 2 sprays each nostril daily no matter how you feel Cetirizine 10 mg daily no matter how you feel  Gastroesophageal reflux disease (GERD): Avoid fatty foods, alcohol, chocolate, caffeine and tobacco products No eating within 3 hours of bedtime Take over the counter pepcid twice daily  We will see you back in 6 weeks, if no improvement in cough by that point we can obtain lung function testing to look for evidence of an underlying lung problem.   Immunizations: Immunization History  Administered Date(s) Administered   DTaP 08/30/2000   HPV Quadrivalent 01/13/2012, 04/05/2012, 08/30/2012   Hepatitis A, Ped/Adol-2 Dose 07/11/2014    IPV 08/30/2000   Influenza-Unspecified 12/26/2011   MMR 08/30/2000, 10/11/2000   Meningococcal Conjugate 07/12/2007, 07/11/2014   Tdap 07/12/2007, 06/10/2019   Varicella  10/11/2000     Current Outpatient Medications:    cetirizine (ZYRTEC) 10 MG tablet, Take 1 tablet (10 mg total) by mouth daily., Disp: 30 tablet, Rfl: 5   FLUoxetine (PROZAC) 20 MG capsule, Take 1 capsule (20 mg total) by mouth daily., Disp: 90 capsule, Rfl: 1   fluticasone (FLONASE) 50 MCG/ACT nasal spray, Place 1 spray into both nostrils 2 (two) times daily., Disp: 16 g, Rfl: 5   hydrOXYzine (ATARAX) 10 MG tablet, Take 1 tablet (10 mg total) by mouth 3 (three) times daily as needed., Disp: 30 tablet, Rfl: 1   ibuprofen (ADVIL) 600 MG tablet, Take 1 tablet (600 mg total) by mouth every 8 (eight) hours as needed., Disp: 30 tablet, Rfl: 0   levonorgestrel-ethinyl estradiol (SEASONALE) 0.15-0.03 MG tablet, Take 1 tablet by mouth daily., Disp: 84 tablet, Rfl: 3   VYVANSE 30 MG capsule, Take 30 mg by mouth daily., Disp: , Rfl:

## 2022-04-01 ENCOUNTER — Ambulatory Visit: Payer: Medicaid Other | Admitting: Pulmonary Disease

## 2022-04-01 ENCOUNTER — Encounter: Payer: Self-pay | Admitting: Pulmonary Disease

## 2022-04-01 VITALS — BP 116/68 | HR 95 | Temp 98.4°F | Ht 62.1 in | Wt 204.0 lb

## 2022-04-01 DIAGNOSIS — R053 Chronic cough: Secondary | ICD-10-CM

## 2022-04-01 DIAGNOSIS — J309 Allergic rhinitis, unspecified: Secondary | ICD-10-CM | POA: Diagnosis not present

## 2022-04-01 DIAGNOSIS — R059 Cough, unspecified: Secondary | ICD-10-CM | POA: Diagnosis not present

## 2022-04-01 DIAGNOSIS — K219 Gastro-esophageal reflux disease without esophagitis: Secondary | ICD-10-CM | POA: Diagnosis not present

## 2022-04-01 LAB — NITRIC OXIDE: Nitric Oxide: 15

## 2022-04-01 MED ORDER — CETIRIZINE HCL 10 MG PO TABS: 10.0000 mg | ORAL_TABLET | Freq: Every day | ORAL | 5 refills | Status: DC

## 2022-04-01 MED ORDER — FLUTICASONE PROPIONATE 50 MCG/ACT NA SUSP: 1.0000 | Freq: Two times a day (BID) | NASAL | 5 refills | Status: DC

## 2022-04-01 NOTE — Patient Instructions (Signed)
Chronic cough: due to allergic rhinitis and GERD Check exhaled nitric oxide today Treat allergic rhinitis, see below  Allergic rhinitis: Flonase 2 sprays each nostril daily no matter how you feel Cetirizine 10 mg daily no matter how you feel  Gastroesophageal reflux disease (GERD): Avoid fatty foods, alcohol, chocolate, caffeine and tobacco products No eating within 3 hours of bedtime Take over the counter pepcid twice daily  We will see you back in 6 weeks, if no improvement in cough by that point we can obtain lung function testing to look for evidence of an underlying lung problem.

## 2022-04-11 ENCOUNTER — Telehealth: Payer: Self-pay

## 2022-04-11 ENCOUNTER — Encounter: Payer: Self-pay | Admitting: Family Medicine

## 2022-04-11 NOTE — Progress Notes (Signed)
This is a letter to go with the FMLA.  It is also information that we will allow staff to help fill in the FMLA.  After is failed and I will review over to see if anything else needs to be completed thank you

## 2022-04-11 NOTE — Telephone Encounter (Signed)
Jody Hansen has FMLA forms that need to be completed. Placed in Dr Nicki Reaper signature folder.  [name]

## 2022-04-11 NOTE — Telephone Encounter (Signed)
I did the initial filling out the form There is a letter dictated that will give additional information that can be filled in today FMLA After all of that is put in along with the administrative I can review it and sign thank you

## 2022-05-16 ENCOUNTER — Ambulatory Visit: Payer: Medicaid Other | Admitting: Pulmonary Disease

## 2022-06-06 ENCOUNTER — Encounter (HOSPITAL_COMMUNITY): Payer: Self-pay

## 2022-06-06 ENCOUNTER — Ambulatory Visit (HOSPITAL_COMMUNITY)
Admission: RE | Admit: 2022-06-06 | Discharge: 2022-06-06 | Disposition: A | Discharge status: Home / Self Care | Payer: Medicaid Other | Source: Ambulatory Visit | Attending: Emergency Medicine | Admitting: Emergency Medicine

## 2022-06-06 VITALS — BP 109/75 | HR 88 | Temp 98.5°F | Resp 16

## 2022-06-06 DIAGNOSIS — Z3202 Encounter for pregnancy test, result negative: Secondary | ICD-10-CM | POA: Diagnosis not present

## 2022-06-06 DIAGNOSIS — J020 Streptococcal pharyngitis: Secondary | ICD-10-CM | POA: Diagnosis not present

## 2022-06-06 LAB — POCT URINALYSIS DIPSTICK, ED / UC
Bilirubin Urine: NEGATIVE
Glucose, UA: NEGATIVE mg/dL
Hgb urine dipstick: NEGATIVE
Ketones, ur: NEGATIVE mg/dL
Leukocytes,Ua: NEGATIVE
Nitrite: NEGATIVE
Protein, ur: NEGATIVE mg/dL
Specific Gravity, Urine: >=1.03 (ref 1.005–1.030)
Urobilinogen, UA: 0.2 mg/dL (ref 0.0–1.0)
pH: 5.5 (ref 5.0–8.0)

## 2022-06-06 LAB — POC URINE PREG, ED
Preg Test, Ur: NEGATIVE
Preg Test, Ur: NEGATIVE

## 2022-06-06 LAB — POCT RAPID STREP A, ED / UC: Streptococcus, Group A Screen (Direct): POSITIVE — AB

## 2022-06-06 MED ORDER — AMOXICILLIN 500 MG PO CAPS: 500.0000 mg | ORAL_CAPSULE | Freq: Two times a day (BID) | ORAL | 0 refills | Status: DC

## 2022-06-06 NOTE — Discharge Instructions (Addendum)
Your strep test was positive. Please take the medication for the full 10 days. It's important to finish all 10 days even when you are feeling better. Otherwise the infection can come back worse. You should not have any leftover pills!!  Make sure to change your toothbrush after 1-2 days on the medicine.   I recommend tylenol and/or ibuprofen for pain control in the meantime. Drink lots of fluids!

## 2022-06-06 NOTE — ED Provider Notes (Signed)
[facility]    CSN: [id number] Arrival date & time: 06/06/22  1621      History   Chief Complaint Chief Complaint  Patient presents with   appt 63    HPI Jody Hansen is a 27 y.o. female.  Here with 2-3 days of nausea. No abd pain, vomiting or diarrhea. Son was sick recently with diarrhea.  Eating and drinking normally.  LMP unknown, on OCPs  Also developed sore throat and runny nose yesterday 6/10 pain with swallowing No fevers She took aspirin.   Past Medical History:  Diagnosis Date   Kidney stones 03/08/2015   Miscarriage    Pregnant 01/23/2015   Scoliosis     Patient Active Problem List   Diagnosis Date Noted   History of miscarriage 07/07/2021   Pregnancy examination or test, negative result 07/07/2021   Chronic midline low back pain 06/23/2021   Generalized anxiety disorder 08/07/2020   Severe episode of recurrent major depressive disorder, without psychotic features 08/07/2020   History of shoulder dystocia in prior pregnancy 09/10/2019   Postpartum depression 08/28/2019   Migraines with aura 06/02/2016   Ganglion cyst [id number]   Renal colic on right side [id number]    Past Surgical History:  Procedure Laterality Date   NO PAST SURGERIES      OB History     Gravida  4   Para  2   Term  2   Preterm      AB  2   Living  2      SAB  2   IAB      Ectopic      Multiple  0   Live Births  2            Home Medications    Prior to Admission medications   Medication Sig Start Date End Date Taking? Authorizing Provider  amoxicillin (AMOXIL) 500 MG capsule Take 1 capsule (500 mg total) by mouth 2 (two) times daily for 10 days. 06/06/22 06/16/22 Yes Yama Nielson, Wells Guiles, PA-C  ibuprofen (ADVIL) 600 MG tablet Take 1 tablet (600 mg total) by mouth every 8 (eight) hours as needed. 03/14/22   Kathyrn Drown, MD  levonorgestrel-ethinyl estradiol (SEASONALE) 0.15-0.03 MG tablet Take 1 tablet by mouth daily. 11/25/21    [employer], Trenton Gammon, FNP    Family History Family History  Problem Relation Age of Onset   Diabetes Paternal Grandfather    Diabetes Paternal Grandmother    Cancer Maternal Grandmother    Cancer Maternal Grandfather    Diabetes Father    Diabetes Mother    Diabetes Sister        borderline   Lung disease Paternal Aunt     Social History Social History   Tobacco Use   Smoking status: Never    Passive exposure: Past   Smokeless tobacco: Never  Vaping Use   Vaping Use: Never used  Substance Use Topics   Alcohol use: No   Drug use: No     Allergies   Depo-provera [medroxyprogesterone acetate]   Review of Systems Review of Systems As per HPI  Physical Exam Triage Vital Signs ED Triage Vitals  Enc Vitals Group     BP 06/06/22 1633 109/75     Pulse Rate 06/06/22 1633 88     Resp 06/06/22 1633 16     Temp 06/06/22 1633 98.5 F (36.9 C)     Temp Source 06/06/22 1633 Oral     SpO2 06/06/22  1633 97 %     Weight --      Height --      Head Circumference --      Peak Flow --      Pain Score 06/06/22 1632 6     Pain Loc --      Pain Edu? --      Excl. in [place]? --    No data found.  Updated Vital Signs BP 109/75 (BP Location: Left Arm)   Pulse 88   Temp 98.5 F (36.9 C) (Oral)   Resp 16   SpO2 97%    Physical Exam Vitals and nursing note reviewed.  Constitutional:      General: She is not in acute distress. HENT:     Nose: No rhinorrhea.     Mouth/Throat:     Mouth: Mucous membranes are moist.     Pharynx: Oropharynx is clear. No posterior oropharyngeal erythema.     Tonsils: No tonsillar exudate or tonsillar abscesses. 1+ on the right. 1+ on the left.  Eyes:     Conjunctiva/sclera: Conjunctivae normal.  Cardiovascular:     Rate and Rhythm: Normal rate and regular rhythm.     Pulses: Normal pulses.     Heart sounds: Normal heart sounds.  Pulmonary:     Effort: Pulmonary effort is normal.     Breath sounds: Normal breath sounds.  Musculoskeletal:      Cervical back: Normal range of motion.  Lymphadenopathy:     Cervical: No cervical adenopathy.  Skin:    General: Skin is warm and dry.  Neurological:     Mental Status: She is alert and oriented to person, place, and time.     UC Treatments / Results  Labs (all labs ordered are listed, but only abnormal results are displayed) Labs Reviewed  POCT RAPID STREP A, ED / UC - Abnormal; Notable for the following components:      Result Value   Streptococcus, Group A Screen (Direct) POSITIVE (*)    All other components within normal limits  POCT URINALYSIS DIPSTICK, ED / UC  POC URINE PREG, ED  POC URINE PREG, ED    EKG   Radiology No results found.  Procedures Procedures (including critical care time)  Medications Ordered in UC Medications - No data to display  Initial Impression / Assessment and Plan / UC Course  I have reviewed the triage vital signs and the nursing notes.  Pertinent labs & imaging results that were available during my care of the patient were reviewed by me and considered in my medical decision making (see chart for details).  UPT negative. UA elevated specific gravity. Recommend increase fluids.   Rapid strep positive although very normal exam Amox BID x 10 days Discussed symptomatic care in the meantime Return precautions discussed. Patient agrees to plan  Final Clinical Impressions(s) / UC Diagnoses   Final diagnoses:  Strep pharyngitis     Discharge Instructions      Your strep test was positive. Please take the medication for the full 10 days. It's important to finish all 10 days even when you are feeling better. Otherwise the infection can come back worse. You should not have any leftover pills!!  Make sure to change your toothbrush after 1-2 days on the medicine.   I recommend tylenol and/or ibuprofen for pain control in the meantime. Drink lots of fluids!     ED Prescriptions     Medication Sig Dispense Auth. Provider  amoxicillin (AMOXIL) 500 MG capsule Take 1 capsule (500 mg total) by mouth 2 (two) times daily for 10 days. 20 capsule Briyah Wheelwright, Wells Guiles, PA-C      PDMP not reviewed this encounter.   Les [place]ou, [place] 06/06/22 1737

## 2022-06-06 NOTE — ED Triage Notes (Signed)
Pt c/o nausea for a couple days. Reports sore throat and congestion since yesterday. Took aspirin today. Adds her son has been sick before her symptoms started.

## 2022-06-07 ENCOUNTER — Ambulatory Visit (HOSPITAL_COMMUNITY): Payer: Medicaid Other | Admitting: Psychiatry

## 2022-06-08 ENCOUNTER — Encounter: Payer: Self-pay | Admitting: Family Medicine

## 2022-06-08 ENCOUNTER — Ambulatory Visit
Admission: EM | Admit: 2022-06-08 | Discharge: 2022-06-08 | Disposition: A | Discharge status: Home / Self Care | Payer: Medicaid Other | Attending: Nurse Practitioner | Admitting: Nurse Practitioner

## 2022-06-08 ENCOUNTER — Encounter: Payer: Self-pay | Admitting: Emergency Medicine

## 2022-06-08 ENCOUNTER — Other Ambulatory Visit: Payer: Self-pay

## 2022-06-08 DIAGNOSIS — J069 Acute upper respiratory infection, unspecified: Secondary | ICD-10-CM

## 2022-06-08 LAB — POCT INFLUENZA A/B
Influenza A, POC: NEGATIVE
Influenza B, POC: NEGATIVE

## 2022-06-08 NOTE — ED Triage Notes (Signed)
Pt reports cough, runny nose, sneezing since Monday. Pt son tested positive for flu. Pt reports is currently being treated for strep A.

## 2022-06-08 NOTE — ED Provider Notes (Signed)
[facility]    CSN: [id number] Arrival date & time: 06/08/22  1744      History   Chief Complaint Chief Complaint  Patient presents with   Cough    Need to be tested for flu my son was positive for flu. - Entered by patient    HPI Jody Hansen is a 27 y.o. female.   The history is provided by the patient.   The patient presents for complaints of cough, runny nose, sneezing, and nausea for the past 2 days.  Patient states that her son tested positive for influenza.  She denies fever, chills, headache, nasal congestion, ear pain, wheezing, shortness of breath, difficulty breathing, vomiting, abdominal pain, or diarrhea.  Patient is currently taking amoxicillin as she was diagnosed with strep throat on 06/06/2022.  She has not taken any other medication for symptoms.  Past Medical History:  Diagnosis Date   Kidney stones 03/08/2015   Miscarriage    Pregnant 01/23/2015   Scoliosis     Patient Active Problem List   Diagnosis Date Noted   History of miscarriage 07/07/2021   Pregnancy examination or test, negative result 07/07/2021   Chronic midline low back pain 06/23/2021   Generalized anxiety disorder 08/07/2020   Severe episode of recurrent major depressive disorder, without psychotic features 08/07/2020   History of shoulder dystocia in prior pregnancy 09/10/2019   Postpartum depression 08/28/2019   Migraines with aura 06/02/2016   Ganglion cyst [id number]   Renal colic on right side [id number]    Past Surgical History:  Procedure Laterality Date   NO PAST SURGERIES      OB History     Gravida  4   Para  2   Term  2   Preterm      AB  2   Living  2      SAB  2   IAB      Ectopic      Multiple  0   Live Births  2            Home Medications    Prior to Admission medications   Medication Sig Start Date End Date Taking? Authorizing Provider  amoxicillin (AMOXIL) 500 MG capsule Take 1 capsule (500 mg total) by mouth 2  (two) times daily for 10 days. 06/06/22 06/16/22  Rising, Wells Guiles, PA-C  ibuprofen (ADVIL) 600 MG tablet Take 1 tablet (600 mg total) by mouth every 8 (eight) hours as needed. 03/14/22   Kathyrn Drown, MD  levonorgestrel-ethinyl estradiol (SEASONALE) 0.15-0.03 MG tablet Take 1 tablet by mouth daily. 11/25/21   Ameduite, Trenton Gammon, FNP    Family History Family History  Problem Relation Age of Onset   Diabetes Paternal Grandfather    Diabetes Paternal Grandmother    Cancer Maternal Grandmother    Cancer Maternal Grandfather    Diabetes Father    Diabetes Mother    Diabetes Sister        borderline   Lung disease Paternal Aunt     Social History Social History   Tobacco Use   Smoking status: Never    Passive exposure: Past   Smokeless tobacco: Never  Vaping Use   Vaping Use: Never used  Substance Use Topics   Alcohol use: No   Drug use: No     Allergies   Depo-provera [medroxyprogesterone acetate]   Review of Systems Review of Systems Per HPI  Physical Exam Triage Vital Signs ED Triage Vitals  Enc  Vitals Group     BP 06/08/22 1828 113/74     Pulse Rate 06/08/22 1828 (!) 110     Resp 06/08/22 1828 18     Temp 06/08/22 1828 98.4 F (36.9 C)     Temp Source 06/08/22 1828 Oral     SpO2 06/08/22 1828 95 %     Weight --      Height --      Head Circumference --      Peak Flow --      Pain Score 06/08/22 1825 4     Pain Loc --      Pain Edu? --      Excl. in [place]? --    No data found.  Updated Vital Signs BP 113/74 (BP Location: Right Arm)   Pulse (!) 110   Temp 98.4 F (36.9 C) (Oral)   Resp 18   LMP 05/29/2022 (Approximate)   SpO2 95%   Visual Acuity Right Eye Distance:   Left Eye Distance:   Bilateral Distance:    Right Eye Near:   Left Eye Near:    Bilateral Near:     Physical Exam Vitals and nursing note reviewed.  Constitutional:      General: She is not in acute distress.    Appearance: Normal appearance. She is well-developed.  HENT:      Head: Normocephalic.     Right Ear: Tympanic membrane, ear canal and external ear normal.     Left Ear: Tympanic membrane, ear canal and external ear normal.     Nose: Congestion present.     Right Turbinates: Enlarged and swollen.     Left Turbinates: Enlarged and swollen.     Right Sinus: No maxillary sinus tenderness or frontal sinus tenderness.     Left Sinus: No maxillary sinus tenderness or frontal sinus tenderness.     Mouth/Throat:     Lips: Pink.     Mouth: Mucous membranes are moist.     Pharynx: Uvula midline. Pharyngeal swelling and posterior oropharyngeal erythema present. No oropharyngeal exudate.     Tonsils: 1+ on the right. 1+ on the left.  Eyes:     Extraocular Movements: Extraocular movements intact.     Conjunctiva/sclera: Conjunctivae normal.     Pupils: Pupils are equal, round, and reactive to light.  Cardiovascular:     Rate and Rhythm: Regular rhythm. Tachycardia present.     Heart sounds: Normal heart sounds.  Pulmonary:     Effort: Pulmonary effort is normal.     Breath sounds: Normal breath sounds.  Abdominal:     General: Bowel sounds are normal. There is no distension.     Palpations: Abdomen is soft.     Tenderness: There is no abdominal tenderness. There is no guarding or rebound.  Genitourinary:    Vagina: Normal. No vaginal discharge.  Musculoskeletal:     Cervical back: Normal range of motion.  Lymphadenopathy:     Cervical: No cervical adenopathy.  Skin:    General: Skin is warm and dry.     Findings: No erythema or rash.  Neurological:     General: No focal deficit present.     Mental Status: She is alert and oriented to person, place, and time.     Cranial Nerves: No cranial nerve deficit.  Psychiatric:        Mood and Affect: Mood normal.        Behavior: Behavior normal.      UC Treatments /  Results  Labs (all labs ordered are listed, but only abnormal results are displayed) Labs Reviewed  POCT INFLUENZA A/B     EKG   Radiology No results found.  Procedures Procedures (including critical care time)  Medications Ordered in UC Medications - No data to display  Initial Impression / Assessment and Plan / UC Course  I have reviewed the triage vital signs and the nursing notes.  Pertinent labs & imaging results that were available during my care of the patient were reviewed by me and considered in my medical decision making (see chart for details).  Patient is well-appearing, she is in no acute distress, vital signs are stable.  Influenza test was negative.  Patient is currently taking amoxicillin for strep throat.  Supportive care recommendations were provided and discussed with the patient to include increasing fluids, allowing for plenty of rest, and over-the-counter analgesics for pain or discomfort.  Patient was given indications of when follow-up may be necessary.  Patient is in agreement with this plan of care and verbalizes understanding.  All questions were answered.  Patient stable for discharge.   Final Clinical Impressions(s) / UC Diagnoses   Final diagnoses:  Viral upper respiratory tract infection with cough     Discharge Instructions      The influenza test was negative. Take over-the-counter medications for cough such as Robitussin, Mucinex, DayQuil, or NyQuil. Continue the antibiotic that you are currently taking for strep throat. Increase fluids and allow for plenty of rest. May take over-the-counter Tylenol or ibuprofen as needed for pain, fever, or general discomfort. Recommend using a humidifier in your bedroom at nighttime during sleep and sleeping elevated on pillows while cough symptoms persist. Recommend normal saline nasal spray to help with nasal congestion and runny nose. Please be advised, a viral illness can last from 7 to 14 days.  If your symptoms worsen at that time, or extend beyond that time, please follow-up with your primary care physician for  further evaluation. Follow-up as needed.     ED Prescriptions   None    PDMP not reviewed this encounter.   Tish Men, NP 06/08/22 [phone]

## 2022-06-08 NOTE — Discharge Instructions (Addendum)
The influenza test was negative. Take over-the-counter medications for cough such as Robitussin, Mucinex, DayQuil, or NyQuil. Continue the antibiotic that you are currently taking for strep throat. Increase fluids and allow for plenty of rest. May take over-the-counter Tylenol or ibuprofen as needed for pain, fever, or general discomfort. Recommend using a humidifier in your bedroom at nighttime during sleep and sleeping elevated on pillows while cough symptoms persist. Recommend normal saline nasal spray to help with nasal congestion and runny nose. Please be advised, a viral illness can last from 7 to 14 days.  If your symptoms worsen at that time, or extend beyond that time, please follow-up with your primary care physician for further evaluation. Follow-up as needed.

## 2022-06-09 ENCOUNTER — Encounter (HOSPITAL_COMMUNITY): Payer: Self-pay | Admitting: Emergency Medicine

## 2022-06-09 ENCOUNTER — Other Ambulatory Visit: Payer: Self-pay

## 2022-06-09 ENCOUNTER — Ambulatory Visit (HOSPITAL_COMMUNITY)
Admission: EM | Admit: 2022-06-09 | Discharge: 2022-06-09 | Disposition: A | Discharge status: Home / Self Care | Payer: Medicaid Other | Attending: Family Medicine | Admitting: Family Medicine

## 2022-06-09 DIAGNOSIS — J069 Acute upper respiratory infection, unspecified: Secondary | ICD-10-CM

## 2022-06-09 DIAGNOSIS — J111 Influenza due to unidentified influenza virus with other respiratory manifestations: Secondary | ICD-10-CM

## 2022-06-09 DIAGNOSIS — J020 Streptococcal pharyngitis: Secondary | ICD-10-CM

## 2022-06-09 DIAGNOSIS — Z1152 Encounter for screening for COVID-19: Secondary | ICD-10-CM | POA: Diagnosis not present

## 2022-06-09 MED ORDER — BENZONATATE 100 MG PO CAPS: 100.0000 mg | ORAL_CAPSULE | Freq: Three times a day (TID) | ORAL | 0 refills | Status: DC | PRN

## 2022-06-09 MED ORDER — OSELTAMIVIR PHOSPHATE 75 MG PO CAPS: 75.0000 mg | ORAL_CAPSULE | Freq: Two times a day (BID) | ORAL | 0 refills | Status: DC

## 2022-06-09 MED ORDER — ONDANSETRON 4 MG PO TBDP: 4.0000 mg | ORAL_TABLET | Freq: Three times a day (TID) | ORAL | 0 refills | Status: DC | PRN

## 2022-06-09 MED ORDER — AMOXICILLIN 500 MG PO CAPS: 500.0000 mg | ORAL_CAPSULE | Freq: Two times a day (BID) | ORAL | 0 refills | Status: AC

## 2022-06-09 NOTE — ED Provider Notes (Signed)
[facility]    CSN: [id number] Arrival date & time: 06/09/22  1155      History   Chief Complaint Chief Complaint  Patient presents with   Cough    HPI Jody Hansen is a 27 y.o. female.    Cough  Here for cough and low-grade fever and nausea.  Symptoms began on April 3, yesterday.  She was seen here April 1 and diagnosed with strep with a positive strep test.  She started her amoxicillin today.  It is prescribed 500 mg twice daily.  Yesterday she dropped the bottle on the ground in the dirt and does not want to take any more of that prescription and would like a new one.  Her son tested positive for the flu yesterday.  Last menstrual cycle was in the last 60 days.  She takes continuous birth control every 90 days.    Past Medical History:  Diagnosis Date   Kidney stones 03/08/2015   Miscarriage    Pregnant 01/23/2015   Scoliosis     Patient Active Problem List   Diagnosis Date Noted   History of miscarriage 07/07/2021   Pregnancy examination or test, negative result 07/07/2021   Chronic midline low back pain 06/23/2021   Generalized anxiety disorder 08/07/2020   Severe episode of recurrent major depressive disorder, without psychotic features 08/07/2020   History of shoulder dystocia in prior pregnancy 09/10/2019   Postpartum depression 08/28/2019   Migraines with aura 06/02/2016   Ganglion cyst [id number]   Renal colic on right side [id number]    Past Surgical History:  Procedure Laterality Date   NO PAST SURGERIES      OB History     Gravida  4   Para  2   Term  2   Preterm      AB  2   Living  2      SAB  2   IAB      Ectopic      Multiple  0   Live Births  2            Home Medications    Prior to Admission medications   Medication Sig Start Date End Date Taking? Authorizing Provider  benzonatate (TESSALON) 100 MG capsule Take 1 capsule (100 mg total) by mouth 3 (three) times daily as needed for cough.  06/09/22  Yes Barrett Henle, MD  ondansetron (ZOFRAN-ODT) 4 MG disintegrating tablet Take 1 tablet (4 mg total) by mouth every 8 (eight) hours as needed for nausea or vomiting. 06/09/22  Yes Barrett Henle, MD  oseltamivir (TAMIFLU) 75 MG capsule Take 1 capsule (75 mg total) by mouth every 12 (twelve) hours. 06/09/22  Yes Keana Dueitt, Gwenlyn Perking, MD  amoxicillin (AMOXIL) 500 MG capsule Take 1 capsule (500 mg total) by mouth 2 (two) times daily for 8 days. 06/09/22 06/17/22  Barrett Henle, MD  ibuprofen (ADVIL) 600 MG tablet Take 1 tablet (600 mg total) by mouth every 8 (eight) hours as needed. 03/14/22   Kathyrn Drown, MD  levonorgestrel-ethinyl estradiol (SEASONALE) 0.15-0.03 MG tablet Take 1 tablet by mouth daily. 11/25/21   Ameduite, Trenton Gammon, FNP    Family History Family History  Problem Relation Age of Onset   Diabetes Paternal Grandfather    Diabetes Paternal Grandmother    Cancer Maternal Grandmother    Cancer Maternal Grandfather    Diabetes Father    Diabetes Mother    Diabetes Sister  borderline   Lung disease Paternal Aunt     Social History Social History   Tobacco Use   Smoking status: Never    Passive exposure: Past   Smokeless tobacco: Never  Vaping Use   Vaping Use: Never used  Substance Use Topics   Alcohol use: No   Drug use: No     Allergies   Depo-provera [medroxyprogesterone acetate]   Review of Systems Review of Systems  Respiratory:  Positive for cough.      Physical Exam Triage Vital Signs ED Triage Vitals  Enc Vitals Group     BP 06/09/22 1222 121/81     Pulse Rate 06/09/22 1222 (!) 116     Resp 06/09/22 1222 20     Temp 06/09/22 1222 99.2 F (37.3 C)     Temp Source 06/09/22 1222 Oral     SpO2 06/09/22 1222 95 %     Weight --      Height --      Head Circumference --      Peak Flow --      Pain Score 06/09/22 1210 6     Pain Loc --      Pain Edu? --      Excl. in [place]? --    No data found.  Updated Vital Signs BP  121/81 (BP Location: Right Arm)   Pulse (!) 116   Temp 99.2 F (37.3 C) (Oral)   Resp 20   LMP 05/29/2022 (Approximate)   SpO2 95%   Visual Acuity Right Eye Distance:   Left Eye Distance:   Bilateral Distance:    Right Eye Near:   Left Eye Near:    Bilateral Near:     Physical Exam Vitals reviewed.  Constitutional:      General: She is not in acute distress.    Appearance: She is not toxic-appearing.  HENT:     Right Ear: Tympanic membrane and ear canal normal.     Left Ear: Tympanic membrane and ear canal normal.     Nose: Congestion present.     Mouth/Throat:     Mouth: Mucous membranes are moist.     Pharynx: No oropharyngeal exudate or posterior oropharyngeal erythema.  Eyes:     Extraocular Movements: Extraocular movements intact.     Conjunctiva/sclera: Conjunctivae normal.     Pupils: Pupils are equal, round, and reactive to light.  Cardiovascular:     Rate and Rhythm: Normal rate and regular rhythm.     Heart sounds: No murmur heard. Pulmonary:     Effort: Pulmonary effort is normal. No respiratory distress.     Breath sounds: No stridor. No wheezing, rhonchi or rales.  Abdominal:     Palpations: Abdomen is soft.     Tenderness: There is no abdominal tenderness.  Musculoskeletal:     Cervical back: Neck supple.  Lymphadenopathy:     Cervical: No cervical adenopathy.  Skin:    Capillary Refill: Capillary refill takes less than 2 seconds.     Coloration: Skin is not jaundiced or pale.  Neurological:     General: No focal deficit present.     Mental Status: She is alert and oriented to person, place, and time.  Psychiatric:        Behavior: Behavior normal.      UC Treatments / Results  Labs (all labs ordered are listed, but only abnormal results are displayed) Labs Reviewed  SARS CORONAVIRUS 2 (TAT 6-24 HRS)    EKG  Radiology No results found.  Procedures Procedures (including critical care time)  Medications Ordered in UC Medications  - No data to display  Initial Impression / Assessment and Plan / UC Course  I have reviewed the triage vital signs and the nursing notes.  Pertinent labs & imaging results that were available during my care of the patient were reviewed by me and considered in my medical decision making (see chart for details).        With her exposure known to her son who has the flu, then to treat for influenza-like illness with Tamiflu.  Since her temperature is not that high, I am going to still swab for COVID.  If positive, she is a candidate for Paxlovid as she has an elevated BMI.  Her last EGFR was greater than 60  Also I am going to send in the balance of the amoxicillin that she needs to treat her streptococcal pharyngitis. Final Clinical Impressions(s) / UC Diagnoses   Final diagnoses:  Viral URI with cough  Influenza-like illness  Strep pharyngitis     Discharge Instructions      Take oseltamivir 75 mg--1 capsule 2 times daily for 5 days  Ondansetron dissolved in the mouth every 8 hours as needed for nausea or vomiting. Clear liquids and bland things to eat. Avoid acidic foods like lemon/lime/orange/tomato.  Take benzonatate 100 mg, 1 tab every 8 hours as needed for cough.   You have been swabbed for COVID, and the test will result in the next 24 hours. Our staff will call you if positive. If the COVID test is positive, you should quarantine until you are fever free for 24 hours and you are starting to feel better, and then take added precautions for the next 5 days, such as physical distancing/wearing a mask and good hand hygiene/washing. Stop the oseltamivir if your covid is positive.  Finish taking amoxicillin 500 mg 2 times a day for 8 more days.     ED Prescriptions     Medication Sig Dispense Auth. Provider   amoxicillin (AMOXIL) 500 MG capsule Take 1 capsule (500 mg total) by mouth 2 (two) times daily for 8 days. 16 capsule Barrett Henle, MD   oseltamivir  (TAMIFLU) 75 MG capsule Take 1 capsule (75 mg total) by mouth every 12 (twelve) hours. 10 capsule Barrett Henle, MD   benzonatate (TESSALON) 100 MG capsule Take 1 capsule (100 mg total) by mouth 3 (three) times daily as needed for cough. 21 capsule Barrett Henle, MD   ondansetron (ZOFRAN-ODT) 4 MG disintegrating tablet Take 1 tablet (4 mg total) by mouth every 8 (eight) hours as needed for nausea or vomiting. 10 tablet Windy Carina Gwenlyn Perking, MD      PDMP not reviewed this encounter.   Barrett Henle, MD 06/09/22 [phone]

## 2022-06-09 NOTE — ED Triage Notes (Signed)
Symptoms started 2 days ago with a cough.  Since last night had chills, fever 99.6,stuffy nose, runny nose, and forehead pain and difficulty sleeping and generalized body aches.  Son tested positive for flu on Wednesday.      Patient has taken robitussin dm.

## 2022-06-09 NOTE — ED Triage Notes (Signed)
[facility] was seen [facility] at [facility] and tested positive for strep and prescribed antibiotic

## 2022-06-09 NOTE — Discharge Instructions (Signed)
Take oseltamivir 75 mg--1 capsule 2 times daily for 5 days  Ondansetron dissolved in the mouth every 8 hours as needed for nausea or vomiting. Clear liquids and bland things to eat. Avoid acidic foods like lemon/lime/orange/tomato.  Take benzonatate 100 mg, 1 tab every 8 hours as needed for cough.   You have been swabbed for COVID, and the test will result in the next 24 hours. Our staff will call you if positive. If the COVID test is positive, you should quarantine until you are fever free for 24 hours and you are starting to feel better, and then take added precautions for the next 5 days, such as physical distancing/wearing a mask and good hand hygiene/washing. Stop the oseltamivir if your covid is positive.  Finish taking amoxicillin 500 mg 2 times a day for 8 more days.

## 2022-06-10 LAB — SARS CORONAVIRUS 2 (TAT 6-24 HRS): SARS Coronavirus 2: NEGATIVE

## 2022-06-14 ENCOUNTER — Encounter: Payer: Self-pay | Admitting: Family Medicine

## 2022-06-19 DIAGNOSIS — R5383 Other fatigue: Secondary | ICD-10-CM | POA: Diagnosis not present

## 2022-06-19 DIAGNOSIS — E559 Vitamin D deficiency, unspecified: Secondary | ICD-10-CM | POA: Diagnosis not present

## 2022-06-19 DIAGNOSIS — Z6837 Body mass index (BMI) 37.0-37.9, adult: Secondary | ICD-10-CM | POA: Diagnosis not present

## 2022-06-19 DIAGNOSIS — E8881 Metabolic syndrome: Secondary | ICD-10-CM | POA: Diagnosis not present

## 2022-06-19 DIAGNOSIS — R5381 Other malaise: Secondary | ICD-10-CM | POA: Diagnosis not present

## 2022-06-19 DIAGNOSIS — E7800 Pure hypercholesterolemia, unspecified: Secondary | ICD-10-CM | POA: Diagnosis not present

## 2022-06-19 DIAGNOSIS — F32A Depression, unspecified: Secondary | ICD-10-CM | POA: Diagnosis not present

## 2022-06-19 DIAGNOSIS — F419 Anxiety disorder, unspecified: Secondary | ICD-10-CM | POA: Diagnosis not present

## 2022-06-19 DIAGNOSIS — Z3200 Encounter for pregnancy test, result unknown: Secondary | ICD-10-CM | POA: Diagnosis not present

## 2022-06-19 DIAGNOSIS — Z131 Encounter for screening for diabetes mellitus: Secondary | ICD-10-CM | POA: Diagnosis not present

## 2022-06-19 DIAGNOSIS — R0602 Shortness of breath: Secondary | ICD-10-CM | POA: Diagnosis not present

## 2022-06-19 DIAGNOSIS — E88819 Insulin resistance, unspecified: Secondary | ICD-10-CM | POA: Diagnosis not present

## 2022-06-19 DIAGNOSIS — Z0130 Encounter for examination of blood pressure without abnormal findings: Secondary | ICD-10-CM | POA: Diagnosis not present

## 2022-06-19 DIAGNOSIS — Z6838 Body mass index (BMI) 38.0-38.9, adult: Secondary | ICD-10-CM | POA: Diagnosis not present

## 2022-06-27 NOTE — Telephone Encounter (Signed)
A letter was dictated under the child's chart please fax as requested and send mother notification  Also please send mother information that would allow her to establish a MyChart account for her child for future messages regarding Jody Hansen  Thank you-Dr. Lilyan Punt

## 2022-06-28 ENCOUNTER — Encounter (HOSPITAL_COMMUNITY): Payer: Self-pay

## 2022-06-28 ENCOUNTER — Ambulatory Visit (HOSPITAL_COMMUNITY)
Admission: RE | Admit: 2022-06-28 | Discharge: 2022-06-28 | Disposition: A | Discharge status: Home / Self Care | Payer: Medicaid Other | Source: Ambulatory Visit | Attending: Family Medicine | Admitting: Family Medicine

## 2022-06-28 VITALS — BP 106/72 | HR 77 | Temp 98.6°F | Resp 16

## 2022-06-28 DIAGNOSIS — K529 Noninfective gastroenteritis and colitis, unspecified: Secondary | ICD-10-CM

## 2022-06-28 MED ORDER — DICYCLOMINE HCL 20 MG PO TABS: 20.0000 mg | ORAL_TABLET | Freq: Four times a day (QID) | ORAL | 0 refills | Status: DC | PRN

## 2022-06-28 MED ORDER — ONDANSETRON 4 MG PO TBDP: 4.0000 mg | ORAL_TABLET | Freq: Three times a day (TID) | ORAL | 0 refills | Status: DC | PRN

## 2022-06-28 NOTE — ED Triage Notes (Addendum)
Pt woke up at 3am with n/v/d. Reports abdomen feels like it is being squeezed. Denies urinary problems. Tried taking Zofran ODT but still vomited. Pt reports since yesterday burping and "taste like eggs, but I havent eggs in long time".

## 2022-06-28 NOTE — Discharge Instructions (Signed)
Ondansetron 4 mg--1-2 tablets dissolved in the mouth every 8 hours as needed for nausea or vomiting. Clear liquids and bland things to eat. Avoid acidic foods like lemon/lime/orange/tomato.  Dicyclomine--take 1 every 6 hours as needed for intestinal cramps

## 2022-06-28 NOTE — ED Provider Notes (Signed)
[facility]    CSN: [id number] Arrival date & time: 06/28/22  1646      History   Chief Complaint Chief Complaint  Patient presents with   appt 5   Abdominal Pain   Emesis    HPI Jody Hansen is a 27 y.o. female.    Abdominal Pain Associated symptoms: vomiting   Emesis Associated symptoms: abdominal pain    Here for nausea and vomiting and diarrhea.  She has also had abdominal pain.  Symptoms began this morning about 3, that is about 14 hours ago.  She has had epigastric pain and last had any vomiting or diarrhea this morning.  She continues to have nausea and Zofran ODT did not help much.  No fever or chills.  Last menstrual cycle was March 24.    Past Medical History:  Diagnosis Date   Kidney stones 03/08/2015   Miscarriage    Pregnant 01/23/2015   Scoliosis     Patient Active Problem List   Diagnosis Date Noted   History of miscarriage 07/07/2021   Pregnancy examination or test, negative result 07/07/2021   Chronic midline low back pain 06/23/2021   Generalized anxiety disorder 08/07/2020   Severe episode of recurrent major depressive disorder, without psychotic features 08/07/2020   History of shoulder dystocia in prior pregnancy 09/10/2019   Postpartum depression 08/28/2019   Migraines with aura 06/02/2016   Ganglion cyst 08/10/2015   Renal colic on right side 07/03/2012    Past Surgical History:  Procedure Laterality Date   NO PAST SURGERIES      OB History     Gravida  4   Para  2   Term  2   Preterm      AB  2   Living  2      SAB  2   IAB      Ectopic      Multiple  0   Live Births  2            Home Medications    Prior to Admission medications   Medication Sig Start Date End Date Taking? Authorizing Provider  dicyclomine (BENTYL) 20 MG tablet Take 1 tablet (20 mg total) by mouth 4 (four) times daily as needed (intestinal cramps). 06/28/22  Yes Zenia Resides, MD  ondansetron (ZOFRAN-ODT) 4  MG disintegrating tablet Take 1-2 tablets (4-8 mg total) by mouth every 8 (eight) hours as needed for nausea or vomiting. 06/28/22  Yes Zenia Resides, MD  levonorgestrel-ethinyl estradiol (SEASONALE) 0.15-0.03 MG tablet Take 1 tablet by mouth daily. 11/25/21   Ameduite, Alvino Chapel, FNP    Family History Family History  Problem Relation Age of Onset   Diabetes Paternal Grandfather    Diabetes Paternal Grandmother    Cancer Maternal Grandmother    Cancer Maternal Grandfather    Diabetes Father    Diabetes Mother    Diabetes Sister        borderline   Lung disease Paternal Aunt     Social History Social History   Tobacco Use   Smoking status: Never    Passive exposure: Past   Smokeless tobacco: Never  Vaping Use   Vaping Use: Never used  Substance Use Topics   Alcohol use: No   Drug use: No     Allergies   Depo-provera [medroxyprogesterone acetate]   Review of Systems Review of Systems  Gastrointestinal:  Positive for abdominal pain and vomiting.     Physical  Exam Triage Vital Signs ED Triage Vitals  Enc Vitals Group     BP 06/28/22 1721 106/72     Pulse Rate 06/28/22 1721 77     Resp 06/28/22 1721 16     Temp 06/28/22 1721 98.6 F (37 C)     Temp Source 06/28/22 1721 Oral     SpO2 06/28/22 1721 96 %     Weight --      Height --      Head Circumference --      Peak Flow --      Pain Score 06/28/22 1719 6     Pain Loc --      Pain Edu? --      Excl. in GC? --    No data found.  Updated Vital Signs BP 106/72 (BP Location: Right Arm)   Pulse 77   Temp 98.6 F (37 C) (Oral)   Resp 16   LMP 05/29/2022 (Approximate)   SpO2 96%   Visual Acuity Right Eye Distance:   Left Eye Distance:   Bilateral Distance:    Right Eye Near:   Left Eye Near:    Bilateral Near:     Physical Exam Vitals reviewed.  Constitutional:      General: She is not in acute distress.    Appearance: She is not ill-appearing, toxic-appearing or diaphoretic.  HENT:      Mouth/Throat:     Mouth: Mucous membranes are moist.  Eyes:     Extraocular Movements: Extraocular movements intact.     Conjunctiva/sclera: Conjunctivae normal.     Pupils: Pupils are equal, round, and reactive to light.  Cardiovascular:     Rate and Rhythm: Normal rate and regular rhythm.     Heart sounds: No murmur heard. Pulmonary:     Effort: Pulmonary effort is normal.     Breath sounds: Normal breath sounds.  Abdominal:     General: Bowel sounds are normal. There is no distension.     Palpations: Abdomen is soft. There is no mass.     Tenderness: There is no abdominal tenderness. There is no guarding.  Musculoskeletal:     Cervical back: Neck supple.  Lymphadenopathy:     Cervical: No cervical adenopathy.  Skin:    Coloration: Skin is not jaundiced or pale.  Neurological:     General: No focal deficit present.     Mental Status: She is alert and oriented to person, place, and time.      UC Treatments / Results  Labs (all labs ordered are listed, but only abnormal results are displayed) Labs Reviewed - No data to display  EKG   Radiology No results found.  Procedures Procedures (including critical care time)  Medications Ordered in UC Medications - No data to display  Initial Impression / Assessment and Plan / UC Course  I have reviewed the triage vital signs and the nursing notes.  Pertinent labs & imaging results that were available during my care of the patient were reviewed by me and considered in my medical decision making (see chart for details).     She declined my offer of an IM injection of Zofran.  I did offer this because the 4 mg of Zofran has not helped much.  Zofran is sent in so that she can take 1 or 2 at a time as needed for nausea.  Bentyl is sent in for the cramping.  Clear liquids and a bland diet. Final Clinical Impressions(s) / UC  Diagnoses   Final diagnoses:  Gastroenteritis     Discharge Instructions      Ondansetron 4  mg--1-2 tablets dissolved in the mouth every 8 hours as needed for nausea or vomiting. Clear liquids and bland things to eat. Avoid acidic foods like lemon/lime/orange/tomato.  Dicyclomine--take 1 every 6 hours as needed for intestinal cramps      ED Prescriptions     Medication Sig Dispense Auth. Provider   ondansetron (ZOFRAN-ODT) 4 MG disintegrating tablet Take 1-2 tablets (4-8 mg total) by mouth every 8 (eight) hours as needed for nausea or vomiting. 10 tablet Zenia Resides, MD   dicyclomine (BENTYL) 20 MG tablet Take 1 tablet (20 mg total) by mouth 4 (four) times daily as needed (intestinal cramps). 20 tablet Makaveli Hoard, Janace Aris, MD      PDMP not reviewed this encounter.   Zenia Resides, MD 06/28/22 [phone]

## 2022-07-08 ENCOUNTER — Other Ambulatory Visit: Payer: Self-pay

## 2022-07-08 ENCOUNTER — Emergency Department (HOSPITAL_COMMUNITY)
Admission: EM | Admit: 2022-07-08 | Discharge: 2022-07-08 | Disposition: A | Discharge status: Home / Self Care | Payer: Medicaid Other | Attending: Emergency Medicine | Admitting: Emergency Medicine

## 2022-07-08 ENCOUNTER — Emergency Department (HOSPITAL_COMMUNITY): Payer: Medicaid Other

## 2022-07-08 DIAGNOSIS — R1013 Epigastric pain: Secondary | ICD-10-CM | POA: Diagnosis not present

## 2022-07-08 DIAGNOSIS — R197 Diarrhea, unspecified: Secondary | ICD-10-CM | POA: Insufficient documentation

## 2022-07-08 LAB — CBC WITH DIFFERENTIAL/PLATELET
Abs Immature Granulocytes: 0.03 10*3/uL (ref 0.00–0.07)
Basophils Absolute: 0 10*3/uL (ref 0.0–0.1)
Basophils Relative: 0 %
Eosinophils Absolute: 0.1 10*3/uL (ref 0.0–0.5)
Eosinophils Relative: 2 %
HCT: 44.1 % (ref 36.0–46.0)
Hemoglobin: 14.7 g/dL (ref 12.0–15.0)
Immature Granulocytes: 0 %
Lymphocytes Relative: 22 %
Lymphs Abs: 1.5 10*3/uL (ref 0.7–4.0)
MCH: 30.5 pg (ref 26.0–34.0)
MCHC: 33.3 g/dL (ref 30.0–36.0)
MCV: 91.5 fL (ref 80.0–100.0)
Monocytes Absolute: 0.5 10*3/uL (ref 0.1–1.0)
Monocytes Relative: 8 %
Neutro Abs: 4.5 10*3/uL (ref 1.7–7.7)
Neutrophils Relative %: 68 %
Platelets: 255 10*3/uL (ref 150–400)
RBC: 4.82 MIL/uL (ref 3.87–5.11)
RDW: 11.8 % (ref 11.5–15.5)
WBC: 6.8 10*3/uL (ref 4.0–10.5)
nRBC: 0 % (ref 0.0–0.2)

## 2022-07-08 LAB — URINALYSIS, ROUTINE W REFLEX MICROSCOPIC
Bilirubin Urine: NEGATIVE
Glucose, UA: NEGATIVE mg/dL
Hgb urine dipstick: NEGATIVE
Ketones, ur: NEGATIVE mg/dL
Leukocytes,Ua: NEGATIVE
Nitrite: NEGATIVE
Protein, ur: NEGATIVE mg/dL
Specific Gravity, Urine: 1.008 (ref 1.005–1.030)
pH: 6 (ref 5.0–8.0)

## 2022-07-08 LAB — GASTROINTESTINAL PANEL BY PCR, STOOL (REPLACES STOOL CULTURE)

## 2022-07-08 LAB — COMPREHENSIVE METABOLIC PANEL
ALT: 31 U/L (ref 0–44)
AST: 28 U/L (ref 15–41)
Albumin: 3.9 g/dL (ref 3.5–5.0)
Alkaline Phosphatase: 54 U/L (ref 38–126)
Anion gap: 9 (ref 5–15)
BUN: 10 mg/dL (ref 6–20)
CO2: 21 mmol/L — ABNORMAL LOW (ref 22–32)
Calcium: 8.6 mg/dL — ABNORMAL LOW (ref 8.9–10.3)
Chloride: 107 mmol/L (ref 98–111)
Creatinine, Ser: 0.81 mg/dL (ref 0.44–1.00)
GFR, Estimated: >60 mL/min (ref >60)
Glucose, Bld: 96 mg/dL (ref 70–99)
Potassium: 4.1 mmol/L (ref 3.5–5.1)
Sodium: 137 mmol/L (ref 135–145)
Total Bilirubin: 0.3 mg/dL (ref 0.3–1.2)
Total Protein: 7.2 g/dL (ref 6.5–8.1)

## 2022-07-08 LAB — C DIFFICILE QUICK SCREEN W PCR REFLEX
C Diff antigen: NEGATIVE
C Diff interpretation: NEGATIVE
C Diff toxin: NEGATIVE

## 2022-07-08 LAB — LIPASE, BLOOD: Lipase: 47 U/L (ref 11–51)

## 2022-07-08 LAB — PREGNANCY, URINE: Preg Test, Ur: NEGATIVE

## 2022-07-08 MED ORDER — ONDANSETRON HCL 4 MG/2ML IJ SOLN: 4.0000 mg | Freq: Once | INTRAMUSCULAR | Status: DC

## 2022-07-08 MED ORDER — DICYCLOMINE HCL 10 MG/ML IM SOLN
20.0000 mg | Freq: Once | INTRAMUSCULAR | Status: AC
  Administered 2022-07-08: 20 mg via INTRAMUSCULAR
  Filled 2022-07-08: qty 2

## 2022-07-08 MED ORDER — LACTATED RINGERS IV BOLUS
1000.0000 mL | Freq: Once | INTRAVENOUS | Status: AC
  Administered 2022-07-08: 1000 mL via INTRAVENOUS

## 2022-07-08 NOTE — ED Notes (Signed)
US at bedside

## 2022-07-08 NOTE — Discharge Instructions (Addendum)
Your blood work is reassuring.  Check your stool study results online via MyChart and follow-up with your doctor.  You will be called if you need antibiotics.  Your gallbladder appears to have a polyp as seen on ultrasound.  This should be checked again in 6 months to make sure it is not changing in size.  Alternatively, you may schedule an appointment with the surgery team for further assessment of your gallbladder. Keep yourself hydrated.  Start with a clear liquid diet and advance slowly as tolerated.  Return to the ED with new or worsening symptoms including abdominal pain, fever, vomiting, unable to eat or drink or other concerns.

## 2022-07-08 NOTE — ED Triage Notes (Signed)
Pt. Stated, Jody Hansen had stomach pain with N/V /D for over a week.  I went to UC last week and they said if it persist to come to ED. Ive also had a cough for over a year that I want yall to check out .

## 2022-07-08 NOTE — ED Notes (Signed)
Patient reports no pain after medication and had no issues with nausea or vomiting after food.

## 2022-07-08 NOTE — ED Provider Notes (Signed)
[place] EMERGENCY DEPARTMENT AT [facility] Provider Note   CSN: [id number] Arrival date & time: 07/08/22  6440     History  Chief Complaint  Patient presents with   Abdominal Pain   Nausea   Emesis   Diarrhea   Cough    Jody Hansen is a 27 y.o. female.  Patient with no chronic medical conditions presenting with diarrhea, abdominal cramping and nausea since yesterday.  States she woke up this morning with epigastric pain and greater than 12 episodes of loose green and yellow stools.  Stools are nonbloody.  She has had nausea but no vomiting.  Having upper abdominal pain in her epigastrium.  Had similar symptoms on April 23 and was told she had a gastroenteritis.  Symptoms did improve again until yesterday.  Fianc at home with similar symptoms.  No travel or sick contacts.  No fever, chills, pain with urination, blood in the urine, vaginal bleeding or discharge.  Still has appendix and gallbladder.  No history of ulcers or GERD.  States symptoms did not improve since she was seen in urgent care became worse again yesterday.  Does take birth control.  No normal vaginal bleeding or discharge.  No recent antibiotic use.  The history is provided by the patient.  Abdominal Pain Associated symptoms: cough, diarrhea, fatigue and vomiting   Associated symptoms: no chest pain, no dysuria and no hematuria   Emesis Associated symptoms: abdominal pain, cough and diarrhea   Associated symptoms: no headaches   Diarrhea Associated symptoms: abdominal pain and vomiting   Associated symptoms: no headaches   Cough Associated symptoms: no chest pain, no headaches and no rhinorrhea        Home Medications Prior to Admission medications   Medication Sig Start Date End Date Taking? Authorizing Provider  dicyclomine (BENTYL) 20 MG tablet Take 1 tablet (20 mg total) by mouth 4 (four) times daily as needed (intestinal cramps). 06/28/22   Zenia Resides, MD   levonorgestrel-ethinyl estradiol (SEASONALE) 0.15-0.03 MG tablet Take 1 tablet by mouth daily. 11/25/21   Ameduite, Alvino Chapel, FNP  ondansetron (ZOFRAN-ODT) 4 MG disintegrating tablet Take 1-2 tablets (4-8 mg total) by mouth every 8 (eight) hours as needed for nausea or vomiting. 06/28/22   Zenia Resides, MD      Allergies    Depo-provera [medroxyprogesterone acetate]    Review of Systems   Review of Systems  Constitutional:  Positive for activity change, appetite change and fatigue.  HENT:  Negative for congestion and rhinorrhea.   Respiratory:  Positive for cough.   Cardiovascular:  Negative for chest pain.  Gastrointestinal:  Positive for abdominal pain, diarrhea and vomiting.  Genitourinary:  Negative for dysuria and hematuria.  Neurological:  Positive for weakness. Negative for headaches.   all other systems are negative except as noted in the HPI and PMH.    Physical Exam Updated Vital Signs BP 126/83 (BP Location: Right Arm)   Pulse 96   Temp 98.6 F (37 C) (Oral)   Resp 16   Ht 5\' 2"  (1.575 m)   Wt 89.8 kg   LMP 05/29/2022 (Approximate)   SpO2 100%   BMI 36.21 kg/m  Physical Exam Vitals and nursing note reviewed.  Constitutional:      General: She is not in acute distress.    Appearance: She is well-developed.  HENT:     Head: Normocephalic and atraumatic.     Mouth/Throat:     Pharynx: No oropharyngeal exudate.  Eyes:     Conjunctiva/sclera: Conjunctivae normal.     Pupils: Pupils are equal, round, and reactive to light.  Neck:     Comments: No meningismus. Cardiovascular:     Rate and Rhythm: Normal rate and regular rhythm.     Heart sounds: Normal heart sounds. No murmur heard. Pulmonary:     Effort: Pulmonary effort is normal. No respiratory distress.     Breath sounds: Normal breath sounds.  Abdominal:     Palpations: Abdomen is soft.     Tenderness: There is abdominal tenderness. There is no guarding or rebound.     Comments: Epigastric  tenderness, no guarding or rebound.  No lower abdominal tenderness  Musculoskeletal:        General: No tenderness. Normal range of motion.     Cervical back: Normal range of motion and neck supple.  Skin:    General: Skin is warm.  Neurological:     Mental Status: She is alert and oriented to person, place, and time.     Cranial Nerves: No cranial nerve deficit.     Motor: No abnormal muscle tone.     Coordination: Coordination normal.     Comments:  5/5 strength throughout. CN 2-12 intact.Equal grip strength.   Psychiatric:        Behavior: Behavior normal.     ED Results / Procedures / Treatments   Labs (all labs ordered are listed, but only abnormal results are displayed) Labs Reviewed  COMPREHENSIVE METABOLIC PANEL - Abnormal; Notable for the following components:      Result Value   CO2 21 (*)    Calcium 8.6 (*)    All other components within normal limits  C DIFFICILE QUICK SCREEN W PCR REFLEX    GASTROINTESTINAL PANEL BY PCR, STOOL (REPLACES STOOL CULTURE)  PREGNANCY, URINE  URINALYSIS, ROUTINE W REFLEX MICROSCOPIC  CBC WITH DIFFERENTIAL/PLATELET  LIPASE, BLOOD    EKG None  Radiology US Abdomen Limited RUQ (LIVER/GB)  Result Date: 07/08/2022 CLINICAL DATA:  Right upper quadrant pain EXAM: ULTRASOUND ABDOMEN LIMITED RIGHT UPPER QUADRANT COMPARISON:  CT 04/02/2020 FINDINGS: Gallbladder: Gallbladder is mildly distended. There is non dependent rounded echogenic focus without shadowing measuring 5 mm. Possible polyp. Additional separate tiny foci. No separate shadowing stones. Common bile duct: Diameter: 2 mm Liver: No focal lesion identified. Within normal limits in parenchymal echogenicity. Portal vein is patent on color Doppler imaging with normal direction of blood flow towards the liver. Other: Some limitation with overlapping bowel gas and soft tissue IMPRESSION: Non dependent 5 mm lobular echogenic focus in the gallbladder without shadowing. Additional separate tiny  foci. Possible small polyp. Recommend follow up ultrasound in 6 months. No biliary ductal dilatation. Electronically Signed   By: Karen Kays M.D.   On: 07/08/2022 10:22    Procedures Procedures    Medications Ordered in ED Medications  lactated ringers bolus 1,000 mL (has no administration in time range)  ondansetron (ZOFRAN) injection 4 mg (has no administration in time range)  dicyclomine (BENTYL) injection 20 mg (has no administration in time range)    ED Course/ Medical Decision Making/ A&P                             Medical Decision Making Amount and/or Complexity of Data Reviewed Labs: ordered. Decision-making details documented in ED Course. Radiology: ordered and independent interpretation performed. Decision-making details documented in ED Course. ECG/medicine tests: ordered and independent interpretation  performed. Decision-making details documented in ED Course.  Risk Prescription drug management.   Upper abdominal pain with diarrhea and nausea since yesterday.  Vital stable, abdomen soft without peritoneal signs.  No lower abdominal tenderness to suggest appendicitis.  IV fluids and nausea medication given. hCG is negative.  Urinalysis negative for infection.  No ketones to suggest dehydration. Labs will be obtained given ongoing symptoms.  Labs reassuring.  No leukocytosis.  LFTs and lipase are normal. Stool studies sent and pending.  Ultrasound shows no cholecystitis or gallstones.  Does show possible polyp which needs 91-month follow-up.  Patient able to tolerate p.o. in the ED and feels improved with Bentyl.  Abdomen soft without peritoneal signs.  She is made aware of the gallbladder findings and need for follow-up regarding this.  Suspect likely viral illness causing her diarrhea and nausea.  Stool studies are pending.  Instructed to check results on MyChart later today. Start with clear liquid diet and advance as tolerated. Return to the ED with  worsening symptoms including abdominal pain, vomiting, not able to eat or drink or other concerns.       Final Clinical Impression(s) / ED Diagnoses Final diagnoses:  Diarrhea of presumed infectious origin    Rx / DC Orders ED Discharge Orders     None         Abryana Lykens, Jeannett Senior, MD 07/08/22 1206

## 2022-07-18 ENCOUNTER — Ambulatory Visit (HOSPITAL_COMMUNITY): Payer: Medicaid Other

## 2022-07-19 ENCOUNTER — Ambulatory Visit: Payer: Medicaid Other | Admitting: Family Medicine

## 2022-07-19 VITALS — BP 112/77 | Ht 62.1 in | Wt 199.4 lb

## 2022-07-19 DIAGNOSIS — R1011 Right upper quadrant pain: Secondary | ICD-10-CM | POA: Diagnosis not present

## 2022-07-19 DIAGNOSIS — F439 Reaction to severe stress, unspecified: Secondary | ICD-10-CM | POA: Diagnosis not present

## 2022-07-19 DIAGNOSIS — F411 Generalized anxiety disorder: Secondary | ICD-10-CM

## 2022-07-19 NOTE — Progress Notes (Signed)
   Subjective:    Patient ID: Jody Hansen, female    DOB: 04/10/1995, 27 y.o.   MRN: [id number]  HPI Patient arrives for a follow up on recent ER visit for vomiting and diarrhea. Patient states they found a polyp in her gallbladder. Patient relates since being in the ER upper abdominal discomfort in the right upper quadrant along with nausea vomiting diarrhea.  Every time she eats she gets sick.  Her gallbladder showed a polyp but no acute cholecystitis She would benefit from a HIDA She is also experiencing a lot of stress and anxiety and depression mainly related to her job she states that the work environment is just not something she enjoys she is not thinking about hurting herself or others but she feels she needs some counseling we referred her previously but apparently it was a virtual counseling she would like to do in person See PSQ9 and GAD  Review of Systems     Objective:   Physical Exam General-in no acute distress Eyes-no discharge Lungs-respiratory rate normal, CTA CV-no murmurs,RRR Extremities skin warm dry no edema Neuro grossly normal Behavior normal, alert        Assessment & Plan:   1. RUQ abdominal pain Patient with significant right upper quadrant discomfort after eating ultrasound showed a polyp but also the possibility that it could be ongoing gallbladder issues given her symptoms HIDA test would be wise to delineate this issue and determine if she needs to have her gallbladder removed or refer to gastroenterology - NM Hepato W/Eject Fract  2. Generalized anxiety disorder Patient had been referred for counseling but states that it was initially - Ambulatory referral to Psychology  3. Stress Please see discussion above Patient defers on any medicine Agrees to counseling  In addition to this patient needs FMLA filled out to help accommodate her for stress anxiety depression counseling as well as the abdominal pain nausea vomiting diarrhea as well  as the need for any tests and consultation with specialists  Patient states she does miss a fair amount of work related to her sicknesses but she is trying to do the best she can and going

## 2022-07-20 ENCOUNTER — Telehealth: Payer: Self-pay | Admitting: Family Medicine

## 2022-07-20 ENCOUNTER — Encounter: Payer: Self-pay | Admitting: Family Medicine

## 2022-07-20 NOTE — Telephone Encounter (Signed)
Patient brought in two FMLA to be completed in your blue folder need back by 07/28/22.

## 2022-07-21 ENCOUNTER — Telehealth: Payer: Self-pay | Admitting: Family Medicine

## 2022-07-21 NOTE — Telephone Encounter (Signed)
Apparently referral through where her behavioral health referral was sent has been denied because all Medicaid slots are full  Please have referral coordinators try somewhere else please see message below  Jamie Brookes, Starleen Arms, RN; Gerda Diss, Jonna Coup, MD Hello,  At this time all of our Medicaid slots are full, you might try the [facility] office in [place] in [facility] or [place].

## 2022-07-21 NOTE — Telephone Encounter (Signed)
Referral has been sent to another office per referral coordinator

## 2022-07-24 NOTE — Telephone Encounter (Signed)
Both FMLA forms were filled out please forward these to the patient thank you Please copy/scanned each 1 of these into their appropriate charts thank you

## 2022-07-25 NOTE — Telephone Encounter (Signed)
Faxed and ready for pick up

## 2022-07-26 ENCOUNTER — Encounter (HOSPITAL_COMMUNITY)
Admission: RE | Admit: 2022-07-26 | Discharge: 2022-07-26 | Disposition: A | Discharge status: Home / Self Care | Payer: Medicaid Other | Source: Ambulatory Visit | Attending: Family Medicine | Admitting: Family Medicine

## 2022-07-26 DIAGNOSIS — R1011 Right upper quadrant pain: Secondary | ICD-10-CM | POA: Insufficient documentation

## 2022-07-26 MED ORDER — TECHNETIUM TC 99M MEBROFENIN IV KIT
5.4000 | PACK | Freq: Once | INTRAVENOUS | Status: AC | PRN
  Administered 2022-07-26: 5.4 via INTRAVENOUS

## 2022-07-28 NOTE — Addendum Note (Signed)
Addended by: Margaretha Sheffield on: 07/28/2022 04:55 PM   Modules accepted: Orders

## 2022-08-05 NOTE — Telephone Encounter (Signed)
Message placed in patient's chart °

## 2022-08-13 ENCOUNTER — Encounter: Payer: Self-pay | Admitting: Family Medicine

## 2022-08-14 DIAGNOSIS — E559 Vitamin D deficiency, unspecified: Secondary | ICD-10-CM | POA: Diagnosis not present

## 2022-08-14 DIAGNOSIS — Z0130 Encounter for examination of blood pressure without abnormal findings: Secondary | ICD-10-CM | POA: Diagnosis not present

## 2022-08-14 DIAGNOSIS — F419 Anxiety disorder, unspecified: Secondary | ICD-10-CM | POA: Diagnosis not present

## 2022-08-14 DIAGNOSIS — E7800 Pure hypercholesterolemia, unspecified: Secondary | ICD-10-CM | POA: Diagnosis not present

## 2022-08-14 DIAGNOSIS — Z3200 Encounter for pregnancy test, result unknown: Secondary | ICD-10-CM | POA: Diagnosis not present

## 2022-08-14 DIAGNOSIS — E88819 Insulin resistance, unspecified: Secondary | ICD-10-CM | POA: Diagnosis not present

## 2022-08-14 DIAGNOSIS — F32A Depression, unspecified: Secondary | ICD-10-CM | POA: Diagnosis not present

## 2022-08-14 DIAGNOSIS — R635 Abnormal weight gain: Secondary | ICD-10-CM | POA: Diagnosis not present

## 2022-08-14 DIAGNOSIS — Z6837 Body mass index (BMI) 37.0-37.9, adult: Secondary | ICD-10-CM | POA: Diagnosis not present

## 2022-08-22 NOTE — Telephone Encounter (Signed)
Front-please give work note for the dates included within this MyChart message thank you

## 2022-08-23 ENCOUNTER — Encounter: Payer: Self-pay | Admitting: Family Medicine

## 2022-08-23 DIAGNOSIS — K828 Other specified diseases of gallbladder: Secondary | ICD-10-CM | POA: Diagnosis not present

## 2022-08-23 DIAGNOSIS — K824 Cholesterolosis of gallbladder: Secondary | ICD-10-CM | POA: Diagnosis not present

## 2022-09-01 ENCOUNTER — Ambulatory Visit: Payer: Self-pay | Admitting: General Surgery

## 2022-09-01 NOTE — Pre-Procedure Instructions (Signed)
Surgical Instructions    Your procedure is scheduled on Friday, July 12th.  Report to [facility] Main Entrance "A" at 09:45 A.M., then check in with the Admitting office.  Call this number if you have problems the morning of surgery:  [phone]  If you have any questions prior to your surgery date call [phone]: Open Monday-Friday 8am-4pm If you experience any cold or flu symptoms such as cough, fever, chills, shortness of breath, etc. between now and your scheduled surgery, please notify us at the above number.     Remember:  Do not eat after midnight the night before your surgery  You may drink clear liquids until 08:45 AM the morning of your surgery.   Clear liquids allowed are: Water, Non-Citrus Juices (without pulp), Carbonated Beverages, Clear Tea, Black Coffee Only (NO MILK, CREAM OR POWDERED CREAMER of any kind), and Gatorade.    Take these medicines the morning of surgery with A SIP OF WATER  levonorgestrel-ethinyl estradiol (SEASONALE)   Follow your surgeon's instructions on when to stop Aspirin.  If no instructions were given by your surgeon then you will need to call the office to get those instructions.     As of today, STOP taking any Aleve, Naproxen, Ibuprofen, Motrin, Advil, Goody's, BC's, all herbal medications, fish oil, and all vitamins.  STOP TAKING OZEMPIC 7 DAYS PRIOR TO SURGERY. LAST DOSE ON OR BEFORE 7/4.                    Do NOT Smoke (Tobacco/Vaping) for 24 hours prior to your procedure.  If you use a CPAP at night, you may bring your mask/headgear for your overnight stay.   Contacts, glasses, piercing's, hearing aid's, dentures or partials may not be worn into surgery, please bring cases for these belongings.    For patients admitted to the hospital, discharge time will be determined by your treatment team.   Patients discharged the day of surgery will not be allowed to drive home, and someone needs to stay with them for 24 hours.  SURGICAL  WAITING ROOM VISITATION Patients having surgery or a procedure may have no more than 2 support people in the waiting area - these visitors may rotate.   Children under the age of 38 must have an adult with them who is not the patient. If the patient needs to stay at the hospital during part of their recovery, the visitor guidelines for inpatient rooms apply. Pre-op nurse will coordinate an appropriate time for 1 support person to accompany patient in pre-op.  This support person may not rotate.   Please refer to the [facility] website for the visitor guidelines for Inpatients (after your surgery is over and you are in a regular room).    Special instructions:   [place]- Preparing For Surgery  Before surgery, you can play an important role. Because skin is not sterile, your skin needs to be as free of germs as possible. You can reduce the number of germs on your skin by washing with CHG (chlorahexidine gluconate) Soap before surgery.  CHG is an antiseptic cleaner which kills germs and bonds with the skin to continue killing germs even after washing.    Oral Hygiene is also important to reduce your risk of infection.  Remember - BRUSH YOUR TEETH THE MORNING OF SURGERY WITH YOUR REGULAR TOOTHPASTE  Please do not use if you have an allergy to CHG or antibacterial soaps. If your skin becomes reddened/irritated stop using the CHG.  Do not shave (including legs and underarms) for at least 48 hours prior to first CHG shower. It is OK to shave your face.  Please follow these instructions carefully.   Shower the NIGHT BEFORE SURGERY and the MORNING OF SURGERY  If you chose to wash your hair, wash your hair first as usual with your normal shampoo.  After you shampoo, rinse your hair and body thoroughly to remove the shampoo.  Use CHG Soap as you would any other liquid soap. You can apply CHG directly to the skin and wash gently with a scrungie or a clean washcloth.   Apply the CHG Soap to your  body ONLY FROM THE NECK DOWN.  Do not use on open wounds or open sores. Avoid contact with your eyes, ears, mouth and genitals (private parts). Wash Face and genitals (private parts)  with your normal soap.   Wash thoroughly, paying special attention to the area where your surgery will be performed.  Thoroughly rinse your body with warm water from the neck down.  DO NOT shower/wash with your normal soap after using and rinsing off the CHG Soap.  Pat yourself dry with a CLEAN TOWEL.  Wear CLEAN PAJAMAS to bed the night before surgery  Place CLEAN SHEETS on your bed the night before your surgery  DO NOT SLEEP WITH PETS.   Day of Surgery: Take a shower with CHG soap. Do not wear jewelry or makeup Do not wear lotions, powders, perfumes/colognes, or deodorant. Do not shave 48 hours prior to surgery.  Men may shave face and neck. Do not bring valuables to the hospital.  [facility] is not responsible for any belongings or valuables. Do not wear nail polish, gel polish, artificial nails, or any other type of covering on natural nails (fingers and toes) If you have artificial nails or gel coating that need to be removed by a nail salon, please have this removed prior to surgery. Artificial nails or gel coating may interfere with anesthesia's ability to adequately monitor your vital signs. Wear Clean/Comfortable clothing the morning of surgery Remember to brush your teeth WITH YOUR REGULAR TOOTHPASTE.   Please read over the following fact sheets that you were given.    If you received a COVID test during your pre-op visit  it is requested that you wear a mask when out in public, stay away from anyone that may not be feeling well and notify your surgeon if you develop symptoms. If you have been in contact with anyone that has tested positive in the last 10 days please notify you surgeon.

## 2022-09-01 NOTE — Progress Notes (Signed)
patient called requesting to cancel her PAT appointment because she is going to have to reschedule her surgery for a later date

## 2022-09-02 ENCOUNTER — Encounter (HOSPITAL_COMMUNITY)
Admission: RE | Admit: 2022-09-02 | Discharge: 2022-09-02 | Disposition: A | Discharge status: Home / Self Care | Payer: Medicaid Other | Source: Ambulatory Visit | Attending: General Surgery | Admitting: General Surgery

## 2022-09-13 DIAGNOSIS — Z6836 Body mass index (BMI) 36.0-36.9, adult: Secondary | ICD-10-CM | POA: Diagnosis not present

## 2022-09-13 DIAGNOSIS — F419 Anxiety disorder, unspecified: Secondary | ICD-10-CM | POA: Diagnosis not present

## 2022-09-13 DIAGNOSIS — Z3200 Encounter for pregnancy test, result unknown: Secondary | ICD-10-CM | POA: Diagnosis not present

## 2022-09-13 DIAGNOSIS — E559 Vitamin D deficiency, unspecified: Secondary | ICD-10-CM | POA: Diagnosis not present

## 2022-09-13 DIAGNOSIS — E7800 Pure hypercholesterolemia, unspecified: Secondary | ICD-10-CM | POA: Diagnosis not present

## 2022-09-13 DIAGNOSIS — F32A Depression, unspecified: Secondary | ICD-10-CM | POA: Diagnosis not present

## 2022-09-13 DIAGNOSIS — R635 Abnormal weight gain: Secondary | ICD-10-CM | POA: Diagnosis not present

## 2022-09-13 DIAGNOSIS — Z1339 Encounter for screening examination for other mental health and behavioral disorders: Secondary | ICD-10-CM | POA: Diagnosis not present

## 2022-09-16 ENCOUNTER — Ambulatory Visit (HOSPITAL_COMMUNITY): Admission: RE | Admit: 2022-09-16 | Payer: Medicaid Other | Source: Home / Self Care | Admitting: General Surgery

## 2022-09-16 SURGERY — LAPAROSCOPIC CHOLECYSTECTOMY
Anesthesia: General

## 2022-10-05 ENCOUNTER — Other Ambulatory Visit: Payer: Self-pay | Admitting: Family Medicine

## 2022-10-05 NOTE — Telephone Encounter (Signed)
Please check with patient to see if she still taking this?  If so she can have 90 days but she would need to do a follow-up by 2 fall time with [place]

## 2022-11-17 ENCOUNTER — Encounter: Payer: Self-pay | Admitting: Family Medicine

## 2022-11-17 ENCOUNTER — Other Ambulatory Visit: Payer: Self-pay

## 2022-11-17 DIAGNOSIS — Z30011 Encounter for initial prescription of contraceptive pills: Secondary | ICD-10-CM

## 2022-11-17 MED ORDER — LEVONORGEST-ETH ESTRAD 91-DAY 0.15-0.03 MG PO TABS
1.0000 | ORAL_TABLET | Freq: Every day | ORAL | 0 refills | Status: DC
Start: 2022-11-17 — End: 2023-02-13

## 2022-12-12 ENCOUNTER — Telehealth: Payer: Medicaid Other

## 2022-12-13 ENCOUNTER — Telehealth: Payer: Medicaid Other | Admitting: Physician Assistant

## 2022-12-13 DIAGNOSIS — R3989 Other symptoms and signs involving the genitourinary system: Secondary | ICD-10-CM

## 2022-12-13 MED ORDER — NITROFURANTOIN MONOHYD MACRO 100 MG PO CAPS
100.0000 mg | ORAL_CAPSULE | Freq: Two times a day (BID) | ORAL | 0 refills | Status: DC
Start: 2022-12-13 — End: 2023-09-21

## 2022-12-13 NOTE — Patient Instructions (Signed)
Jody Hansen, thank you for joining [name], PA-C for today's virtual visit.  While this provider is not your primary care provider (PCP), if your PCP is located in our provider database this encounter information will be shared with them immediately following your visit.   A [name] MyChart account gives you access to today's visit and all your visits, tests, and labs performed at [facility] " click here if you don't have a [name] account or go to [email or web address]  Consent: (Patient) Jody Hansen provided verbal consent for this virtual visit at the beginning of the encounter.  Current Medications:  Current Outpatient Medications:    nitrofurantoin, macrocrystal-monohydrate, (MACROBID) 100 MG capsule, Take 1 capsule (100 mg total) by mouth 2 (two) times daily., Disp: 10 capsule, Rfl: 0   aspirin EC 81 MG tablet, Take 81 mg by mouth daily. Swallow whole., Disp: , Rfl:    dicyclomine (BENTYL) 20 MG tablet, Take 1 tablet (20 mg total) by mouth 4 (four) times daily as needed (intestinal cramps). (Patient not taking: Reported on 08/31/2022), Disp: 20 tablet, Rfl: 0   levonorgestrel-ethinyl estradiol (SEASONALE) 0.15-0.03 MG tablet, Take 1 tablet by mouth daily., Disp: 84 tablet, Rfl: 0   ondansetron (ZOFRAN-ODT) 4 MG disintegrating tablet, Take 1-2 tablets (4-8 mg total) by mouth every 8 (eight) hours as needed for nausea or vomiting. (Patient not taking: Reported on 08/31/2022), Disp: 10 tablet, Rfl: 0   OZEMPIC, 1 MG/DOSE, 4 MG/3ML SOPN, Inject 1 mg into the skin every 7 (seven) days., Disp: , Rfl:    Medications ordered in this encounter:  Meds ordered this encounter  Medications   nitrofurantoin, macrocrystal-monohydrate, (MACROBID) 100 MG capsule    Sig: Take 1 capsule (100 mg total) by mouth 2 (two) times daily.    Dispense:  10 capsule    Refill:  0    Order Specific Question:   Supervising Provider    Answer:   Merrilee Jansky [id number]     *If you need refills on other medications prior to your next appointment, please contact your pharmacy*  Follow-Up: Call back or seek an in-person evaluation if the symptoms worsen or if the condition fails to improve as anticipated.  [facility] [phone]  Other Instructions Urinary Tract Infection, Adult  A urinary tract infection (UTI) is an infection of any part of the urinary tract. The urinary tract includes the kidneys, ureters, bladder, and urethra. These organs make, store, and get rid of urine in the body. An upper UTI affects the ureters and kidneys. A lower UTI affects the bladder and urethra. What are the causes? Most urinary tract infections are caused by bacteria in your genital area around your urethra, where urine leaves your body. These bacteria grow and cause inflammation of your urinary tract. What increases the risk? You are more likely to develop this condition if: You have a urinary catheter that stays in place. You are not able to control when you urinate or have a bowel movement (incontinence). You are female and you: Use a spermicide or diaphragm for birth control. Have low estrogen levels. Are pregnant. You have certain genes that increase your risk. You are sexually active. You take antibiotic medicines. You have a condition that causes your flow of urine to slow down, such as: An enlarged prostate, if you are female. Blockage in your urethra. A kidney stone. A nerve condition that affects your bladder control (neurogenic bladder). Not getting enough  to drink, or not urinating often. You have certain medical conditions, such as: Diabetes. A weak disease-fighting system (immunesystem). Sickle cell disease. Gout. Spinal cord injury. What are the signs or symptoms? Symptoms of this condition include: Needing to urinate right away (urgency). Frequent urination. This may include small amounts of urine each time  you urinate. Pain or burning with urination. Blood in the urine. Urine that smells bad or unusual. Trouble urinating. Cloudy urine. Vaginal discharge, if you are female. Pain in the abdomen or the lower back. You may also have: Vomiting or a decreased appetite. Confusion. Irritability or tiredness. A fever or chills. Diarrhea. The first symptom in older adults may be confusion. In some cases, they may not have any symptoms until the infection has worsened. How is this diagnosed? This condition is diagnosed based on your medical history and a physical exam. You may also have other tests, including: Urine tests. Blood tests. Tests for STIs (sexually transmitted infections). If you have had more than one UTI, a cystoscopy or imaging studies may be done to determine the cause of the infections. How is this treated? Treatment for this condition includes: Antibiotic medicine. Over-the-counter medicines to treat discomfort. Drinking enough water to stay hydrated. If you have frequent infections or have other conditions such as a kidney stone, you may need to see a health care provider who specializes in the urinary tract (urologist). In rare cases, urinary tract infections can cause sepsis. Sepsis is a life-threatening condition that occurs when the body responds to an infection. Sepsis is treated in the hospital with IV antibiotics, fluids, and other medicines. Follow these instructions at home:  Medicines Take over-the-counter and prescription medicines only as told by your health care provider. If you were prescribed an antibiotic medicine, take it as told by your health care provider. Do not stop using the antibiotic even if you start to feel better. General instructions Make sure you: Empty your bladder often and completely. Do not hold urine for long periods of time. Empty your bladder after sex. Wipe from front to back after urinating or having a bowel movement if you are female.  Use each tissue only one time when you wipe. Drink enough fluid to keep your urine pale yellow. Keep all follow-up visits. This is important. Contact a health care provider if: Your symptoms do not get better after 1-2 days. Your symptoms go away and then return. Get help right away if: You have severe pain in your back or your lower abdomen. You have a fever or chills. You have nausea or vomiting. Summary A urinary tract infection (UTI) is an infection of any part of the urinary tract, which includes the kidneys, ureters, bladder, and urethra. Most urinary tract infections are caused by bacteria in your genital area. Treatment for this condition often includes antibiotic medicines. If you were prescribed an antibiotic medicine, take it as told by your health care provider. Do not stop using the antibiotic even if you start to feel better. Keep all follow-up visits. This is important. This information is not intended to replace advice given to you by your health care provider. Make sure you discuss any questions you have with your health care provider. Document Revised: 09/29/2019 Document Reviewed: 10/04/2019 Elsevier Patient Education  2024 Elsevier Inc.    If you have been instructed to have an in-person evaluation today at a local Urgent Care facility, please use the link below. It will take you to a list of all of our available  [facility], including address, phone number and hours of operation. Please do not delay care.  [facility]  If you or a family member do not have a primary care provider, use the link below to schedule a visit and establish care. When you choose a [facility] primary care physician or advanced practice provider, you gain a long-term partner in health. Find a Primary Care Provider  Learn more about [facility]'s in-office and virtual care options: [facility] - Get Care Now

## 2022-12-13 NOTE — Progress Notes (Signed)
Virtual Visit Consent   Jody Hansen, you are scheduled for a virtual visit with a [facility] provider today. Just as with appointments in the office, your consent must be obtained to participate. Your consent will be active for this visit and any virtual visit you may have with one of our providers in the next 365 days. If you have a MyChart account, a copy of this consent can be sent to you electronically.  As this is a virtual visit, video technology does not allow for your provider to perform a traditional examination. This may limit your provider's ability to fully assess your condition. If your provider identifies any concerns that need to be evaluated in person or the need to arrange testing (such as labs, EKG, etc.), we will make arrangements to do so. Although advances in technology are sophisticated, we cannot ensure that it will always work on either your end or our end. If the connection with a video visit is poor, the visit may have to be switched to a telephone visit. With either a video or telephone visit, we are not always able to ensure that we have a secure connection.  By engaging in this virtual visit, you consent to the provision of healthcare and authorize for your insurance to be billed (if applicable) for the services provided during this visit. Depending on your insurance coverage, you may receive a charge related to this service.  I need to obtain your verbal consent now. Are you willing to proceed with your visit today? TOWANNA AVERY has provided verbal consent on 12/13/2022 for a virtual visit (video or telephone). [name], PA-C  Date: 12/13/2022 8:15 AM  Virtual Visit via Video Note   I, [name], connected with  Jody Hansen  ([id number], Aug 03, 1995) on 12/13/22 at  8:15 AM EDT by a video-enabled telemedicine application and verified that I am speaking with the correct person using two identifiers.  Location: Patient: Virtual Visit  Location Patient: Mobile Provider: Virtual Visit Location Provider: Home Office   I discussed the limitations of evaluation and management by telemedicine and the availability of in person appointments. The patient expressed understanding and agreed to proceed.    History of Present Illness: Jody Hansen is a 27 y.o. who identifies as a female who was assigned female at birth, and is being seen today for possible UTI.  HPI: Urinary Tract Infection  This is a new problem. The current episode started in the past 7 days (3 days). The problem occurs every urination. The problem has been gradually worsening. The quality of the pain is described as aching. The pain is mild. There has been no fever. Associated symptoms include frequency, hesitancy and urgency. Pertinent negatives include no chills, discharge, flank pain, hematuria, nausea, possible pregnancy or vomiting. Associated symptoms comments: Cloudy, abnormal smell. She has tried increased fluids for the symptoms. The treatment provided no relief. There is no history of recurrent UTIs.     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 Swallow whole., Disp: , Rfl:    dicyclomine (BENTYL) 20 MG tablet, Take 1 tablet (20 mg total) by mouth 4 (four) times daily as needed (intestinal cramps). (Patient not taking: Reported on 08/31/2022), Disp: 20 tablet, Rfl: 0   levonorgestrel-ethinyl estradiol (SEASONALE) 0.15-0.03 MG tablet, Take 1 tablet by mouth daily., Disp: 84 tablet, Rfl: 0   ondansetron (ZOFRAN-ODT) 4 MG disintegrating tablet, Take 1-2 tablets (4-8 mg total) by mouth every 8 (eight) hours as needed for nausea or vomiting. (Patient not taking: Reported on 08/31/2022), Disp: 10 tablet, Rfl: 0   OZEMPIC, 1 MG/DOSE, 4 MG/3ML SOPN, Inject 1 mg into the skin every 7 (seven) days., Disp: , Rfl:   Observations/Objective: Patient is well-developed, well-nourished in no acute distress.  Resting comfortably  Head is normocephalic, atraumatic.  No labored breathing.  Speech is clear and coherent with logical content.  Patient is alert and oriented at baseline.    Assessment and Plan: 1. Suspected UTI - nitrofurantoin, macrocrystal-monohydrate, (MACROBID) 100 MG capsule; Take 1 capsule (100 mg total) by mouth 2 (two) times daily.  Dispense: 10 capsule; Refill: 0  - Worsening symptoms.  - Will treat empirically with Macrobid - May use AZO for bladder spasms - Continue to push fluids.  - Seek in person evaluation for urine culture if symptoms do not improve or if they worsen.    Follow Up Instructions: I discussed the assessment and treatment plan with the patient. The patient was provided an opportunity to ask questions and all were answered. The patient agreed with the plan and demonstrated an understanding of the instructions.  A copy of instructions were sent to the patient via MyChart unless otherwise noted below.    The patient was advised to call back or seek an in-person evaluation if the symptoms worsen or if the condition fails  to improve as anticipated.   [name], PA-C

## 2022-12-18 IMAGING — CT CT ABD-PELV W/ CM
2 of 4 series · 16 of 46 positions shown, 18 images · IV contrast (Omnipaque or Isovue)
Comparison: 06/30/2012

CLINICAL DATA: Right lower quadrant abdominal pain and nausea for 1
week.

EXAM:
CT ABDOMEN AND PELVIS WITH CONTRAST
TECHNIQUE: Multidetector CT imaging of the abdomen and pelvis was performed
using the standard protocol following bolus administration of
intravenous contrast.
CONTRAST:  100mL OMNIPAQUE IOHEXOL 300 MG/ML  SOLN

[Series 2: axial st · axial · 0.92mm/px · z∈[+784,+1220]mm · 13 of 97 slices shown, 15 images]
[im 5/97  soft-tissue]
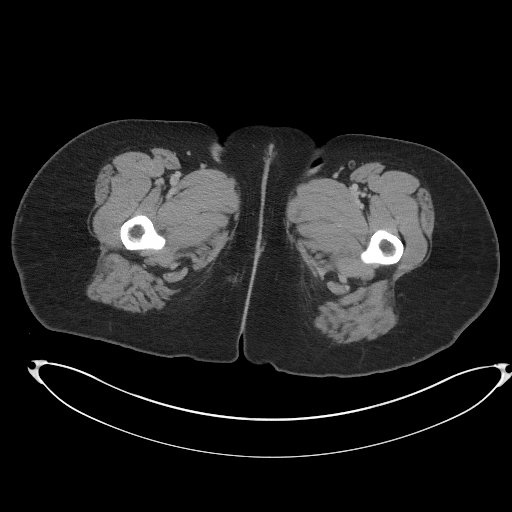
[im 5/97  bone]
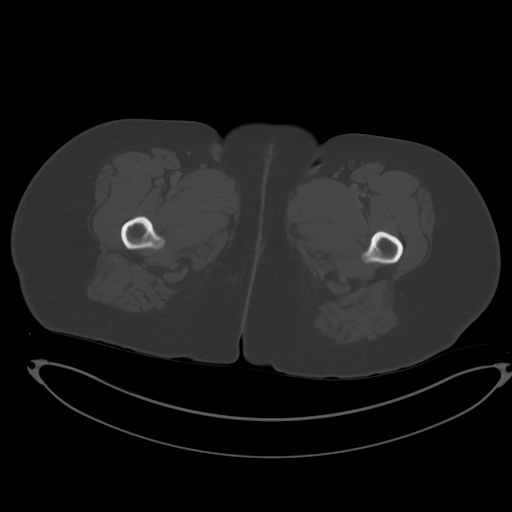
[im 13/97  soft-tissue]
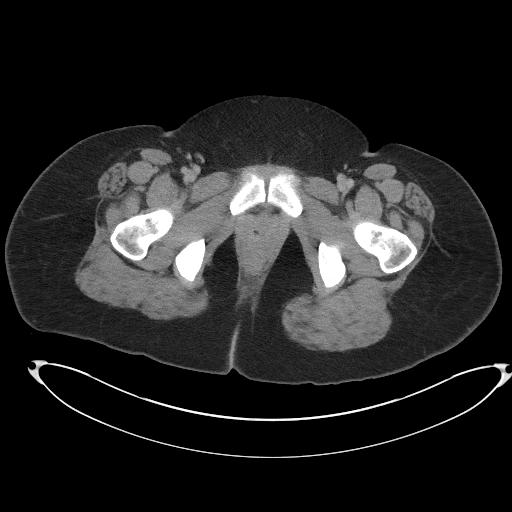
[im 21/97  soft-tissue]
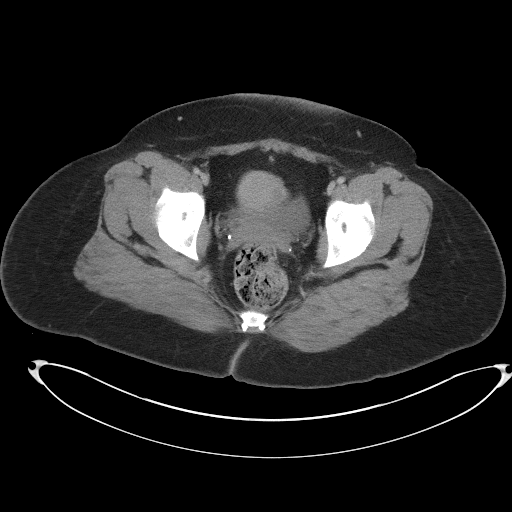
[im 26/97  soft-tissue]
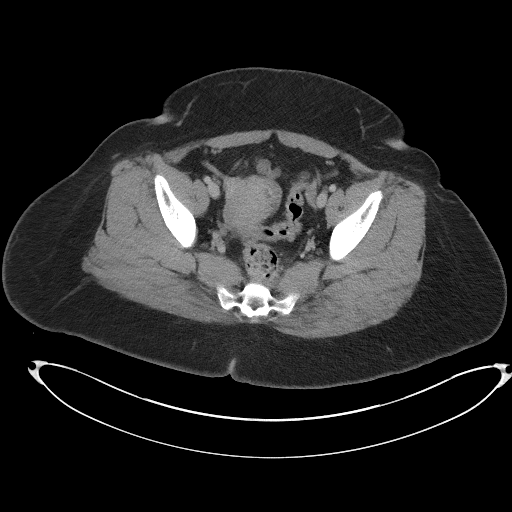
[im 34/97  soft-tissue]
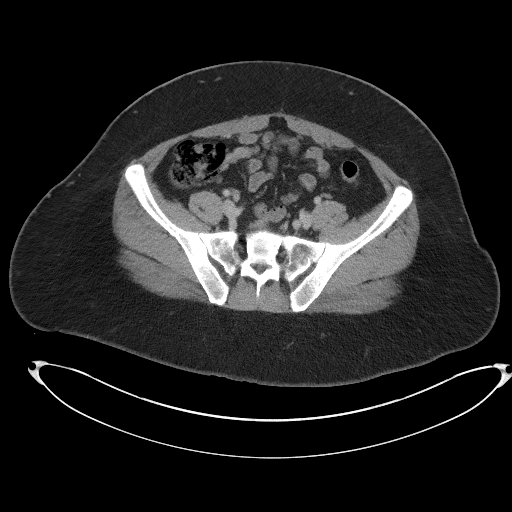
[im 42/97  soft-tissue]
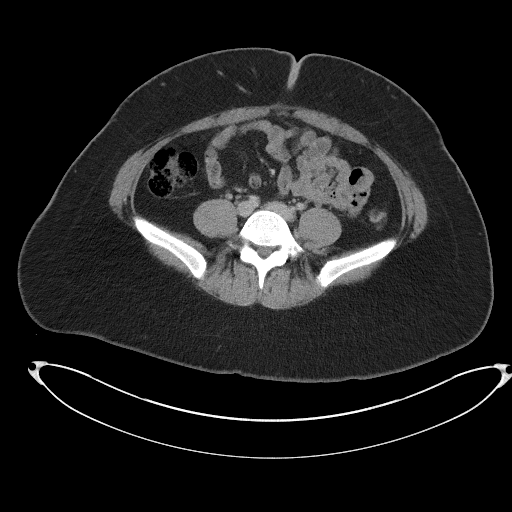
[im 51/97  soft-tissue]
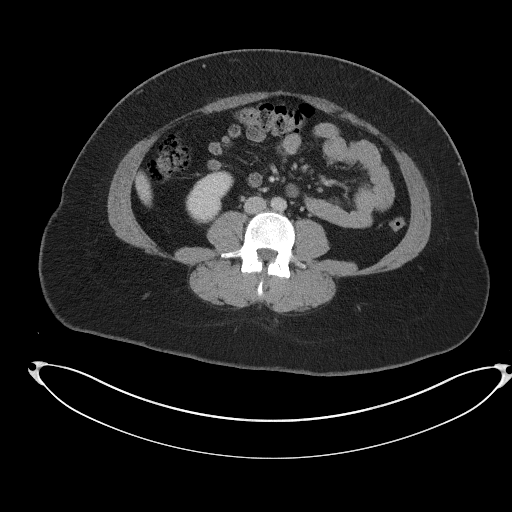
[im 55/97  soft-tissue]
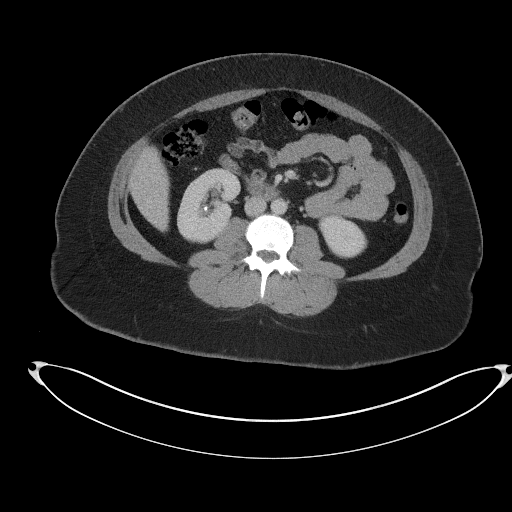
[im 63/97  soft-tissue]
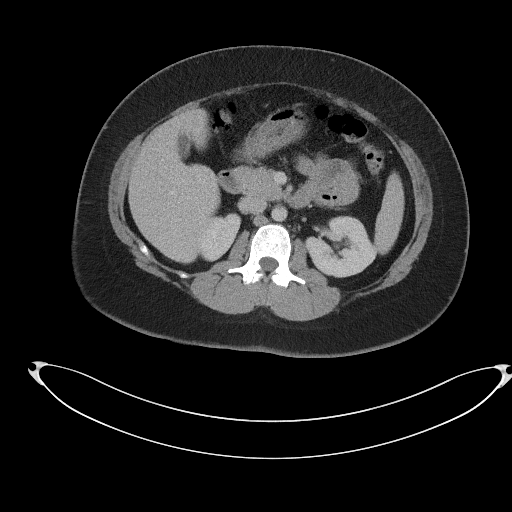
[im 63/97  bone]
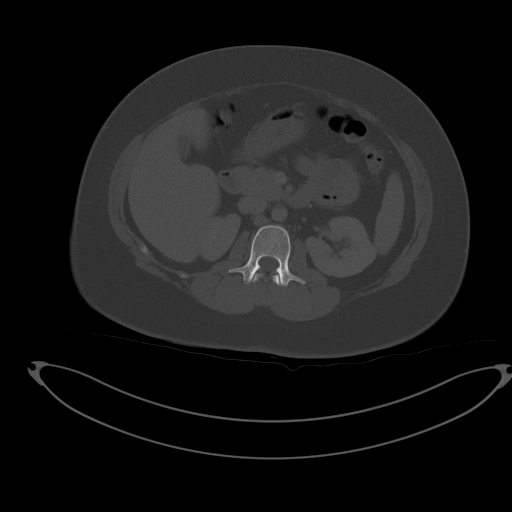
[im 71/97  soft-tissue]
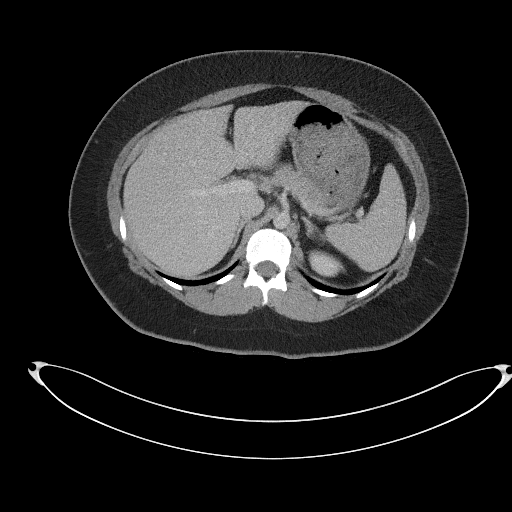
[im 76/97  soft-tissue]
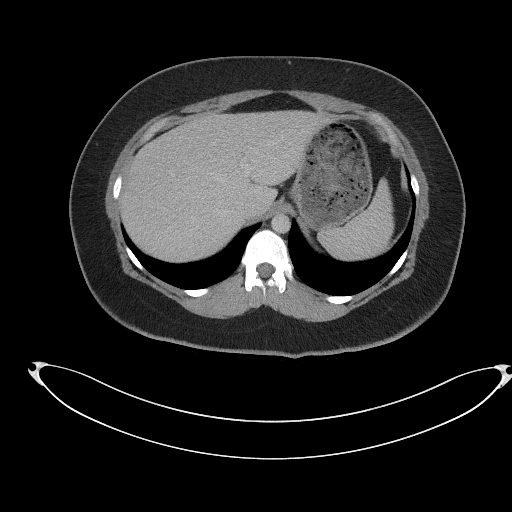
[im 84/97  soft-tissue]
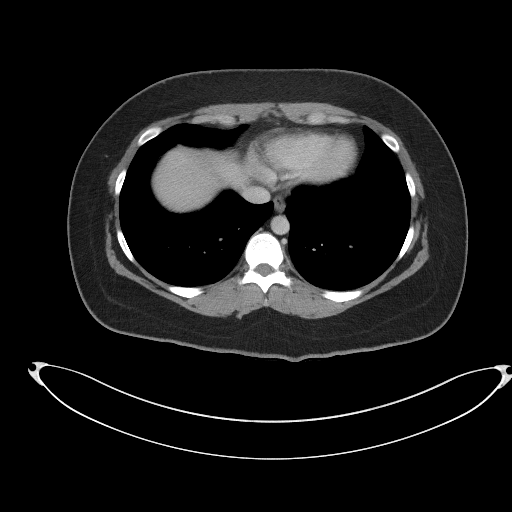
[im 92/97  soft-tissue]
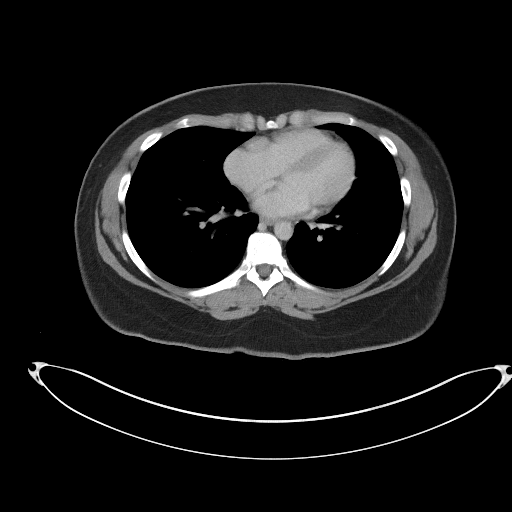

[Series 5: coronal st · coronal · 0.84mm/px · 3 of 112 slices shown]
[im 38/112  soft-tissue]
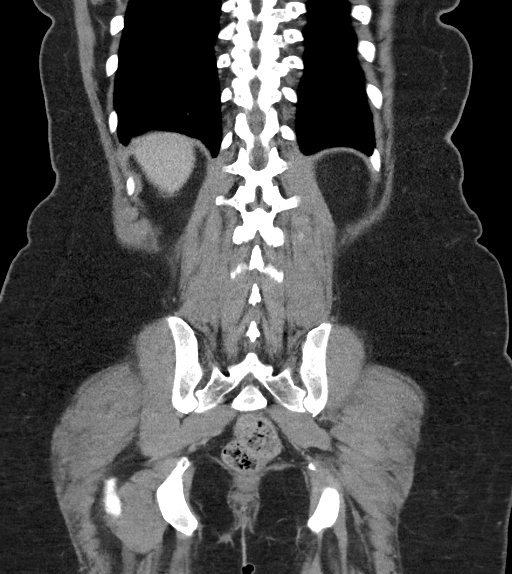
[im 50/112  soft-tissue]
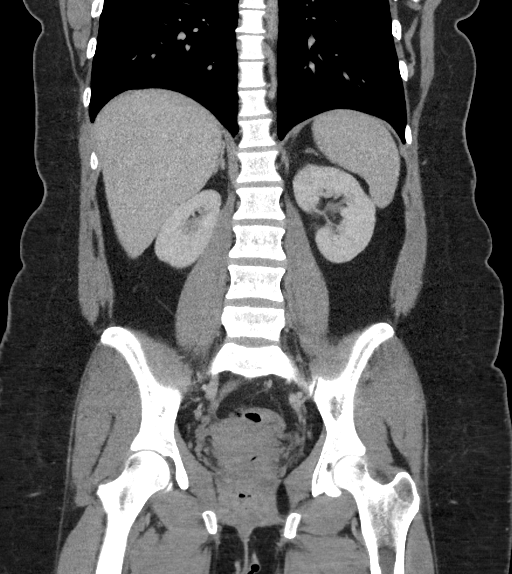
[im 62/112  soft-tissue]
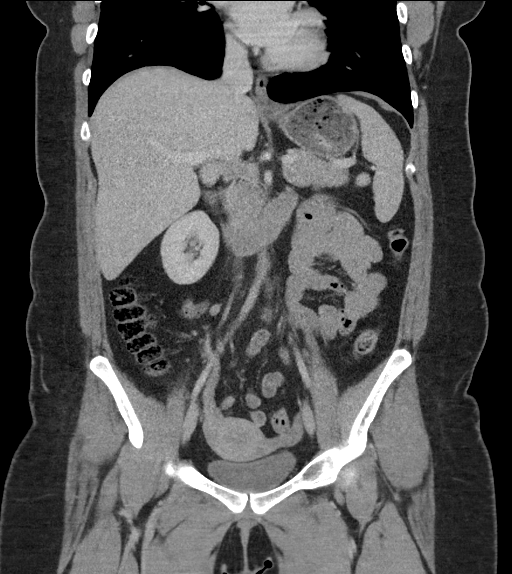

[16 of 46 positions shown; findings below may reference images not displayed]

FINDINGS: Lower chest: Lung bases are clear.

Hepatobiliary: No focal liver abnormality is seen. No gallstones,
gallbladder wall thickening, or biliary dilatation.

Pancreas: Unremarkable. No pancreatic ductal dilatation or
surrounding inflammatory changes.

Spleen: Normal in size without focal abnormality.

Adrenals/Urinary Tract: No adrenal gland nodules. Stone in the lower
pole right kidney measuring 3 mm diameter. No hydronephrosis or
hydroureter. Nephrograms are homogeneous and symmetrical. Bladder is
normal.

Stomach/Bowel: Stomach is within normal limits. Appendix appears
normal. No evidence of bowel wall thickening, distention, or
inflammatory changes.

Vascular/Lymphatic: No significant vascular findings are present. No
enlarged abdominal or pelvic lymph nodes.

Reproductive: Uterus and bilateral adnexa are unremarkable.

Other: No abdominal wall hernia or abnormality. No abdominopelvic
ascites.

Musculoskeletal: No acute or significant osseous findings.
IMPRESSION: 1. No acute process demonstrated in the abdomen or pelvis. No
evidence of bowel obstruction or inflammation. Appendix is normal.
2. Nonobstructing stone in the lower pole right kidney.

## 2022-12-24 ENCOUNTER — Telehealth: Payer: Medicaid Other | Admitting: Family Medicine

## 2022-12-24 DIAGNOSIS — R053 Chronic cough: Secondary | ICD-10-CM | POA: Diagnosis not present

## 2022-12-24 NOTE — Progress Notes (Signed)
Virtual Visit Consent   Jody Hansen, you are scheduled for a virtual visit with a [facility] provider today. Just as with appointments in the office, your consent must be obtained to participate. Your consent will be active for this visit and any virtual visit you may have with one of our providers in the next 365 days. If you have a MyChart account, a copy of this consent can be sent to you electronically.  As this is a virtual visit, video technology does not allow for your provider to perform a traditional examination. This may limit your provider's ability to fully assess your condition. If your provider identifies any concerns that need to be evaluated in person or the need to arrange testing (such as labs, EKG, etc.), we will make arrangements to do so. Although advances in technology are sophisticated, we cannot ensure that it will always work on either your end or our end. If the connection with a video visit is poor, the visit may have to be switched to a telephone visit. With either a video or telephone visit, we are not always able to ensure that we have a secure connection.  By engaging in this virtual visit, you consent to the provision of healthcare and authorize for your insurance to be billed (if applicable) for the services provided during this visit. Depending on your insurance coverage, you may receive a charge related to this service.  I need to obtain your verbal consent now. Are you willing to proceed with your visit today? LORI AMBROSINI has provided verbal consent on 27/19/2024 for a virtual visit (video or telephone). Georgana Curio, FNP  Date: 12/24/2022 3:02 PM  Virtual Visit via Video Note   I, Georgana Curio, connected with  Jody Hansen  ([id number], [birth year]) on 12/24/22 at  3:00 PM EDT by a video-enabled telemedicine application and verified that I am speaking with the correct person using two identifiers.  Location: Patient: Virtual Visit Location  Patient: Home Provider: Virtual Visit Location Provider: Home Office   I discussed the limitations of evaluation and management by telemedicine and the availability of in person appointments. The patient expressed understanding and agreed to proceed.    History of Present Illness: Jody Hansen is a 27 y.o. who identifies as a female who was assigned female at birth, and is being seen today for persistent cough, no wheezing or sob, no fever. She reports cxr was normal. She says they discussed doing a bronchoscopy and she did not follow up. She coughed up a white plug of mucus yesterday and was concerned. She says cough has lasted 1-2 yrs. She requests note for work today for cough. .  HPI: HPI  Problems:  Patient Active Problem List   Diagnosis Date Noted   History of miscarriage 07/07/2021   Pregnancy examination or test, negative result 07/07/2021   Chronic midline low back pain 06/23/2021   Generalized anxiety disorder 08/07/2020   Severe episode of recurrent major depressive disorder, without psychotic features (HCC) 08/07/2020   History of shoulder dystocia in prior pregnancy 09/10/2019   Postpartum depression 08/28/2019   Migraines with aura 06/02/2016   Ganglion cyst 08/10/2015   Renal colic on right side 07/03/2012    Allergies:  Allergies  Allergen Reactions   Depo-Provera [Medroxyprogesterone Acetate] Other (See Comments)    Weight gain; bruising; headaches   Medications:  Current Outpatient Medications:    aspirin EC 81 MG tablet, Take 81 mg by mouth daily. Swallow whole., Disp: ,  Rfl:    dicyclomine (BENTYL) 20 MG tablet, Take 1 tablet (20 mg total) by mouth 4 (four) times daily as needed (intestinal cramps). (Patient not taking: Reported on 08/31/2022), Disp: 20 tablet, Rfl: 0   levonorgestrel-ethinyl estradiol (SEASONALE) 0.15-0.03 MG tablet, Take 1 tablet by mouth daily., Disp: 84 tablet, Rfl: 0   nitrofurantoin, macrocrystal-monohydrate, (MACROBID) 100 MG  capsule, Take 1 capsule (100 mg total) by mouth 2 (two) times daily., Disp: 10 capsule, Rfl: 0   ondansetron (ZOFRAN-ODT) 4 MG disintegrating tablet, Take 1-2 tablets (4-8 mg total) by mouth every 8 (eight) hours as needed for nausea or vomiting. (Patient not taking: Reported on 08/31/2022), Disp: 10 tablet, Rfl: 0   OZEMPIC, 1 MG/DOSE, 4 MG/3ML SOPN, Inject 1 mg into the skin every 7 (seven) days., Disp: , Rfl:   Observations/Objective: Patient is well-developed, well-nourished in no acute distress.  Resting comfortably  at home.  Head is normocephalic, atraumatic.  No labored breathing.  Speech is clear and coherent with logical content.  Patient is alert and oriented at baseline.    Assessment and Plan: 1. Chronic cough  Increase fluids, follow up at [facility] or pcp for referral to pulmonary.   Follow Up Instructions: I discussed the assessment and treatment plan with the patient. The patient was provided an opportunity to ask questions and all were answered. The patient agreed with the plan and demonstrated an understanding of the instructions.  A copy of instructions were sent to the patient via MyChart unless otherwise noted below.     The patient was advised to call back or seek an in-person evaluation if the symptoms worsen or if the condition fails to improve as anticipated.    Georgana Curio, FNP

## 2022-12-24 NOTE — Patient Instructions (Signed)
Cough, Adult Coughing is a reflex that clears your throat and airways (respiratory system). It helps heal and protect your lungs. It is normal to cough from time to time. A cough that happens with other symptoms or that lasts a long time may be a sign of a condition that needs treatment. A short-term (acute) cough may only last 2-3 weeks. A long-term (chronic) cough may last 8 or more weeks. Coughing is often caused by: Diseases, such as: An infection of the respiratory system. Asthma or other heart or lung diseases. Gastroesophageal reflux. This is when acid comes back up from the stomach. Breathing in things that irritate your lungs. Allergies. Postnasal drip. This is when mucus runs down the back of your throat. Smoking. Some medicines. Follow these instructions at home: Medicines Take over-the-counter and prescription medicines only as told by your health care provider. Talk with your provider before you take cough medicine (cough suppressants). Eating and drinking Do not drink alcohol. Avoid caffeine. Drink enough fluid to keep your pee (urine) pale yellow. Lifestyle Avoid cigarette smoke. Do not use any products that contain nicotine or tobacco. These products include cigarettes, chewing tobacco, and vaping devices, such as e-cigarettes. If you need help quitting, ask your provider. Avoid things that make you cough. These may include perfumes, candles, cleaning products, or campfire smoke. General instructions  Watch for any changes to your cough. Tell your provider about them. Always cover your mouth when you cough. If the air is dry in your bedroom or home, use a cool mist vaporizer or humidifier. If your cough is worse at night, try to sleep in a semi-upright position. Rest as needed. Contact a health care provider if: You have new symptoms, or your symptoms get worse. You cough up pus. You have a fever that does not go away or a cough that does not get better after 2-3  weeks. You cannot control your cough with medicine, and you are losing sleep. You have pain that gets worse or is not helped with medicine. You lose weight for no clear reason. You have night sweats. Get help right away if: You cough up blood. You have trouble breathing. Your heart is beating very fast. These symptoms may be an emergency. Get help right away. Call 911. Do not wait to see if the symptoms will go away. Do not drive yourself to the hospital. This information is not intended to replace advice given to you by your health care provider. Make sure you discuss any questions you have with your health care provider. Document Revised: 10/22/2021 Document Reviewed: 10/22/2021 Elsevier Patient Education  2024 Elsevier Inc.  

## 2023-01-03 ENCOUNTER — Telehealth: Payer: Medicaid Other | Admitting: Physician Assistant

## 2023-01-03 DIAGNOSIS — J069 Acute upper respiratory infection, unspecified: Secondary | ICD-10-CM

## 2023-01-03 MED ORDER — PSEUDOEPH-BROMPHEN-DM 30-2-10 MG/5ML PO SYRP
5.0000 mL | ORAL_SOLUTION | Freq: Four times a day (QID) | ORAL | 0 refills | Status: DC | PRN
Start: 2023-01-03 — End: 2023-03-05

## 2023-01-03 MED ORDER — LIDOCAINE VISCOUS HCL 2 % MT SOLN
OROMUCOSAL | 0 refills | Status: DC
Start: 2023-01-03 — End: 2023-09-21

## 2023-01-03 MED ORDER — IPRATROPIUM BROMIDE 0.03 % NA SOLN
2.0000 | Freq: Two times a day (BID) | NASAL | 0 refills | Status: DC
Start: 2023-01-03 — End: 2023-09-21

## 2023-01-03 NOTE — Patient Instructions (Signed)
Jody Hansen, thank you for joining [name], PA-C for today's virtual visit.  While this provider is not your primary care provider (PCP), if your PCP is located in our provider database this encounter information will be shared with them immediately following your visit.   A [facility] MyChart account gives you access to today's visit and all your visits, tests, and labs performed at [facility] " click here if you don't have a [facility] MyChart account or go to [email or web address]  Consent: (Patient) Jody Hansen provided verbal consent for this virtual visit at the beginning of the encounter.  Current Medications:  Current Outpatient Medications:    brompheniramine-pseudoephedrine-DM 30-2-10 MG/5ML syrup, Take 5 mLs by mouth 4 (four) times daily as needed., Disp: 120 mL, Rfl: 0   ipratropium (ATROVENT) 0.03 % nasal spray, Place 2 sprays into both nostrils every 12 (twelve) hours., Disp: 30 mL, Rfl: 0   lidocaine (XYLOCAINE) 2 % solution, Swallow 5mL every 6 hours as needed for sore throat, Disp: 100 mL, Rfl: 0   aspirin EC 81 MG tablet, Take 81 mg by mouth daily. Swallow whole., Disp: , Rfl:    dicyclomine (BENTYL) 20 MG tablet, Take 1 tablet (20 mg total) by mouth 4 (four) times daily as needed (intestinal cramps). (Patient not taking: Reported on 08/31/2022), Disp: 20 tablet, Rfl: 0   levonorgestrel-ethinyl estradiol (SEASONALE) 0.15-0.03 MG tablet, Take 1 tablet by mouth daily., Disp: 84 tablet, Rfl: 0   nitrofurantoin, macrocrystal-monohydrate, (MACROBID) 100 MG capsule, Take 1 capsule (100 mg total) by mouth 2 (two) times daily., Disp: 10 capsule, Rfl: 0   ondansetron (ZOFRAN-ODT) 4 MG disintegrating tablet, Take 1-2 tablets (4-8 mg total) by mouth every 8 (eight) hours as needed for nausea or vomiting. (Patient not taking: Reported on 08/31/2022), Disp: 10 tablet, Rfl: 0   OZEMPIC, 1 MG/DOSE, 4 MG/3ML SOPN, Inject 1 mg into the skin every 7  (seven) days., Disp: , Rfl:    Medications ordered in this encounter:  Meds ordered this encounter  Medications   brompheniramine-pseudoephedrine-DM 30-2-10 MG/5ML syrup    Sig: Take 5 mLs by mouth 4 (four) times daily as needed.    Dispense:  120 mL    Refill:  0    Order Specific Question:   Supervising Provider    Answer:   Merrilee Jansky [[id number]]   lidocaine (XYLOCAINE) 2 % solution    Sig: Swallow 5mL every 6 hours as needed for sore throat    Dispense:  100 mL    Refill:  0    Order Specific Question:   Supervising Provider    Answer:   Merrilee Jansky [[id number]]   ipratropium (ATROVENT) 0.03 % nasal spray    Sig: Place 2 sprays into both nostrils every 12 (twelve) hours.    Dispense:  30 mL    Refill:  0    Order Specific Question:   Supervising Provider    Answer:   Merrilee Jansky [[id number]]     *If you need refills on other medications prior to your next appointment, please contact your pharmacy*  Follow-Up: Call back or seek an in-person evaluation if the symptoms worsen or if the condition fails to improve as anticipated.  [facility] [phone]  Other Instructions Viral Respiratory Infection A respiratory infection is an illness that affects part of the respiratory system, such as the lungs, nose, or throat. A respiratory infection that is caused by a virus is  called a viral respiratory infection. Common types of viral respiratory infections include: A cold. The flu (influenza). A respiratory syncytial virus (RSV) infection. What are the causes? This condition is caused by a virus. The virus may spread through contact with droplets or direct contact with infected people or their mucus or secretions. The virus may spread from person to person (is contagious). What are the signs or symptoms? Symptoms of this condition include: A stuffy or runny nose. A sore throat or cough. Shortness of breath or difficulty breathing. Yellow or green  mucus (sputum). Other symptoms may include: A fever. Sweating or chills. Fatigue. Achy muscles. A headache. How is this diagnosed? This condition may be diagnosed based on: Your symptoms. A physical exam. Testing of secretions from the nose or throat. Chest X-ray. How is this treated? This condition may be treated with medicines, such as: Antiviral medicine. This may shorten the length of time a person has symptoms. Expectorants. These make it easier to cough up mucus. Decongestant nasal sprays. Acetaminophen or NSAIDs, such as ibuprofen, to relieve fever and pain. Antibiotic medicines are not prescribed for viral infections.This is because antibiotics are designed to kill bacteria. They do not kill viruses. Follow these instructions at home: Managing pain and congestion Take over-the-counter and prescription medicines only as told by your health care provider. If you have a sore throat, gargle with a mixture of salt and water 3-4 times a day or as needed. To make salt water, completely dissolve -1 tsp (3-6 g) of salt in 1 cup (237 mL) of warm water. Use nose drops made from salt water to ease congestion and soften raw skin around your nose. Take 2 tsp (10 mL) of honey at bedtime to lessen coughing at night. Do not give honey to children who are younger than 1 year. Drink enough fluid to keep your urine pale yellow. This helps prevent dehydration and helps loosen up mucus. General instructions  Rest as much as possible. Do not drink alcohol. Do not use any products that contain nicotine or tobacco. These products include cigarettes, chewing tobacco, and vaping devices, such as e-cigarettes. If you need help quitting, ask your health care provider. Keep all follow-up visits. This is important. How is this prevented?     Get an annual flu shot. You may get the flu shot in late summer, fall, or winter. Ask your health care provider when you should get your flu shot. Avoid  spreading your infection to other people. If you are sick: Wash your hands with soap and water often, especially after you cough or sneeze. Wash for at least 20 seconds. If soap and water are not available, use alcohol-based hand sanitizer. Cover your mouth when you cough. Cover your nose and mouth when you sneeze. Do not share cups or eating utensils. Clean commonly used objects often. Clean commonly touched surfaces. Stay home from work or school as told by your health care provider. Avoid contact with people who are sick during cold and flu season. This is generally fall and winter. Contact a health care provider if: Your symptoms last for 10 days or longer. Your symptoms get worse over time. You have severe sinus pain in your face or forehead. The glands in your jaw or neck become very swollen. You have shortness of breath. Get help right away if you: Feel pain or pressure in your chest. Have trouble breathing. Faint or feel like you will faint. Have severe and persistent vomiting. Feel confused or disoriented. These  symptoms may represent a serious problem that is an emergency. Do not wait to see if the symptoms will go away. Get medical help right away. Call your local emergency services (911 in the [place] Do not drive yourself to the hospital. Summary A respiratory infection is an illness that affects part of the respiratory system, such as the lungs, nose, or throat. A respiratory infection that is caused by a virus is called a viral respiratory infection. Common types of viral respiratory infections include a cold, influenza, and respiratory syncytial virus (RSV) infection. Symptoms of this condition include a stuffy or runny nose, cough, fatigue, achy muscles, sore throat, and fevers or chills. Antibiotic medicines are not prescribed for viral infections. This is because antibiotics are designed to kill bacteria. They are not effective against viruses. This information is not  intended to replace advice given to you by your health care provider. Make sure you discuss any questions you have with your health care provider. Document Revised: 05/28/2020 Document Reviewed: 05/28/2020 Elsevier Patient Education  2024 Elsevier Inc.    If you have been instructed to have an in-person evaluation today at a local Urgent Care facility, please use the link below. It will take you to a list of all of our available [facility], including address, phone number and hours of operation. Please do not delay care.  [facility]  If you or a family member do not have a primary care provider, use the link below to schedule a visit and establish care. When you choose a [facility] primary care physician or advanced practice provider, you gain a long-term partner in health. Find a Primary Care Provider  Learn more about [facility]'s in-office and virtual care options:  - Get Care Now

## 2023-01-03 NOTE — Progress Notes (Signed)
Virtual Visit Consent   Jody Hansen, you are scheduled for a virtual visit with a [facility] provider today. Just as with appointments in the office, your consent must be obtained to participate. Your consent will be active for this visit and any virtual visit you may have with one of our providers in the next 365 days. If you have a MyChart account, a copy of this consent can be sent to you electronically.  As this is a virtual visit, video technology does not allow for your provider to perform a traditional examination. This may limit your provider's ability to fully assess your condition. If your provider identifies any concerns that need to be evaluated in person or the need to arrange testing (such as labs, EKG, etc.), we will make arrangements to do so. Although advances in technology are sophisticated, we cannot ensure that it will always work on either your end or our end. If the connection with a video visit is poor, the visit may have to be switched to a telephone visit. With either a video or telephone visit, we are not always able to ensure that we have a secure connection.  By engaging in this virtual visit, you consent to the provision of healthcare and authorize for your insurance to be billed (if applicable) for the services provided during this visit. Depending on your insurance coverage, you may receive a charge related to this service.  I need to obtain your verbal consent now. Are you willing to proceed with your visit today? Jody Hansen has provided verbal consent on 01/03/2023 for a virtual visit (video or telephone). [name], PA-C  Date: 01/03/2023 12:12 PM  Virtual Visit via Video Note   IMargaretann Loveless, connected with  Jody Hansen  ([id number], Jun 28, 1995) on 01/03/23 at 12:00 PM EDT by a video-enabled telemedicine application and verified that I am speaking with the correct person using two identifiers.  Location: Patient: Virtual  Visit Location Patient: Home Provider: Virtual Visit Location Provider: Home Office   I discussed the limitations of evaluation and management by telemedicine and the availability of in person appointments. The patient expressed understanding and agreed to proceed.    History of Present Illness: Jody Hansen is a 27 y.o. who identifies as a female who was assigned female at birth, and is being seen today for sore throat symptoms.  HPI: Sore Throat  This is a new problem. The current episode started in the past 7 days (started with diarrhea). The problem has been gradually worsening. There has been no fever. The pain is moderate. Associated symptoms include congestion, coughing, diarrhea (initial symptom), headaches, a plugged ear sensation (right), trouble swallowing and vomiting (coming into back of throat). Pertinent negatives include no ear discharge, ear pain, hoarse voice, neck pain or shortness of breath. Associated symptoms comments: Chills, rhinorrhea, post nasal drainage. She has had no exposure to strep or mono. She has tried nothing for the symptoms. The treatment provided no relief.  Seen virtually on 12/24/22 for cough, work note provided, no treatment given as cough was reported chronic over a couple of years.   Son was sick with similar symptoms.   Problems:  Patient Active Problem List   Diagnosis Date Noted   History of miscarriage 07/07/2021   Pregnancy examination or test, negative result 07/07/2021   Chronic midline low back pain 06/23/2021   Generalized anxiety disorder 08/07/2020   Severe episode of recurrent major depressive disorder, without psychotic features (  HCC) 08/07/2020   History of shoulder dystocia in prior pregnancy 09/10/2019   Postpartum depression 08/28/2019   Migraines with aura 06/02/2016   Ganglion cyst 08/10/2015   Renal colic on right side 07/03/2012    Allergies:  Allergies  Allergen Reactions   Depo-Provera [Medroxyprogesterone Acetate]  Other (See Comments)    Weight gain; bruising; headaches   Medications:  Current Outpatient Medications:    brompheniramine-pseudoephedrine-DM 30-2-10 MG/5ML syrup, Take 5 mLs by mouth 4 (four) times daily as needed., Disp: 120 mL, Rfl: 0   ipratropium (ATROVENT) 0.03 % nasal spray, Place 2 sprays into both nostrils every 12 (twelve) hours., Disp: 30 mL, Rfl: 0   lidocaine (XYLOCAINE) 2 % solution, Swallow 5mL every 6 hours as needed for sore throat, Disp: 100 mL, Rfl: 0   aspirin EC 81 MG tablet, Take 81 mg by mouth daily. Swallow whole., Disp: , Rfl:    dicyclomine (BENTYL) 20 MG tablet, Take 1 tablet (20 mg total) by mouth 4 (four) times daily as needed (intestinal cramps). (Patient not taking: Reported on 08/31/2022), Disp: 20 tablet, Rfl: 0   levonorgestrel-ethinyl estradiol (SEASONALE) 0.15-0.03 MG tablet, Take 1 tablet by mouth daily., Disp: 84 tablet, Rfl: 0   nitrofurantoin, macrocrystal-monohydrate, (MACROBID) 100 MG capsule, Take 1 capsule (100 mg total) by mouth 2 (two) times daily., Disp: 10 capsule, Rfl: 0   ondansetron (ZOFRAN-ODT) 4 MG disintegrating tablet, Take 1-2 tablets (4-8 mg total) by mouth every 8 (eight) hours as needed for nausea or vomiting. (Patient not taking: Reported on 08/31/2022), Disp: 10 tablet, Rfl: 0   OZEMPIC, 1 MG/DOSE, 4 MG/3ML SOPN, Inject 1 mg into the skin every 7 (seven) days., Disp: , Rfl:   Observations/Objective: Patient is well-developed, well-nourished in no acute distress.  Resting comfortably at home.  Head is normocephalic, atraumatic.  No labored breathing. Speech is clear and coherent with logical content.  Patient is alert and oriented at baseline.    Assessment and Plan: 1. Viral URI with cough - brompheniramine-pseudoephedrine-DM 30-2-10 MG/5ML syrup; Take 5 mLs by mouth 4 (four) times daily as needed.  Dispense: 120 mL; Refill: 0 - lidocaine (XYLOCAINE) 2 % solution; Swallow 5mL every 6 hours as needed for sore throat  Dispense: 100  mL; Refill: 0 - ipratropium (ATROVENT) 0.03 % nasal spray; Place 2 sprays into both nostrils every 12 (twelve) hours.  Dispense: 30 mL; Refill: 0  - Suspect viral URI; advised to take at home Covid 19 test - Symptomatic medications of choice over the counter as needed - Bromfed Dm for cough - Ipratropium for drainage and congestion - Viscous lidocaine for sore throat - Push fluids - Rest - Seek further evaluation if symptoms change or worsen   Follow Up Instructions: I discussed the assessment and treatment plan with the patient. The patient was provided an opportunity to ask questions and all were answered. The patient agreed with the plan and demonstrated an understanding of the instructions.  A copy of instructions were sent to the patient via MyChart unless otherwise noted below.    The patient was advised to call back or seek an in-person evaluation if the symptoms worsen or if the condition fails to improve as anticipated.    [name], PA-C

## 2023-01-09 ENCOUNTER — Telehealth: Payer: Medicaid Other | Admitting: Physician Assistant

## 2023-01-09 DIAGNOSIS — R053 Chronic cough: Secondary | ICD-10-CM | POA: Diagnosis not present

## 2023-01-09 DIAGNOSIS — G44209 Tension-type headache, unspecified, not intractable: Secondary | ICD-10-CM | POA: Diagnosis not present

## 2023-01-09 DIAGNOSIS — J324 Chronic pansinusitis: Secondary | ICD-10-CM

## 2023-01-09 MED ORDER — PREDNISONE 20 MG PO TABS
40.0000 mg | ORAL_TABLET | Freq: Every day | ORAL | 0 refills | Status: DC
Start: 2023-01-09 — End: 2023-09-21

## 2023-01-09 MED ORDER — ALBUTEROL SULFATE HFA 108 (90 BASE) MCG/ACT IN AERS: 1.0000 | INHALATION_SPRAY | Freq: Four times a day (QID) | RESPIRATORY_TRACT | 0 refills | Status: DC | PRN

## 2023-01-09 MED ORDER — BACLOFEN 10 MG PO TABS: 10.0000 mg | ORAL_TABLET | Freq: Three times a day (TID) | ORAL | 0 refills | Status: DC

## 2023-01-09 NOTE — Patient Instructions (Signed)
Jody Hansen, thank you for joining [name], PA-C for today's virtual visit.  While this provider is not your primary care provider (PCP), if your PCP is located in our provider database this encounter information will be shared with them immediately following your visit.   A [place] MyChart account gives you access to today's visit and all your visits, tests, and labs performed at [facility] " click here if you don't have a [facility] MyChart account or go to [email or web address]  Consent: (Patient) Jody Hansen provided verbal consent for this virtual visit at the beginning of the encounter.  Current Medications:  Current Outpatient Medications:    albuterol (VENTOLIN HFA) 108 (90 Base) MCG/ACT inhaler, Inhale 1-2 puffs into the lungs every 6 (six) hours as needed., Disp: 8 g, Rfl: 0   baclofen (LIORESAL) 10 MG tablet, Take 1 tablet (10 mg total) by mouth 3 (three) times daily., Disp: 30 each, Rfl: 0   predniSONE (DELTASONE) 20 MG tablet, Take 2 tablets (40 mg total) by mouth daily with breakfast., Disp: 10 tablet, Rfl: 0   aspirin EC 81 MG tablet, Take 81 mg by mouth daily. Swallow whole., Disp: , Rfl:    brompheniramine-pseudoephedrine-DM 30-2-10 MG/5ML syrup, Take 5 mLs by mouth 4 (four) times daily as needed., Disp: 120 mL, Rfl: 0   dicyclomine (BENTYL) 20 MG tablet, Take 1 tablet (20 mg total) by mouth 4 (four) times daily as needed (intestinal cramps). (Patient not taking: Reported on 08/31/2022), Disp: 20 tablet, Rfl: 0   ipratropium (ATROVENT) 0.03 % nasal spray, Place 2 sprays into both nostrils every 12 (twelve) hours., Disp: 30 mL, Rfl: 0   levonorgestrel-ethinyl estradiol (SEASONALE) 0.15-0.03 MG tablet, Take 1 tablet by mouth daily., Disp: 84 tablet, Rfl: 0   lidocaine (XYLOCAINE) 2 % solution, Swallow 5mL every 6 hours as needed for sore throat, Disp: 100 mL, Rfl: 0   nitrofurantoin, macrocrystal-monohydrate, (MACROBID) 100 MG  capsule, Take 1 capsule (100 mg total) by mouth 2 (two) times daily., Disp: 10 capsule, Rfl: 0   ondansetron (ZOFRAN-ODT) 4 MG disintegrating tablet, Take 1-2 tablets (4-8 mg total) by mouth every 8 (eight) hours as needed for nausea or vomiting. (Patient not taking: Reported on 08/31/2022), Disp: 10 tablet, Rfl: 0   OZEMPIC, 1 MG/DOSE, 4 MG/3ML SOPN, Inject 1 mg into the skin every 7 (seven) days., Disp: , Rfl:    Medications ordered in this encounter:  Meds ordered this encounter  Medications   predniSONE (DELTASONE) 20 MG tablet    Sig: Take 2 tablets (40 mg total) by mouth daily with breakfast.    Dispense:  10 tablet    Refill:  0    Order Specific Question:   Supervising Provider    Answer:   Merrilee Jansky [[id number]]   baclofen (LIORESAL) 10 MG tablet    Sig: Take 1 tablet (10 mg total) by mouth 3 (three) times daily.    Dispense:  30 each    Refill:  0    Order Specific Question:   Supervising Provider    Answer:   Merrilee Jansky [[id number]]   albuterol (VENTOLIN HFA) 108 (90 Base) MCG/ACT inhaler    Sig: Inhale 1-2 puffs into the lungs every 6 (six) hours as needed.    Dispense:  8 g    Refill:  0    Order Specific Question:   Supervising Provider    Answer:   Merrilee Jansky [id number]     *  If you need refills on other medications prior to your next appointment, please contact your pharmacy*  Follow-Up: Call back or seek an in-person evaluation if the symptoms worsen or if the condition fails to improve as anticipated.  [facility] [phone]  Other Instructions Tension Headache, Adult A tension headache is a feeling of pain, pressure, or aching over the front and sides of the head. The pain can be dull, or it can feel tight. There are two types of tension headache: Episodic tension headache. This is when the headaches happen fewer than 15 days a month. Chronic tension headache. This is when the headaches happen more than 15 days a month during a  27-month period. A tension headache can last from 30 minutes to several days. It is the most common kind of headache. Tension headaches are not normally associated with nausea or vomiting, and they do not get worse with physical activity. What are the causes? The exact cause of this condition is not known. Tension headaches are often triggered by stress, anxiety, or depression. Other triggers may include: Alcohol. Too much caffeine or caffeine withdrawal. Respiratory infections, such as colds, flu, or sinus infections. Dental problems or teeth clenching. Fatigue. Holding your head and neck in the same position for a long period of time, such as while using a computer. Smoking. Arthritis of the neck. What are the signs or symptoms? Symptoms of this condition include: A feeling of pressure or tightness around the head. Dull, aching head pain. Pain over the front and sides of the head. Tenderness in the muscles of the head, neck, and shoulders. How is this diagnosed? This condition may be diagnosed based on your symptoms, your medical history, and a physical exam. If your symptoms are severe or unusual, you may have imaging tests, such as a CT scan or an MRI of your head. Your vision may also be checked. How is this treated? This condition may be treated with lifestyle changes and with medicines that help relieve symptoms. Follow these instructions at home: Managing pain Take over-the-counter and prescription medicines only as told by your health care provider. When you have a headache, lie down in a dark, quiet room. If directed, put ice on your head and neck. To do this: Put ice in a plastic bag. Place a towel between your skin and the bag. Leave the ice on for 20 minutes, 2-3 times a day. Remove the ice if your skin turns bright red. This is very important. If you cannot feel pain, heat, or cold, you have a greater risk of damage to the area. If directed, apply heat to the back of your  neck as often as told by your health care provider. Use the heat source that your health care provider recommends, such as a moist heat pack or a heating pad. Place a towel between your skin and the heat source. Leave the heat on for 20-30 minutes. Remove the heat if your skin turns bright red. This is especially important if you are unable to feel pain, heat, or cold. You have a greater risk of getting burned. Eating and drinking Eat meals on a regular schedule. If you drink alcohol: Limit how much you have to: 0-1 drink a day for women who are not pregnant. 0-2 drinks a day for men. Know how much alcohol is in your drink. In the [place] one drink equals one 12 oz bottle of beer (355 mL), one 5 oz glass of wine (148 mL),  or one 1 oz glass of hard liquor (44 mL). Drink enough fluid to keep your urine pale yellow. Decrease your caffeine intake, or stop using caffeine. Lifestyle Get 7-9 hours of sleep each night, or get the amount of sleep recommended by your health care provider. At bedtime, remove computers, phones, and tablets from your room. Find ways to manage your stress. This may include: Exercise. Deep breathing exercises. Yoga. Listening to music. Positive mental imagery. Try to sit up straight and avoid tensing your muscles. Do not use any products that contain nicotine or tobacco. These include cigarettes, chewing tobacco, and vaping devices, such as e-cigarettes. If you need help quitting, ask your health care provider. General instructions  Avoid any headache triggers. Keep a journal to help find out what may trigger your headaches. For example, write down: What you eat and drink. How much sleep you get. Any change to your diet or medicines. Keep all follow-up visits. This is important. Contact a health care provider if: Your headache does not get better. Your headache comes back. You are sensitive to sounds, light, or smells because of a headache. You have nausea or you  vomit. Your stomach hurts. Get help right away if: You suddenly develop a severe headache, along with any of the following: A stiff neck. Nausea and vomiting. Confusion. Weakness in one part or one side of your body. Double vision or loss of vision. Shortness of breath. Rash. Unusual sleepiness. Fever or chills. Trouble speaking. Pain in your eye or ear. Trouble walking or balancing. Feeling faint or passing out. Summary A tension headache is a feeling of pain, pressure, or aching over the front and sides of the head. A tension headache can last from 30 minutes to several days. It is the most common kind of headache. This condition may be diagnosed based on your symptoms, your medical history, and a physical exam. This condition may be treated with lifestyle changes and with medicines that help relieve symptoms. This information is not intended to replace advice given to you by your health care provider. Make sure you discuss any questions you have with your health care provider. Document Revised: 11/21/2019 Document Reviewed: 11/21/2019 Elsevier Patient Education  2024 Elsevier Inc.    If you have been instructed to have an in-person evaluation today at a local Urgent Care facility, please use the link below. It will take you to a list of all of our available [facility], including address, phone number and hours of operation. Please do not delay care.  [facility]  If you or a family member do not have a primary care provider, use the link below to schedule a visit and establish care. When you choose a [facility] primary care physician or advanced practice provider, you gain a long-term partner in health. Find a Primary Care Provider  Learn more about [facility]'s in-office and virtual care options:  - Get Care Now

## 2023-01-09 NOTE — Progress Notes (Signed)
Virtual Visit Consent   Jody Hansen, you are scheduled for a virtual visit with a [facility] provider today. Just as with appointments in the office, your consent must be obtained to participate. Your consent will be active for this visit and any virtual visit you may have with one of our providers in the next 365 days. If you have a MyChart account, a copy of this consent can be sent to you electronically.  As this is a virtual visit, video technology does not allow for your provider to perform a traditional examination. This may limit your provider's ability to fully assess your condition. If your provider identifies any concerns that need to be evaluated in person or the need to arrange testing (such as labs, EKG, etc.), we will make arrangements to do so. Although advances in technology are sophisticated, we cannot ensure that it will always work on either your end or our end. If the connection with a video visit is poor, the visit may have to be switched to a telephone visit. With either a video or telephone visit, we are not always able to ensure that we have a secure connection.  By engaging in this virtual visit, you consent to the provision of healthcare and authorize for your insurance to be billed (if applicable) for the services provided during this visit. Depending on your insurance coverage, you may receive a charge related to this service.  I need to obtain your verbal consent now. Are you willing to proceed with your visit today? Jody Hansen has provided verbal consent on 01/09/2023 for a virtual visit (video or telephone). Jody Loveless, PA-C  Date: 01/09/2023 3:13 PM  Virtual Visit via Video Note   I, Jody Hansen, connected with  Jody Hansen  ([id number], 12/03/1995) on 01/09/23 at  3:00 PM EST by a video-enabled telemedicine application and verified that I am speaking with the correct person using two identifiers.  Location: Patient: Virtual Visit  Location Patient: Home Provider: Virtual Visit Location Provider: Home Office   I discussed the limitations of evaluation and management by telemedicine and the availability of in person appointments. The patient expressed understanding and agreed to proceed.    History of Present Illness: Jody Hansen is a 27 y.o. who identifies as a female who was assigned female at birth, and is being seen today for cough and headache.  HPI: URI  This is a new problem. The current episode started more than 1 month ago. The problem has been gradually worsening. There has been no fever. Associated symptoms include congestion, headaches, rhinorrhea, sinus pain and a sore throat (feels like a knot of mucus). Pertinent negatives include no abdominal pain, coughing, diarrhea, ear pain, nausea, plugged ear sensation, swollen glands, vomiting or wheezing. Associated symptoms comments: Lightheaded and feeling off with head movements. She has tried antihistamine, increased fluids and acetaminophen for the symptoms. The treatment provided no relief.    Chronic cough suspected from post nasal drainage and nasal polyps. Does not use Flonase that was prescribed due to side effects  Headache: daily, feels in frontal/midline area and radiates to temples. Squeezing in nature. Causes visual disturbances with it. Does have history of headaches as a child. Did see a specialist as a child for them. Has not been seen as an adult.    Problems:  Patient Active Problem List   Diagnosis Date Noted   History of miscarriage 07/07/2021   Pregnancy examination or test, negative result 07/07/2021  Chronic midline low back pain 06/23/2021   Generalized anxiety disorder 08/07/2020   Severe episode of recurrent major depressive disorder, without psychotic features (HCC) 08/07/2020   History of shoulder dystocia in prior pregnancy 09/10/2019   Postpartum depression 08/28/2019   Migraines with aura 06/02/2016   Ganglion cyst  08/10/2015   Renal colic on right side 07/03/2012    Allergies:  Allergies  Allergen Reactions   Depo-Provera [Medroxyprogesterone Acetate] Other (See Comments)    Weight gain; bruising; headaches   Medications:  Current Outpatient Medications:    albuterol (VENTOLIN HFA) 108 (90 Base) MCG/ACT inhaler, Inhale 1-2 puffs into the lungs every 6 (six) hours as needed., Disp: 8 g, Rfl: 0   baclofen (LIORESAL) 10 MG tablet, Take 1 tablet (10 mg total) by mouth 3 (three) times daily., Disp: 30 each, Rfl: 0   predniSONE (DELTASONE) 20 MG tablet, Take 2 tablets (40 mg total) by mouth daily with breakfast., Disp: 10 tablet, Rfl: 0   aspirin EC 81 MG tablet, Take 81 mg by mouth daily. Swallow whole., Disp: , Rfl:    brompheniramine-pseudoephedrine-DM 30-2-10 MG/5ML syrup, Take 5 mLs by mouth 4 (four) times daily as needed., Disp: 120 mL, Rfl: 0   dicyclomine (BENTYL) 20 MG tablet, Take 1 tablet (20 mg total) by mouth 4 (four) times daily as needed (intestinal cramps). (Patient not taking: Reported on 08/31/2022), Disp: 20 tablet, Rfl: 0   ipratropium (ATROVENT) 0.03 % nasal spray, Place 2 sprays into both nostrils every 12 (twelve) hours., Disp: 30 mL, Rfl: 0   levonorgestrel-ethinyl estradiol (SEASONALE) 0.15-0.03 MG tablet, Take 1 tablet by mouth daily., Disp: 84 tablet, Rfl: 0   lidocaine (XYLOCAINE) 2 % solution, Swallow 5mL every 6 hours as needed for sore throat, Disp: 100 mL, Rfl: 0   nitrofurantoin, macrocrystal-monohydrate, (MACROBID) 100 MG capsule, Take 1 capsule (100 mg total) by mouth 2 (two) times daily., Disp: 10 capsule, Rfl: 0   ondansetron (ZOFRAN-ODT) 4 MG disintegrating tablet, Take 1-2 tablets (4-8 mg total) by mouth every 8 (eight) hours as needed for nausea or vomiting. (Patient not taking: Reported on 08/31/2022), Disp: 10 tablet, Rfl: 0   OZEMPIC, 1 MG/DOSE, 4 MG/3ML SOPN, Inject 1 mg into the skin every 7 (seven) days., Disp: , Rfl:    Observations/Objective: Patient is  well-developed, well-nourished in no acute distress.  Resting comfortably at home.  Head is normocephalic, atraumatic.  No labored breathing.  Speech is clear and coherent with logical content.  Patient is alert and oriented at baseline.    Assessment and Plan: 1. Chronic pansinusitis - predniSONE (DELTASONE) 20 MG tablet; Take 2 tablets (40 mg total) by mouth daily with breakfast.  Dispense: 10 tablet; Refill: 0  2. Tension headache - baclofen (LIORESAL) 10 MG tablet; Take 1 tablet (10 mg total) by mouth 3 (three) times daily.  Dispense: 30 each; Refill: 0  3. Chronic cough - albuterol (VENTOLIN HFA) 108 (90 Base) MCG/ACT inhaler; Inhale 1-2 puffs into the lungs every 6 (six) hours as needed.  Dispense: 8 g; Refill: 0  - Cough suspected due to uncontrolled allergies and post nasal drainage - Has tried flonase but is too drying - Has tried Bromfed DM, Promethazine DM, and tessalon perles; effective to suppress but not curative - Headache daily with squeezing - Suspect headache to be a combination of chronic sinusitis and tension headaches - Will give prednisone to try to break daily headache - Baclofen for suspected muscle tension contributing to headache as well -  Will trial albuterol for chronic cough to see if possible reactive airway contributing as cough is dry  - Discussed following up with PCP for these issues as they are becoming more chronic in nature. May benefit from referral to headache clinic/Neurology for daily headache and pulmonology for chronic cough  Follow Up Instructions: I discussed the assessment and treatment plan with the patient. The patient was provided an opportunity to ask questions and all were answered. The patient agreed with the plan and demonstrated an understanding of the instructions.  A copy of instructions were sent to the patient via MyChart unless otherwise noted below.    The patient was advised to call back or seek an in-person evaluation if  the symptoms worsen or if the condition fails to improve as anticipated.    Jody Loveless, PA-C

## 2023-02-13 ENCOUNTER — Other Ambulatory Visit: Payer: Self-pay | Admitting: Family Medicine

## 2023-02-13 DIAGNOSIS — Z30011 Encounter for initial prescription of contraceptive pills: Secondary | ICD-10-CM

## 2023-02-16 ENCOUNTER — Ambulatory Visit: Payer: Medicaid Other | Admitting: Family Medicine

## 2023-02-27 ENCOUNTER — Ambulatory Visit
Admission: EM | Admit: 2023-02-27 | Discharge: 2023-02-27 | Disposition: A | Discharge status: Home / Self Care | Payer: Medicaid Other | Attending: Nurse Practitioner | Admitting: Nurse Practitioner

## 2023-02-27 DIAGNOSIS — J069 Acute upper respiratory infection, unspecified: Secondary | ICD-10-CM | POA: Diagnosis not present

## 2023-02-27 DIAGNOSIS — L03012 Cellulitis of left finger: Secondary | ICD-10-CM | POA: Diagnosis not present

## 2023-02-27 LAB — POC COVID19/FLU A&B COMBO
Covid Antigen, POC: NEGATIVE
Influenza A Antigen, POC: NEGATIVE
Influenza B Antigen, POC: NEGATIVE

## 2023-02-27 NOTE — Discharge Instructions (Signed)
You have a viral upper respiratory infection.  Symptoms should improve over the next week to 10 days.  If you develop chest pain or shortness of breath, go to the emergency room.  COVID-19 and influenza testing is negative today.  Some things that can make you feel better are: - Increased rest - Increasing fluid with water/sugar free electrolytes - Acetaminophen and ibuprofen as needed for fever/pain - Salt water gargling, chloraseptic spray and throat lozenges - OTC guaifenesin (Mucinex) 600 mg twice daily for congestion - Saline sinus flushes or a neti pot - Humidifying the air  I drained the abscess in your left finger today.  Please keep the area clean and dry and covered while it is draining.  Recommend soaking the area multiple times daily to help drain the rest of the infection.  Seek care if symptoms recur or do not improve with treatment.

## 2023-02-27 NOTE — ED Provider Notes (Signed)
[facility]    CSN: [id number] Arrival date & time: 02/27/23  1634      History   Chief Complaint Chief Complaint  Patient presents with   Cough   Headache   Hand Pain    HPI Jody Hansen is a 27 y.o. female.   Patient presents today with 3-day history of runny and stuffy nose, sore throat that has not improved, frequent sneezing, headache, fatigue, and slight change in appetite.  She denies fever, body aches or chills, cough, shortness of breath or chest pain, ear pain, abdominal pain, nausea/vomiting, and diarrhea.  Reports her kids have been sick with similar symptoms as well.  Has not take anything for symptoms so far.  Also concerned about swelling around left pointer finger that began 2 weeks ago.  Reports initially, she thinks she may have had a paper cut.  Reports the area has gone down in size and then increase in size again.  The area is very painful to touch and is difficult to use at finger due to pain.  Has not tried anything for finger symptoms so far.    Past Medical History:  Diagnosis Date   Kidney stones 03/08/2015   Miscarriage    Pregnant 01/23/2015   Scoliosis     Patient Active Problem List   Diagnosis Date Noted   History of miscarriage 07/07/2021   Pregnancy examination or test, negative result 07/07/2021   Chronic midline low back pain 06/23/2021   Generalized anxiety disorder 08/07/2020   Severe episode of recurrent major depressive disorder, without psychotic features (HCC) 08/07/2020   History of shoulder dystocia in prior pregnancy 09/10/2019   Postpartum depression 08/28/2019   Migraines with aura 06/02/2016   Ganglion cyst 08/10/2015   Renal colic on right side 07/03/2012    Past Surgical History:  Procedure Laterality Date   NO PAST SURGERIES      OB History     Gravida  4   Para  2   Term  2   Preterm      AB  2   Living  2      SAB  2   IAB      Ectopic      Multiple  0   Live Births  2             Home Medications    Prior to Admission medications   Medication Sig Start Date End Date Taking? Authorizing Provider  levonorgestrel-ethinyl estradiol (SEASONALE) 0.15-0.03 MG tablet TAKE 1 TABLET BY MOUTH DAILY 02/13/23  Yes Luking, Scott A, MD  albuterol (VENTOLIN HFA) 108 (90 Base) MCG/ACT inhaler Inhale 1-2 puffs into the lungs every 6 (six) hours as needed. 01/09/23   [name], PA-C  aspirin EC 81 MG tablet Take 81 mg by mouth daily. Swallow whole.    [provider]  baclofen (LIORESAL) 10 MG tablet Take 1 tablet (10 mg total) by mouth 3 (three) times daily. 01/09/23   [name], PA-C  brompheniramine-pseudoephedrine-DM 30-2-10 MG/5ML syrup Take 5 mLs by mouth 4 (four) times daily as needed. 01/03/23   [name], PA-C  dicyclomine (BENTYL) 20 MG tablet Take 1 tablet (20 mg total) by mouth 4 (four) times daily as needed (intestinal cramps). Patient not taking: Reported on 08/31/2022 06/28/22   Zenia Resides, MD  ipratropium (ATROVENT) 0.03 % nasal spray Place 2 sprays into both nostrils every 12 (twelve) hours. 01/03/23   [name],  PA-C  lidocaine (XYLOCAINE) 2 % solution Swallow 5mL every 6 hours as needed for sore throat 01/03/23   [name], PA-C  nitrofurantoin, macrocrystal-monohydrate, (MACROBID) 100 MG capsule Take 1 capsule (100 mg total) by mouth 2 (two) times daily. 12/13/22   [name], PA-C  ondansetron (ZOFRAN-ODT) 4 MG disintegrating tablet Take 1-2 tablets (4-8 mg total) by mouth every 8 (eight) hours as needed for nausea or vomiting. Patient not taking: Reported on 08/31/2022 06/28/22   Zenia Resides, MD  OZEMPIC, 1 MG/DOSE, 4 MG/3ML SOPN Inject 1 mg into the skin every 7 (seven) days. 08/18/22   [provider]  predniSONE (DELTASONE) 20 MG tablet Take 2 tablets (40 mg total) by mouth daily with breakfast. 01/09/23   [name], PA-C    Family History Family  History  Problem Relation Age of Onset   Diabetes Paternal Grandfather    Diabetes Paternal Grandmother    Cancer Maternal Grandmother    Cancer Maternal Grandfather    Diabetes Father    Diabetes Mother    Diabetes Sister        borderline   Lung disease Paternal Aunt     Social History Social History   Tobacco Use   Smoking status: Never    Passive exposure: Past   Smokeless tobacco: Never  Vaping Use   Vaping status: Never Used  Substance Use Topics   Alcohol use: No   Drug use: No     Allergies   Depo-provera [medroxyprogesterone acetate]   Review of Systems Review of Systems Per HPI  Physical Exam Triage Vital Signs ED Triage Vitals  Encounter Vitals Group     BP 02/27/23 1723 116/77     Systolic BP Percentile --      Diastolic BP Percentile --      Pulse Rate 02/27/23 1723 80     Resp 02/27/23 1723 16     Temp 02/27/23 1723 98.8 F (37.1 C)     Temp Source 02/27/23 1723 Oral     SpO2 02/27/23 1723 98 %     Weight --      Height --      Head Circumference --      Peak Flow --      Pain Score 02/27/23 1726 5     Pain Loc --      Pain Education --      Exclude from Growth Chart --    No data found.  Updated Vital Signs BP 116/77 (BP Location: Right Arm)   Pulse 80   Temp 98.8 F (37.1 C) (Oral)   Resp 16   LMP 02/25/2023 (Exact Date)   SpO2 98%   Visual Acuity Right Eye Distance:   Left Eye Distance:   Bilateral Distance:    Right Eye Near:   Left Eye Near:    Bilateral Near:     Physical Exam Vitals and nursing note reviewed.  Constitutional:      General: She is not in acute distress.    Appearance: Normal appearance. She is not ill-appearing or toxic-appearing.  HENT:     Head: Normocephalic and atraumatic.     Right Ear: Tympanic membrane, ear canal and external ear normal.     Left Ear: Tympanic membrane, ear canal and external ear normal.     Nose: Congestion present. No rhinorrhea.     Mouth/Throat:     Mouth: Mucous  membranes are moist.     Pharynx: Oropharynx  is clear. Posterior oropharyngeal erythema present. No oropharyngeal exudate.  Eyes:     General: No scleral icterus.    Extraocular Movements: Extraocular movements intact.  Cardiovascular:     Rate and Rhythm: Normal rate and regular rhythm.  Pulmonary:     Effort: Pulmonary effort is normal. No respiratory distress.     Breath sounds: Normal breath sounds. No wheezing, rhonchi or rales.  Musculoskeletal:     Cervical back: Normal range of motion and neck supple.     Comments: Paronychia to left second digit  Lymphadenopathy:     Cervical: No cervical adenopathy.  Skin:    General: Skin is warm and dry.     Coloration: Skin is not jaundiced or pale.     Findings: No erythema or rash.  Neurological:     Mental Status: She is alert and oriented to person, place, and time.  Psychiatric:        Behavior: Behavior is cooperative.      UC Treatments / Results  Labs (all labs ordered are listed, but only abnormal results are displayed) Labs Reviewed  POC COVID19/FLU A&B COMBO - Normal    EKG   Radiology No results found.  Procedures Incision and Drainage  Date/Time: 02/27/2023 6:36 PM  Performed by: Valentino Nose, NP Authorized by: Valentino Nose, NP   Consent:    Consent obtained:  Verbal   Consent given by:  Patient   Risks, benefits, and alternatives were discussed: yes     Risks discussed:  Bleeding, incomplete drainage and pain   Alternatives discussed:  Alternative treatment Universal protocol:    Procedure explained and questions answered to patient or proxy's satisfaction: yes     Patient identity confirmed:  Verbally with patient Location:    Type:  Abscess   Location:  Upper extremity   Upper extremity location:  Finger   Finger location:  L index finger Pre-procedure details:    Skin preparation:  Chlorhexidine Sedation:    Sedation type:  None Anesthesia:    Anesthesia method:  Topical  application   Topical anesthesia: Freeze Spray. Procedure type:    Complexity:  Simple Procedure details:    Incision types:  Stab incision   Incision depth:  Dermal   Drainage:  Bloody   Drainage amount:  Scant   Packing materials:  None Post-procedure details:    Procedure completion:  Tolerated well, no immediate complications  (including critical care time)  Medications Ordered in UC Medications - No data to display  Initial Impression / Assessment and Plan / UC Course  I have reviewed the triage vital signs and the nursing notes.  Pertinent labs & imaging results that were available during my care of the patient were reviewed by me and considered in my medical decision making (see chart for details).   Patient is well-appearing, normotensive, afebrile, not tachycardic, not tachypneic, oxygenating well on room air.    1. Viral URI with cough Suspect viral etiology Vitals and exam are reassuring COVID-19 testing, influenza testing negative Supportive care discussed with patient ER and return precautions discussed  2. Paronychia of finger of left hand I&D as above Patient tolerated well Recommended warm soaks at home Antibiotics deferred for now, return if symptoms persist/worsen despite treatment Work excuse provided  The patient was given the opportunity to ask questions.  All questions answered to their satisfaction.  The patient is in agreement to this plan.    Final Clinical Impressions(s) / UC Diagnoses  Final diagnoses:  Viral URI with cough  Paronychia of finger of left hand     Discharge Instructions      You have a viral upper respiratory infection.  Symptoms should improve over the next week to 10 days.  If you develop chest pain or shortness of breath, go to the emergency room.  COVID-19 and influenza testing is negative today.  Some things that can make you feel better are: - Increased rest - Increasing fluid with water/sugar free  electrolytes - Acetaminophen and ibuprofen as needed for fever/pain - Salt water gargling, chloraseptic spray and throat lozenges - OTC guaifenesin (Mucinex) 600 mg twice daily for congestion - Saline sinus flushes or a neti pot - Humidifying the air  I drained the abscess in your left finger today.  Please keep the area clean and dry and covered while it is draining.  Recommend soaking the area multiple times daily to help drain the rest of the infection.  Seek care if symptoms recur or do not improve with treatment.     ED Prescriptions   None    PDMP not reviewed this encounter.   Valentino Nose, NP 02/27/23 [phone]

## 2023-02-27 NOTE — ED Triage Notes (Signed)
Sore throat, sneezing, headache, fatigue, congestion, cough x 3 days.  Left pointer finger swelling and redness at nail that started 2 weeks ago.

## 2023-03-05 ENCOUNTER — Ambulatory Visit
Admission: EM | Admit: 2023-03-05 | Discharge: 2023-03-05 | Disposition: A | Discharge status: Home / Self Care | Payer: Medicaid Other | Attending: Internal Medicine | Admitting: Internal Medicine

## 2023-03-05 ENCOUNTER — Ambulatory Visit (INDEPENDENT_AMBULATORY_CARE_PROVIDER_SITE_OTHER): Payer: Medicaid Other

## 2023-03-05 DIAGNOSIS — J329 Chronic sinusitis, unspecified: Secondary | ICD-10-CM | POA: Diagnosis not present

## 2023-03-05 DIAGNOSIS — J40 Bronchitis, not specified as acute or chronic: Secondary | ICD-10-CM | POA: Diagnosis not present

## 2023-03-05 DIAGNOSIS — R110 Nausea: Secondary | ICD-10-CM | POA: Diagnosis not present

## 2023-03-05 DIAGNOSIS — R509 Fever, unspecified: Secondary | ICD-10-CM

## 2023-03-05 DIAGNOSIS — R051 Acute cough: Secondary | ICD-10-CM

## 2023-03-05 DIAGNOSIS — R6883 Chills (without fever): Secondary | ICD-10-CM | POA: Diagnosis not present

## 2023-03-05 MED ORDER — ACETAMINOPHEN 325 MG PO TABS
650.0000 mg | ORAL_TABLET | Freq: Once | ORAL | Status: AC
Start: 2023-03-05 — End: 2023-03-05
  Administered 2023-03-05: 650 mg via ORAL

## 2023-03-05 MED ORDER — ONDANSETRON 4 MG PO TBDP: 4.0000 mg | ORAL_TABLET | Freq: Three times a day (TID) | ORAL | 0 refills | Status: DC | PRN

## 2023-03-05 MED ORDER — DOXYCYCLINE HYCLATE 100 MG PO CAPS: 100.0000 mg | ORAL_CAPSULE | Freq: Two times a day (BID) | ORAL | 0 refills | Status: DC

## 2023-03-05 MED ORDER — PROMETHAZINE-DM 6.25-15 MG/5ML PO SYRP: 5.0000 mL | ORAL_SOLUTION | Freq: Four times a day (QID) | ORAL | 0 refills | Status: DC | PRN

## 2023-03-05 MED ORDER — ONDANSETRON 4 MG PO TBDP
4.0000 mg | ORAL_TABLET | Freq: Once | ORAL | Status: AC
  Administered 2023-03-05: 4 mg via ORAL

## 2023-03-05 MED ORDER — IBUPROFEN 800 MG PO TABS
800.0000 mg | ORAL_TABLET | Freq: Once | ORAL | Status: AC
  Administered 2023-03-05: 800 mg via ORAL

## 2023-03-05 NOTE — Discharge Instructions (Signed)
We are going to start an antibiotic to cover for sinus infection as well as atypical pneumonia.  Start doxycycline 100 mg twice daily for 7 days.  Use Tylenol and ibuprofen to help manage her fever and discomfort.  Use Promethazine DM for cough.  This will make you sleepy so do not drive or drink alcohol while taking it.  Take Zofran every 8 hours as needed for nausea and vomiting symptoms.  Make sure that you rest and are drinking plenty of fluid.  You can use over-the-counter medication including Mucinex, Tylenol, Flonase, nasal saline/sinus rinses.  If your symptoms are not improving within a few days or if anything worsens and you have worsening cough, shortness of breath, chest pain, persistent fever not responding to medication, weakness you need to go to the ER.

## 2023-03-05 NOTE — ED Provider Notes (Signed)
[facility]    CSN: [id number] Arrival date & time: 03/05/23  0810      History   Chief Complaint Chief Complaint  Patient presents with   Cough    HPI Jody Hansen is a 27 y.o. female.   Patient presents today with a 9-day history of URI symptoms.  She was seen on 02/27/2023 at which point she tested negative for COVID and flu.  She has been using over-the-counter medication including Robitussin and aspirin without improvement of symptoms.  She denies any recent antibiotics or steroids.  Denies history of asthma, allergies, COPD.  She does not smoke.  She has been experiencing significant nausea that has decreased her oral intake.  Denies any vomiting, abdominal pain, diarrhea.  She has no concern for pregnancy.    Past Medical History:  Diagnosis Date   Kidney stones 03/08/2015   Miscarriage    Pregnant 01/23/2015   Scoliosis     Patient Active Problem List   Diagnosis Date Noted   History of miscarriage 07/07/2021   Pregnancy examination or test, negative result 07/07/2021   Chronic midline low back pain 06/23/2021   Generalized anxiety disorder 08/07/2020   Severe episode of recurrent major depressive disorder, without psychotic features (HCC) 08/07/2020   History of shoulder dystocia in prior pregnancy 09/10/2019   Postpartum depression 08/28/2019   Migraines with aura 06/02/2016   Ganglion cyst 08/10/2015   Renal colic on right side 07/03/2012    Past Surgical History:  Procedure Laterality Date   NO PAST SURGERIES      OB History     Gravida  4   Para  2   Term  2   Preterm      AB  2   Living  2      SAB  2   IAB      Ectopic      Multiple  0   Live Births  2            Home Medications    Prior to Admission medications   Medication Sig Start Date End Date Taking? Authorizing Provider  doxycycline (VIBRAMYCIN) 100 MG capsule Take 1 capsule (100 mg total) by mouth 2 (two) times daily. 03/05/23  Yes  Laikynn Pollio K, PA-C  ondansetron (ZOFRAN-ODT) 4 MG disintegrating tablet Take 1 tablet (4 mg total) by mouth every 8 (eight) hours as needed for nausea or vomiting. 03/05/23  Yes Quentin Shorey K, PA-C  promethazine-dextromethorphan (PROMETHAZINE-DM) 6.25-15 MG/5ML syrup Take 5 mLs by mouth 4 (four) times daily as needed for cough. 03/05/23  Yes Auren Valdes K, PA-C  albuterol (VENTOLIN HFA) 108 (90 Base) MCG/ACT inhaler Inhale 1-2 puffs into the lungs every 6 (six) hours as needed. 01/09/23   [name], PA-C  aspirin EC 81 MG tablet Take 81 mg by mouth daily. Swallow whole.    [provider]  baclofen (LIORESAL) 10 MG tablet Take 1 tablet (10 mg total) by mouth 3 (three) times daily. 01/09/23   [name], PA-C  dicyclomine (BENTYL) 20 MG tablet Take 1 tablet (20 mg total) by mouth 4 (four) times daily as needed (intestinal cramps). Patient not taking: Reported on 08/31/2022 06/28/22   Zenia Resides, MD  ipratropium (ATROVENT) 0.03 % nasal spray Place 2 sprays into both nostrils every 12 (twelve) hours. 01/03/23   [name], PA-C  levonorgestrel-ethinyl estradiol (SEASONALE) 0.15-0.03 MG tablet TAKE 1 TABLET BY MOUTH DAILY 02/13/23  Babs Sciara, MD  lidocaine (XYLOCAINE) 2 % solution Swallow 5mL every 6 hours as needed for sore throat 01/03/23   [name], PA-C  nitrofurantoin, macrocrystal-monohydrate, (MACROBID) 100 MG capsule Take 1 capsule (100 mg total) by mouth 2 (two) times daily. 12/13/22   Burnette, Alessandra Bevels, PA-C  OZEMPIC, 1 MG/DOSE, 4 MG/3ML SOPN Inject 1 mg into the skin every 7 (seven) days. 08/18/22   [provider]  predniSONE (DELTASONE) 20 MG tablet Take 2 tablets (40 mg total) by mouth daily with breakfast. 01/09/23   [name], PA-C    Family History Family History  Problem Relation Age of Onset   Diabetes Paternal Grandfather    Diabetes Paternal Grandmother    Cancer Maternal Grandmother    Cancer  Maternal Grandfather    Diabetes Father    Diabetes Mother    Diabetes Sister        borderline   Lung disease Paternal Aunt     Social History Social History   Tobacco Use   Smoking status: Never    Passive exposure: Past   Smokeless tobacco: Never  Vaping Use   Vaping status: Never Used  Substance Use Topics   Alcohol use: No   Drug use: No     Allergies   Depo-provera [medroxyprogesterone acetate]   Review of Systems Review of Systems  Constitutional:  Positive for activity change, fatigue and fever. Negative for appetite change.  HENT:  Positive for congestion, postnasal drip and sore throat. Negative for sinus pressure and sneezing.   Respiratory:  Positive for cough. Negative for shortness of breath.   Cardiovascular:  Negative for chest pain.  Gastrointestinal:  Positive for nausea. Negative for abdominal pain, diarrhea and vomiting.  Neurological:  Positive for headaches. Negative for dizziness and light-headedness.     Physical Exam Triage Vital Signs ED Triage Vitals  Encounter Vitals Group     BP 03/05/23 0853 114/70     Systolic BP Percentile --      Diastolic BP Percentile --      Pulse Rate 03/05/23 0853 (!) 112     Resp 03/05/23 0853 20     Temp 03/05/23 0853 (!) 100.4 F (38 C)     Temp Source 03/05/23 0853 Oral     SpO2 03/05/23 0853 95 %     Weight --      Height --      Head Circumference --      Peak Flow --      Pain Score 03/05/23 0854 5     Pain Loc --      Pain Education --      Exclude from Growth Chart --    No data found.  Updated Vital Signs BP 118/83 (BP Location: Left Arm)   Pulse 100   Temp 100.2 F (37.9 C) (Oral)   Resp 20   LMP 02/25/2023 (Exact Date)   SpO2 96%   Visual Acuity Right Eye Distance:   Left Eye Distance:   Bilateral Distance:    Right Eye Near:   Left Eye Near:    Bilateral Near:     Physical Exam Vitals reviewed.  Constitutional:      General: She is awake. She is not in acute  distress.    Appearance: Normal appearance. She is well-developed. She is not ill-appearing.     Comments: Very pleasant female appears stated age in no acute distress sitting comfortably in exam room  HENT:  Head: Normocephalic and atraumatic.     Right Ear: Tympanic membrane, ear canal and external ear normal. Tympanic membrane is not erythematous or bulging.     Left Ear: Tympanic membrane, ear canal and external ear normal. Tympanic membrane is not erythematous or bulging.     Nose:     Right Sinus: No maxillary sinus tenderness or frontal sinus tenderness.     Left Sinus: No maxillary sinus tenderness or frontal sinus tenderness.     Mouth/Throat:     Pharynx: Uvula midline. Postnasal drip present. No oropharyngeal exudate or posterior oropharyngeal erythema.  Cardiovascular:     Rate and Rhythm: Normal rate and regular rhythm.     Heart sounds: Normal heart sounds, S1 normal and S2 normal. No murmur heard. Pulmonary:     Effort: Pulmonary effort is normal.     Breath sounds: Examination of the right-lower field reveals decreased breath sounds. Examination of the left-lower field reveals decreased breath sounds. Decreased breath sounds present. No wheezing, rhonchi or rales.  Psychiatric:        Behavior: Behavior is cooperative.      UC Treatments / Results  Labs (all labs ordered are listed, but only abnormal results are displayed) Labs Reviewed - No data to display  EKG   Radiology DG Chest 2 View Result Date: 03/05/2023 CLINICAL DATA:  27 year old female with cough congestion and chills for 3 days. EXAM: CHEST - 2 VIEW COMPARISON:  Chest radiographs 12/07/2021. FINDINGS: Lung volumes and mediastinal contours remain normal. Visualized tracheal air column is within normal limits. Both lungs appear stable, clear. No pneumothorax or pleural effusion. Negative visible bowel gas and osseous structures. IMPRESSION: Negative.  No cardiopulmonary abnormality. Electronically  Signed   By: Odessa Fleming M.D.   On: 03/05/2023 09:35    Procedures Procedures (including critical care time)  Medications Ordered in UC Medications  acetaminophen (TYLENOL) tablet 650 mg (650 mg Oral Given 03/05/23 0858)  ondansetron (ZOFRAN-ODT) disintegrating tablet 4 mg (4 mg Oral Given 03/05/23 0915)  ibuprofen (ADVIL) tablet 800 mg (800 mg Oral Given 03/05/23 1001)    Initial Impression / Assessment and Plan / UC Course  I have reviewed the triage vital signs and the nursing notes.  Pertinent labs & imaging results that were available during my care of the patient were reviewed by me and considered in my medical decision making (see chart for details).     Patient was initially febrile and tachycardic but this improved after antipyretics in clinic.  She is otherwise well-appearing and nontoxic.  Viral testing was deferred as patient has been sick for over a week and already had negative flu/COVID testing.  Chest x-ray was obtained that showed no acute cardiopulmonary disease.  Given her persistent and worsening symptoms will cover for sinobronchitis.  She was started on doxycycline 100 mg twice daily to cover for both acute sinusitis as well as atypical pneumonia given the increased incidence of mycoplasma pneumonia in the community currently.  Discussed that she is to avoid prolonged sun exposure while on this medication as it causes photosensitivity.  She was given Zofran in clinic and able to pass oral challenge.  Prescription for this was sent to pharmacy and we discussed that she should take it on a scheduled basis for the next 24 hours in order to ensure appropriate oral intake and pushing fluids.  After that she can use it as needed.  Promethazine DM was sent for cough.  We discussed that this can be sedating  and she is not to drive or drink alcohol with taking it.  Recommended that she rest and drink plenty of fluid.  If her symptoms are not improving within a few days she is to return  for reevaluation.  If she has any worsening symptoms she needs to be seen emergently including chest pain, shortness of breath, fever not responding to antipyretics, nausea/vomiting resistant to antiemetics, weakness.  Strict return precautions given.  Excuse note provided.  Final Clinical Impressions(s) / UC Diagnoses   Final diagnoses:  Acute cough  Sinobronchitis  Fever, unspecified  Nausea without vomiting     Discharge Instructions      We are going to start an antibiotic to cover for sinus infection as well as atypical pneumonia.  Start doxycycline 100 mg twice daily for 7 days.  Use Tylenol and ibuprofen to help manage her fever and discomfort.  Use Promethazine DM for cough.  This will make you sleepy so do not drive or drink alcohol while taking it.  Take Zofran every 8 hours as needed for nausea and vomiting symptoms.  Make sure that you rest and are drinking plenty of fluid.  You can use over-the-counter medication including Mucinex, Tylenol, Flonase, nasal saline/sinus rinses.  If your symptoms are not improving within a few days or if anything worsens and you have worsening cough, shortness of breath, chest pain, persistent fever not responding to medication, weakness you need to go to the ER.     ED Prescriptions     Medication Sig Dispense Auth. Provider   promethazine-dextromethorphan (PROMETHAZINE-DM) 6.25-15 MG/5ML syrup Take 5 mLs by mouth 4 (four) times daily as needed for cough. 118 mL Daria Mcmeekin K, PA-C   doxycycline (VIBRAMYCIN) 100 MG capsule Take 1 capsule (100 mg total) by mouth 2 (two) times daily. 20 capsule Okema Rollinson K, PA-C   ondansetron (ZOFRAN-ODT) 4 MG disintegrating tablet Take 1 tablet (4 mg total) by mouth every 8 (eight) hours as needed for nausea or vomiting. 20 tablet Sheila Ocasio, Noberto Retort, PA-C      PDMP not reviewed this encounter.   Jeani Hawking, PA-C 03/05/23 1034

## 2023-03-05 NOTE — ED Triage Notes (Signed)
Patient with c/o cough, congestion and chills for 3 days. Patient states she has tried robitussin and aspirin with no relief.

## 2023-03-29 ENCOUNTER — Other Ambulatory Visit: Payer: Self-pay | Admitting: Medical Genetics

## 2023-04-07 ENCOUNTER — Emergency Department (HOSPITAL_COMMUNITY)
Admission: EM | Admit: 2023-04-07 | Discharge: 2023-04-07 | Disposition: A | Discharge status: Home / Self Care | Payer: Medicaid Other | Attending: Emergency Medicine | Admitting: Emergency Medicine

## 2023-04-07 ENCOUNTER — Encounter (HOSPITAL_COMMUNITY): Payer: Self-pay

## 2023-04-07 ENCOUNTER — Other Ambulatory Visit: Payer: Self-pay

## 2023-04-07 DIAGNOSIS — R112 Nausea with vomiting, unspecified: Secondary | ICD-10-CM | POA: Insufficient documentation

## 2023-04-07 DIAGNOSIS — Z7982 Long term (current) use of aspirin: Secondary | ICD-10-CM | POA: Insufficient documentation

## 2023-04-07 DIAGNOSIS — Z794 Long term (current) use of insulin: Secondary | ICD-10-CM | POA: Insufficient documentation

## 2023-04-07 DIAGNOSIS — R197 Diarrhea, unspecified: Secondary | ICD-10-CM | POA: Insufficient documentation

## 2023-04-07 DIAGNOSIS — Z20822 Contact with and (suspected) exposure to covid-19: Secondary | ICD-10-CM | POA: Diagnosis not present

## 2023-04-07 LAB — RESP PANEL BY RT-PCR (RSV, FLU A&B, COVID)  RVPGX2
Influenza A by PCR: NEGATIVE
Influenza B by PCR: NEGATIVE
Resp Syncytial Virus by PCR: NEGATIVE
SARS Coronavirus 2 by RT PCR: NEGATIVE

## 2023-04-07 MED ORDER — ONDANSETRON 4 MG PO TBDP
4.0000 mg | ORAL_TABLET | Freq: Once | ORAL | Status: AC
Start: 2023-04-07 — End: 2023-04-07
  Administered 2023-04-07: 4 mg via ORAL
  Filled 2023-04-07: qty 1

## 2023-04-07 MED ORDER — ONDANSETRON 4 MG PO TBDP: 4.0000 mg | ORAL_TABLET | Freq: Three times a day (TID) | ORAL | 0 refills | Status: DC | PRN

## 2023-04-07 NOTE — ED Triage Notes (Signed)
Patient reports nausea, vomiting,and diarrhea x 1 day. Denies fevers. Entire family is ill.

## 2023-04-07 NOTE — ED Provider Notes (Signed)
[place] EMERGENCY DEPARTMENT AT [facility] Provider Note   CSN: [id number] Arrival date & time: 04/07/23  1324     History  Chief Complaint  Patient presents with   Emesis    Jody Hansen is a 28 y.o. female.  Patient complains of nausea vomiting and diarrhea.  Patient reports that her 56-year-old was sick 2 days ago.  Patient states he is better but now her 5-year-old and she is are sick.  Patient reports vomiting just before she arrived.  Patient denies any fever or chills she denies any cough.  Patient denies any flu or COVID exposure  The history is provided by the patient. No language interpreter was used.  Emesis      Home Medications Prior to Admission medications   Medication Sig Start Date End Date Taking? Authorizing Provider  ondansetron (ZOFRAN-ODT) 4 MG disintegrating tablet Take 1 tablet (4 mg total) by mouth every 8 (eight) hours as needed for nausea or vomiting. 04/07/23  Yes Cheron Schaumann K, PA-C  albuterol (VENTOLIN HFA) 108 (90 Base) MCG/ACT inhaler Inhale 1-2 puffs into the lungs every 6 (six) hours as needed. 01/09/23   [name], PA-C  aspirin EC 81 MG tablet Take 81 mg by mouth daily. Swallow whole.    [provider]  baclofen (LIORESAL) 10 MG tablet Take 1 tablet (10 mg total) by mouth 3 (three) times daily. 01/09/23   [name], PA-C  dicyclomine (BENTYL) 20 MG tablet Take 1 tablet (20 mg total) by mouth 4 (four) times daily as needed (intestinal cramps). Patient not taking: Reported on 08/31/2022 06/28/22   Zenia Resides, MD  doxycycline (VIBRAMYCIN) 100 MG capsule Take 1 capsule (100 mg total) by mouth 2 (two) times daily. 03/05/23   Raspet, Erin K, PA-C  ipratropium (ATROVENT) 0.03 % nasal spray Place 2 sprays into both nostrils every 12 (twelve) hours. 01/03/23   [name], PA-C  levonorgestrel-ethinyl estradiol (SEASONALE) 0.15-0.03 MG tablet TAKE 1 TABLET BY MOUTH DAILY 02/13/23   Babs Sciara, MD  lidocaine (XYLOCAINE) 2 % solution Swallow 5mL every 6 hours as needed for sore throat 01/03/23   [name], PA-C  nitrofurantoin, macrocrystal-monohydrate, (MACROBID) 100 MG capsule Take 1 capsule (100 mg total) by mouth 2 (two) times daily. 12/13/22   Burnette, Alessandra Bevels, PA-C  OZEMPIC, 1 MG/DOSE, 4 MG/3ML SOPN Inject 1 mg into the skin every 7 (seven) days. 08/18/22   [provider]  predniSONE (DELTASONE) 20 MG tablet Take 2 tablets (40 mg total) by mouth daily with breakfast. 01/09/23   [name], PA-C  promethazine-dextromethorphan (PROMETHAZINE-DM) 6.25-15 MG/5ML syrup Take 5 mLs by mouth 4 (four) times daily as needed for cough. 03/05/23   Raspet, Noberto Retort, PA-C      Allergies    Depo-provera [medroxyprogesterone acetate]    Review of Systems   Review of Systems  Gastrointestinal:  Positive for vomiting.  All other systems reviewed and are negative.   Physical Exam Updated Vital Signs BP 116/86 (BP Location: Right Arm)   Pulse (!) 116   Temp 99 F (37.2 C) (Oral)   Resp 18   Ht 5\' 2"  (1.575 m)   Wt 91.6 kg   SpO2 98%   BMI 36.95 kg/m  Physical Exam Vitals and nursing note reviewed.  Constitutional:      Appearance: She is well-developed.  HENT:     Head: Normocephalic.  Cardiovascular:     Rate and  Rhythm: Normal rate.  Pulmonary:     Effort: Pulmonary effort is normal.  Abdominal:     General: There is no distension.  Musculoskeletal:        General: Normal range of motion.     Cervical back: Normal range of motion.  Skin:    General: Skin is warm.  Neurological:     General: No focal deficit present.     Mental Status: She is alert and oriented to person, place, and time.     ED Results / Procedures / Treatments   Labs (all labs ordered are listed, but only abnormal results are displayed) Labs Reviewed  RESP PANEL BY RT-PCR (RSV, FLU A&B, COVID)  RVPGX2    EKG None  Radiology No results  found.  Procedures Procedures    Medications Ordered in ED Medications  ondansetron (ZOFRAN-ODT) disintegrating tablet 4 mg (4 mg Oral Given 04/07/23 0902)    ED Course/ Medical Decision Making/ A&P                                 Medical Decision Making Patient complains of nausea vomiting and diarrhea  Amount and/or Complexity of Data Reviewed Labs: ordered. Decision-making details documented in ED Course.    Details: Influenza COVID and RSV are negative  Risk Prescription drug management.           Final Clinical Impression(s) / ED Diagnoses Final diagnoses:  Nausea vomiting and diarrhea    Rx / DC Orders ED Discharge Orders          Ordered    ondansetron (ZOFRAN-ODT) 4 MG disintegrating tablet  Every 8 hours PRN        04/07/23 1102           An After Visit Summary was printed and given to the patient.    Elson Areas, PA-C 04/07/23 1305    Linwood Dibbles, MD 04/09/23 [phone]

## 2023-04-13 ENCOUNTER — Encounter: Payer: Self-pay | Admitting: Family Medicine

## 2023-04-13 NOTE — Telephone Encounter (Signed)
 Nurses I would recommend for the patient to take a home pregnancy test just to be certain. If test is positive she needs to seek emergency care through gynecology or ER to be seen this week  If pregnancy test is negative she may give it a few more days if still having bleeding issues by Monday recommend scheduling office visit with Charmaine or Elveria here at the office

## 2023-05-16 ENCOUNTER — Other Ambulatory Visit: Payer: Self-pay | Admitting: Family Medicine

## 2023-05-16 DIAGNOSIS — Z30011 Encounter for initial prescription of contraceptive pills: Secondary | ICD-10-CM

## 2023-05-16 MED ORDER — LEVONORGEST-ETH ESTRAD 91-DAY 0.15-0.03 MG PO TABS: 1.0000 | ORAL_TABLET | Freq: Every day | ORAL | 0 refills | Status: DC

## 2023-05-17 ENCOUNTER — Other Ambulatory Visit: Payer: Self-pay | Admitting: Family Medicine

## 2023-05-17 DIAGNOSIS — Z30011 Encounter for initial prescription of contraceptive pills: Secondary | ICD-10-CM

## 2023-08-22 ENCOUNTER — Encounter: Payer: Self-pay | Admitting: Family Medicine

## 2023-08-22 DIAGNOSIS — Z30011 Encounter for initial prescription of contraceptive pills: Secondary | ICD-10-CM

## 2023-08-23 MED ORDER — LEVONORGEST-ETH ESTRAD 91-DAY 0.15-0.03 MG PO TABS: 1.0000 | ORAL_TABLET | Freq: Every day | ORAL | 0 refills | Status: DC

## 2023-09-14 ENCOUNTER — Encounter: Payer: Self-pay | Admitting: Family Medicine

## 2023-09-21 ENCOUNTER — Encounter: Payer: Self-pay | Admitting: Physician Assistant

## 2023-09-21 ENCOUNTER — Ambulatory Visit: Admitting: Physician Assistant

## 2023-09-21 VITALS — BP 107/72 | HR 71 | Temp 97.9°F | Ht 62.0 in | Wt 214.0 lb

## 2023-09-21 DIAGNOSIS — R599 Enlarged lymph nodes, unspecified: Secondary | ICD-10-CM | POA: Diagnosis not present

## 2023-09-21 NOTE — Assessment & Plan Note (Signed)
 Symptoms improving. Very small palpable mastoid lymph node behind right ear. Non tender. Patient appears well. Reassurance given and supportive care discussed.

## 2023-09-21 NOTE — Progress Notes (Signed)
   Acute Office Visit  Subjective:     Patient ID: Jody Hansen, female    DOB: June 02, 1995, 28 y.o.   MRN: [id number]   Patient presents today to follow up on enlarged lymph node behind her right ear. Patient states about 1 month ago, first noticed lymph node. She had Dr. Alphonsa evaluate her concerns at one of her sons appointments. He recommended follow up in 1 month. Today, patient reports lymph node has decreased in size and is not bothersome. She denies fevers or systemic symptoms. She reports eating and drinking per usual and states she feels well.     Review of Systems  Constitutional:  Negative for fever, malaise/fatigue and weight loss.  HENT:  Negative for ear discharge, ear pain and sore throat.   Gastrointestinal:  Negative for nausea and vomiting.  Musculoskeletal:  Negative for myalgias.        Objective:     BP 107/72   Pulse 71   Temp 97.9 F (36.6 C)   Ht 5' 2 (1.575 m)   Wt 214 lb (97.1 kg)   SpO2 100%   BMI 39.14 kg/m   Physical Exam Constitutional:      Appearance: Normal appearance.  HENT:     Head: Normocephalic.     Mouth/Throat:     Mouth: Mucous membranes are moist.     Pharynx: Oropharynx is clear.  Eyes:     Extraocular Movements: Extraocular movements intact.     Conjunctiva/sclera: Conjunctivae normal.  Cardiovascular:     Rate and Rhythm: Normal rate and regular rhythm.     Heart sounds: Normal heart sounds. No murmur heard. Pulmonary:     Effort: Pulmonary effort is normal.     Breath sounds: Normal breath sounds. No wheezing or rales.  Skin:    General: Skin is warm and dry.  Neurological:     General: No focal deficit present.     Mental Status: She is alert and oriented to person, place, and time.  Psychiatric:        Mood and Affect: Mood normal.        Behavior: Behavior normal.     No results found for any visits on 09/21/23.      Assessment & Plan:  Enlarged lymph node Assessment & Plan: Symptoms  improving. Very small palpable mastoid lymph node behind right ear. Non tender. Patient appears well. Reassurance given and supportive care discussed.     Return if symptoms worsen or fail to improve.  Charmaine Laporchia Nakajima, PA-C

## 2023-10-30 DIAGNOSIS — R0602 Shortness of breath: Secondary | ICD-10-CM | POA: Diagnosis not present

## 2023-10-30 DIAGNOSIS — R5383 Other fatigue: Secondary | ICD-10-CM | POA: Diagnosis not present

## 2023-10-30 DIAGNOSIS — Z131 Encounter for screening for diabetes mellitus: Secondary | ICD-10-CM | POA: Diagnosis not present

## 2023-10-30 DIAGNOSIS — Z6841 Body Mass Index (BMI) 40.0 and over, adult: Secondary | ICD-10-CM | POA: Diagnosis not present

## 2023-10-30 DIAGNOSIS — Z3200 Encounter for pregnancy test, result unknown: Secondary | ICD-10-CM | POA: Diagnosis not present

## 2023-11-17 ENCOUNTER — Encounter: Payer: Self-pay | Admitting: Family Medicine

## 2023-11-17 ENCOUNTER — Other Ambulatory Visit: Payer: Self-pay | Admitting: Nurse Practitioner

## 2023-11-17 DIAGNOSIS — Z30011 Encounter for initial prescription of contraceptive pills: Secondary | ICD-10-CM

## 2023-11-17 MED ORDER — LEVONORGEST-ETH ESTRAD 91-DAY 0.15-0.03 MG PO TABS: 1.0000 | ORAL_TABLET | Freq: Every day | ORAL | 3 refills | Status: AC

## 2023-11-24 DIAGNOSIS — R110 Nausea: Secondary | ICD-10-CM | POA: Diagnosis not present

## 2023-11-24 DIAGNOSIS — J069 Acute upper respiratory infection, unspecified: Secondary | ICD-10-CM | POA: Diagnosis not present

## 2023-11-24 DIAGNOSIS — R059 Cough, unspecified: Secondary | ICD-10-CM | POA: Diagnosis not present

## 2023-11-24 DIAGNOSIS — R0981 Nasal congestion: Secondary | ICD-10-CM | POA: Diagnosis not present

## 2023-11-29 DIAGNOSIS — Z6841 Body Mass Index (BMI) 40.0 and over, adult: Secondary | ICD-10-CM | POA: Diagnosis not present

## 2023-12-15 ENCOUNTER — Other Ambulatory Visit: Payer: Self-pay | Admitting: Medical Genetics

## 2023-12-15 DIAGNOSIS — Z006 Encounter for examination for normal comparison and control in clinical research program: Secondary | ICD-10-CM

## 2023-12-28 DIAGNOSIS — Z6841 Body Mass Index (BMI) 40.0 and over, adult: Secondary | ICD-10-CM | POA: Diagnosis not present

## 2024-01-13 LAB — GENECONNECT MOLECULAR SCREEN: Genetic Analysis Overall Interpretation: NEGATIVE

## 2024-01-30 ENCOUNTER — Encounter: Payer: Self-pay | Admitting: Family Medicine

## 2024-02-09 DIAGNOSIS — S90122A Contusion of left lesser toe(s) without damage to nail, initial encounter: Secondary | ICD-10-CM | POA: Diagnosis not present

## 2024-02-09 DIAGNOSIS — M79672 Pain in left foot: Secondary | ICD-10-CM | POA: Diagnosis not present

## 2024-02-13 ENCOUNTER — Encounter: Payer: Self-pay | Admitting: Family Medicine

## 2024-02-13 NOTE — Telephone Encounter (Signed)
 A letter has been dictated Please make sure that a copy this is signed by myself and given to the mother.  Hopefully that will suffice with this FMLA but it would not surprise me if her HR department wants to be sticklers they may come up with something additional that needs to be done-if so please have mom let us  know and we will be more than happy to help

## 2024-02-14 NOTE — Telephone Encounter (Signed)
 Nurses I did do a letter Please print the letter and make sure a copy this is signed by me Please a possible print off most recent FMLA so I can reduce some of the portions of it  Thanks

## 2024-02-14 NOTE — Telephone Encounter (Signed)
 Nurses see previous message

## 2024-02-22 NOTE — Telephone Encounter (Signed)
 Nurses-this is quite amazing what her employer puts her through  Please forward the FMLA and highlight the area to where the box was not checked  We certainly look forward to completing this to her employer satisfaction so that this issue will not be a ongoing one for the patient  Thanks-Dr. Glendia

## 2024-02-23 NOTE — Telephone Encounter (Signed)
 Fellow staff  Please print off this FMLA that was faxed back to us  please place on my desk If for some reason nothing was faxed please print the most recent FMLA that I filled out and placed on my desk Thanks-Dr. Glendia

## 2024-02-26 NOTE — Telephone Encounter (Signed)
 Addendum was filled out I checked Suboxone No. 10 Hopefully this meets their criteria If not I am sure they will let the patient know who in turn will let us  know

## 2024-02-27 NOTE — Telephone Encounter (Signed)
 Front office Please read print the FMLA Also reprint the letter that I did Forwarded to my inbox and we will re date everything  Hopefully that will help resolve this issue for the next year Thanks-Dr. Glendia

## 2024-03-03 NOTE — Telephone Encounter (Signed)
 Front staff Please reprint her FMLA so I can make addendum's and dated for the date that she is requesting Please forward the reprint to my inbox and I will sign and date appropriately Hopefully this will complete this process   (Unfortunately she cannot help the fact that her FMLA process with her company is continuingly adding additional steps for us  to complete)

## 2024-03-07 NOTE — Telephone Encounter (Signed)
 Front staff I completed the FMLA I dated it appropriately This is being forwarded to you all You can connect with Stephana so that she can direct you how to handle it from there thank you

## 2024-03-17 ENCOUNTER — Telehealth: Payer: Self-pay | Admitting: Family Medicine

## 2024-03-17 ENCOUNTER — Encounter: Payer: Self-pay | Admitting: Family Medicine

## 2024-03-17 NOTE — Telephone Encounter (Signed)
 Front staff I have redictated the letter Please print this off and I will sign it and the mother can pick this up or you can send it to her I have also rewritten the portion of the FMLA that was sent back.  If any other portion needs to be filled out then I will need a completely new FMLA to fill out.  Please make sure that all of these documents are scanned into the system as previous FMLA's are not available in the media  Finally if additional information is needed then our office manager Clotilda will need to help connect directly with the patient and see what other needs have to be put into the FMLA, have that portion filled out, then sent to me for signature.  Essentially I have done everything that I can possibly do with this situation  As for the amount of hours of FMLA that a person can take in a given year that is purely between them and their employer and their union-I will not be putting hours into a statement.  Instead I put the potential days off per week and the number per month and that is as far as I can do.  Thanks-Dr. Glendia

## 2024-03-17 NOTE — Telephone Encounter (Signed)
 Front staff I have redictated the letter Please print this off and I will sign it and the mother can pick this up or you can send it to her I have also rewritten the portion of the FMLA that was sent back.  If any other portion needs to be filled out then I will need a completely new FMLA to fill out.  Please make sure that all of these documents are scanned into the system Finally if additional information is needed then our office manager Clotilda will need to help connect directly with the patient and see what other needs have to be put into the FMLA, have that portion filled out, then sent to me for signature.  Essentially I have done everything that I can possibly do with this situation  (As for the amount of hours of FMLA that a person can take in a given year that is purely between them and their employer and their union-I will not be putting hours into a statement.  Instead I put the potential days off per week and the number per month and that is as far as I can do.)  Thanks-Dr. Glendia

## 2024-04-09 ENCOUNTER — Encounter: Admitting: Nurse Practitioner

## 2024-04-09 ENCOUNTER — Ambulatory Visit: Admitting: Nurse Practitioner

## 2024-04-09 VITALS — BP 118/84 | HR 81 | Temp 98.2°F | Ht 62.0 in | Wt 214.6 lb

## 2024-04-09 DIAGNOSIS — Z124 Encounter for screening for malignant neoplasm of cervix: Secondary | ICD-10-CM

## 2024-04-09 DIAGNOSIS — N760 Acute vaginitis: Secondary | ICD-10-CM | POA: Diagnosis not present

## 2024-04-09 DIAGNOSIS — Z01411 Encounter for gynecological examination (general) (routine) with abnormal findings: Secondary | ICD-10-CM

## 2024-04-09 DIAGNOSIS — Z1159 Encounter for screening for other viral diseases: Secondary | ICD-10-CM

## 2024-04-09 DIAGNOSIS — R5383 Other fatigue: Secondary | ICD-10-CM | POA: Diagnosis not present

## 2024-04-09 DIAGNOSIS — Z13220 Encounter for screening for lipoid disorders: Secondary | ICD-10-CM

## 2024-04-09 DIAGNOSIS — Z01419 Encounter for gynecological examination (general) (routine) without abnormal findings: Secondary | ICD-10-CM

## 2024-04-09 DIAGNOSIS — Z1151 Encounter for screening for human papillomavirus (HPV): Secondary | ICD-10-CM

## 2024-04-09 DIAGNOSIS — B9689 Other specified bacterial agents as the cause of diseases classified elsewhere: Secondary | ICD-10-CM | POA: Diagnosis not present

## 2024-04-09 LAB — POCT URINALYSIS DIP (CLINITEK)
Bilirubin, UA: NEGATIVE
Blood, UA: NEGATIVE
Glucose, UA: NEGATIVE mg/dL
Ketones, POC UA: NEGATIVE mg/dL
Nitrite, UA: NEGATIVE
POC PROTEIN,UA: NEGATIVE
Spec Grav, UA: >=1.03 — AB
Urobilinogen, UA: NEGATIVE U/dL — AB
pH, UA: 7

## 2024-04-09 MED ORDER — METRONIDAZOLE 500 MG PO TABS: 500.0000 mg | ORAL_TABLET | Freq: Two times a day (BID) | ORAL | 0 refills | Status: AC

## 2024-04-09 NOTE — Patient Instructions (Signed)
 Labs will be ordered  Vaginal Infection (Bacterial Vaginosis): What to Know  Bacterial vaginosis is an infection of the vagina. It happens when the balance of normal germs (bacteria) in the vagina changes. It's common among females ages 65 to 99. If left untreated, it can increase your risk of getting a sexually transmitted infection (STI). If you're pregnant, you need to get treated right away. This infection can cause a baby to be born early or at a low birth weight. What are the causes? This happens when too many harmful germs grow in the vagina. The exact reason why this happens isn't known. You can't get this infection from toilet seats, bedding, swimming pools, or contact with objects around you. What increases the risk? Having new or multiple sexual partners, or unprotected sex. Douching. Using an intrauterine device (IUD). Smoking. Alcohol  and drug abuse. Taking certain antibiotics. Being pregnant. You can get a vaginal infection without being sexually active. However, it most often occurs in sexually active females. What are the signs or symptoms? Some females have no symptoms. If you have symptoms, they may include: Jody Hansen or white vaginal discharge. It can be watery or foamy. A fish-like smell, especially after sex or during your menstrual period. Itching in and around the vagina. Burning or pain with peeing. How is this diagnosed? This infection is diagnosed based on: Your medical history. A physical exam of the vagina. Checking a sample of vaginal fluid for harmful bacteria or uncommon cells. How is this treated? This condition is treated with antibiotics. These may be given as: A pill. A cream for your vagina. A medicine that you put into your vagina called a suppository. If the infection comes back, you may need more antibiotics. Follow these instructions at home: Medicines Take your medicines only as told. Take or apply your antibiotics as told. Do not stop using  them even if you start to feel better. General instructions If you have a female sexual partner, tell her about the infection. She should see her health care provider. Female partners don't need treatment. Avoid sex until treatment is complete. Drink more fluids as told. Keep the area around your vagina and rectum clean. Wash the area daily with warm water. Wipe yourself from front to back after pooping. If you're breastfeeding, talk to your provider about continuing during treatment. How is this prevented? Self-care Do not douche or use vaginal deodorant sprays. Douching can upset the balance of good and harmful bacteria in the vagina, which can cause an infection to happen again. Wear cotton or cotton-lined underwear. Avoid wearing tight pants or pantyhose, especially in the summer. Safe sex Use condoms correctly and every time you have sex. Use dental dams to protect yourself during oral sex. Limit the number of sexual partners. Get tested for STIs. Your sexual partner should also get tested. Drugs and alcohol  Do not smoke, vape, or use nicotine or tobacco. Do not use drugs. Limit the amount of alcohol  you drink because it can lead to risky sexual behavior. Where to find more information To learn more: Go to tonerpromos.no. Click Health Topics A-Z. Type bacterial vaginosis in the search box. American Sexual Health Association (ASHA): ashasexualhealth.org U.S. Department of Health and Health And Safety Inspector, Office on Women's Health: travellesson.ca Contact a health care provider if: Your symptoms don't get better, even after treatment. You have more discharge or pain when peeing. You have a fever or chills. You have pain in your belly or pelvis. You have pain during sex. You have  vaginal bleeding between menstrual periods. This information is not intended to replace advice given to you by your health care provider. Make sure you discuss any questions you have with your health care  provider. Document Revised: 08/10/2022 Document Reviewed: 08/10/2022 Elsevier Patient Education  2024 [employer].

## 2024-04-10 ENCOUNTER — Encounter: Payer: Self-pay | Admitting: Nurse Practitioner

## 2024-04-10 LAB — POCT WET PREP WITH KOH: Trichomonas, UA: NEGATIVE

## 2024-04-11 LAB — IGP, APTIMA HPV: HPV Aptima: NEGATIVE
